# Patient Record
Sex: Male | Born: 1950
Health system: Southern US, Community
[De-identification: ages and names within clinical notes are randomized; demographics above are authoritative.]

## PROBLEM LIST (undated history)

## (undated) DIAGNOSIS — E291 Testicular hypofunction: Principal | ICD-10-CM

## (undated) DIAGNOSIS — E785 Hyperlipidemia, unspecified: Secondary | ICD-10-CM

## (undated) DIAGNOSIS — K635 Polyp of colon: Secondary | ICD-10-CM

## (undated) DIAGNOSIS — H409 Unspecified glaucoma: Secondary | ICD-10-CM

## (undated) DIAGNOSIS — T7840XA Allergy, unspecified, initial encounter: Secondary | ICD-10-CM

## (undated) DIAGNOSIS — R011 Cardiac murmur, unspecified: Secondary | ICD-10-CM

## (undated) DIAGNOSIS — K219 Gastro-esophageal reflux disease without esophagitis: Secondary | ICD-10-CM

## (undated) DIAGNOSIS — I10 Essential (primary) hypertension: Secondary | ICD-10-CM

## (undated) HISTORY — DX: Gastro-esophageal reflux disease without esophagitis: K21.9

## (undated) HISTORY — DX: Allergy, unspecified, initial encounter: T78.40XA

## (undated) HISTORY — DX: Unspecified glaucoma: H40.9

## (undated) HISTORY — DX: Essential (primary) hypertension: I10

## (undated) HISTORY — DX: Testicular hypofunction: E29.1

## (undated) HISTORY — PX: ACHILLES TENDON REPAIR: SUR1153

## (undated) HISTORY — DX: Polyp of colon: K63.5

## (undated) HISTORY — DX: Hyperlipidemia, unspecified: E78.5

## (undated) HISTORY — DX: Cardiac murmur, unspecified: R01.1

---

## 1998-03-24 ENCOUNTER — Ambulatory Visit (HOSPITAL_COMMUNITY): Admission: RE | Admit: 1998-03-24 | Discharge: 1998-03-24 | Payer: Self-pay | Admitting: Obstetrics and Gynecology

## 1998-10-24 ENCOUNTER — Ambulatory Visit (HOSPITAL_BASED_OUTPATIENT_CLINIC_OR_DEPARTMENT_OTHER): Admission: RE | Admit: 1998-10-24 | Discharge: 1998-10-24 | Payer: Self-pay | Admitting: Urology

## 2001-11-03 ENCOUNTER — Encounter: Payer: Self-pay | Admitting: Internal Medicine

## 2004-09-14 ENCOUNTER — Encounter: Payer: Self-pay | Admitting: Internal Medicine

## 2004-10-15 LAB — HM COLONOSCOPY: HM Colonoscopy: NORMAL

## 2005-06-18 ENCOUNTER — Encounter: Admission: RE | Admit: 2005-06-18 | Discharge: 2005-06-18 | Payer: Self-pay | Admitting: Family Medicine

## 2005-07-12 ENCOUNTER — Encounter: Admission: RE | Admit: 2005-07-12 | Discharge: 2005-08-31 | Payer: Self-pay | Admitting: Family Medicine

## 2005-10-28 ENCOUNTER — Encounter: Payer: Self-pay | Admitting: Internal Medicine

## 2006-02-08 ENCOUNTER — Ambulatory Visit: Payer: Self-pay | Admitting: Family Medicine

## 2006-02-08 LAB — CONVERTED CEMR LAB
ALT: 36 units/L (ref 0–40)
AST: 28 units/L (ref 0–37)
BUN: 14 mg/dL (ref 6–23)
CO2: 32 meq/L (ref 19–32)
Chloride: 104 meq/L (ref 96–112)
Creatinine, Ser: 1.1 mg/dL (ref 0.4–1.5)
GFR calc non Af Amer: 74 mL/min
Glomerular Filtration Rate, Af Am: 89 mL/min/{1.73_m2}
Hgb A1c MFr Bld: 5.6 % (ref 4.6–6.0)
Microalb Creat Ratio: 10.1 mg/g (ref 0.0–30.0)
Microalb, Ur: 1 mg/dL (ref 0.0–1.9)
Potassium: 3.8 meq/L (ref 3.5–5.1)
VLDL: 11 mg/dL (ref 0–40)

## 2006-09-13 DIAGNOSIS — I10 Essential (primary) hypertension: Secondary | ICD-10-CM | POA: Insufficient documentation

## 2006-09-13 DIAGNOSIS — J309 Allergic rhinitis, unspecified: Secondary | ICD-10-CM

## 2006-09-13 DIAGNOSIS — E119 Type 2 diabetes mellitus without complications: Secondary | ICD-10-CM

## 2007-02-01 ENCOUNTER — Telehealth (INDEPENDENT_AMBULATORY_CARE_PROVIDER_SITE_OTHER): Payer: Self-pay | Admitting: *Deleted

## 2007-02-16 ENCOUNTER — Telehealth (INDEPENDENT_AMBULATORY_CARE_PROVIDER_SITE_OTHER): Payer: Self-pay | Admitting: *Deleted

## 2007-02-20 ENCOUNTER — Telehealth (INDEPENDENT_AMBULATORY_CARE_PROVIDER_SITE_OTHER): Payer: Self-pay | Admitting: *Deleted

## 2007-03-09 ENCOUNTER — Telehealth (INDEPENDENT_AMBULATORY_CARE_PROVIDER_SITE_OTHER): Payer: Self-pay | Admitting: *Deleted

## 2007-03-13 ENCOUNTER — Telehealth (INDEPENDENT_AMBULATORY_CARE_PROVIDER_SITE_OTHER): Payer: Self-pay | Admitting: *Deleted

## 2007-04-06 ENCOUNTER — Telehealth (INDEPENDENT_AMBULATORY_CARE_PROVIDER_SITE_OTHER): Payer: Self-pay | Admitting: *Deleted

## 2007-04-11 ENCOUNTER — Telehealth (INDEPENDENT_AMBULATORY_CARE_PROVIDER_SITE_OTHER): Payer: Self-pay | Admitting: *Deleted

## 2007-04-12 ENCOUNTER — Telehealth (INDEPENDENT_AMBULATORY_CARE_PROVIDER_SITE_OTHER): Payer: Self-pay | Admitting: *Deleted

## 2007-05-03 ENCOUNTER — Telehealth (INDEPENDENT_AMBULATORY_CARE_PROVIDER_SITE_OTHER): Payer: Self-pay | Admitting: *Deleted

## 2007-05-08 ENCOUNTER — Telehealth (INDEPENDENT_AMBULATORY_CARE_PROVIDER_SITE_OTHER): Payer: Self-pay | Admitting: *Deleted

## 2007-05-09 ENCOUNTER — Encounter: Payer: Self-pay | Admitting: Internal Medicine

## 2007-05-17 ENCOUNTER — Telehealth (INDEPENDENT_AMBULATORY_CARE_PROVIDER_SITE_OTHER): Payer: Self-pay | Admitting: *Deleted

## 2007-06-02 ENCOUNTER — Telehealth (INDEPENDENT_AMBULATORY_CARE_PROVIDER_SITE_OTHER): Payer: Self-pay | Admitting: *Deleted

## 2007-06-08 ENCOUNTER — Telehealth (INDEPENDENT_AMBULATORY_CARE_PROVIDER_SITE_OTHER): Payer: Self-pay | Admitting: *Deleted

## 2007-06-09 ENCOUNTER — Telehealth (INDEPENDENT_AMBULATORY_CARE_PROVIDER_SITE_OTHER): Payer: Self-pay | Admitting: *Deleted

## 2007-06-09 ENCOUNTER — Encounter: Payer: Self-pay | Admitting: Internal Medicine

## 2007-06-13 ENCOUNTER — Encounter: Payer: Self-pay | Admitting: Internal Medicine

## 2007-06-14 ENCOUNTER — Telehealth (INDEPENDENT_AMBULATORY_CARE_PROVIDER_SITE_OTHER): Payer: Self-pay | Admitting: *Deleted

## 2007-06-21 ENCOUNTER — Telehealth (INDEPENDENT_AMBULATORY_CARE_PROVIDER_SITE_OTHER): Payer: Self-pay | Admitting: *Deleted

## 2007-07-19 ENCOUNTER — Encounter: Payer: Self-pay | Admitting: Internal Medicine

## 2007-08-02 ENCOUNTER — Telehealth (INDEPENDENT_AMBULATORY_CARE_PROVIDER_SITE_OTHER): Payer: Self-pay | Admitting: *Deleted

## 2007-09-04 ENCOUNTER — Encounter: Payer: Self-pay | Admitting: Internal Medicine

## 2007-11-09 ENCOUNTER — Ambulatory Visit: Payer: Self-pay | Admitting: Internal Medicine

## 2007-11-09 DIAGNOSIS — Z860100 Personal history of colon polyps, unspecified: Secondary | ICD-10-CM | POA: Insufficient documentation

## 2007-11-09 DIAGNOSIS — E785 Hyperlipidemia, unspecified: Secondary | ICD-10-CM

## 2007-11-09 LAB — CONVERTED CEMR LAB
Basophils Absolute: 0 10*3/uL (ref 0.0–0.1)
Basophils Relative: 1.2 % (ref 0.0–3.0)
CO2: 31 meq/L (ref 19–32)
Calcium: 9.5 mg/dL (ref 8.4–10.5)
Chloride: 100 meq/L (ref 96–112)
Creatinine,U: 78.4 mg/dL
Glucose, Bld: 93 mg/dL (ref 70–99)
HCT: 42.3 % (ref 39.0–52.0)
Hemoglobin: 14.7 g/dL (ref 13.0–17.0)
Hgb A1c MFr Bld: 5.8 % (ref 4.6–6.0)
Lymphocytes Relative: 28.9 % (ref 12.0–46.0)
MCHC: 34.9 g/dL (ref 30.0–36.0)
Neutro Abs: 2.3 10*3/uL (ref 1.4–7.7)
Platelets: 184 10*3/uL (ref 150–400)
RDW: 13.3 % (ref 11.5–14.6)
VLDL: 12 mg/dL (ref 0–40)

## 2007-11-10 ENCOUNTER — Encounter: Payer: Self-pay | Admitting: Internal Medicine

## 2007-11-10 ENCOUNTER — Telehealth: Payer: Self-pay | Admitting: Internal Medicine

## 2007-12-11 ENCOUNTER — Ambulatory Visit: Payer: Self-pay | Admitting: Internal Medicine

## 2007-12-11 DIAGNOSIS — E876 Hypokalemia: Secondary | ICD-10-CM

## 2007-12-11 DIAGNOSIS — M79609 Pain in unspecified limb: Secondary | ICD-10-CM | POA: Insufficient documentation

## 2007-12-11 LAB — CONVERTED CEMR LAB
Calcium: 9.3 mg/dL (ref 8.4–10.5)
Chloride: 106 meq/L (ref 96–112)
GFR calc non Af Amer: 82 mL/min
Glucose, Bld: 126 mg/dL — ABNORMAL HIGH (ref 70–99)
Potassium: 3.7 meq/L (ref 3.5–5.1)
Sodium: 144 meq/L (ref 135–145)

## 2007-12-12 ENCOUNTER — Telehealth: Payer: Self-pay | Admitting: Internal Medicine

## 2007-12-25 ENCOUNTER — Encounter: Payer: Self-pay | Admitting: Internal Medicine

## 2007-12-29 ENCOUNTER — Encounter: Payer: Self-pay | Admitting: Internal Medicine

## 2008-01-05 ENCOUNTER — Telehealth: Payer: Self-pay | Admitting: Internal Medicine

## 2008-01-12 ENCOUNTER — Telehealth: Payer: Self-pay | Admitting: Internal Medicine

## 2008-01-13 ENCOUNTER — Ambulatory Visit: Payer: Self-pay | Admitting: Family Medicine

## 2008-01-13 DIAGNOSIS — J069 Acute upper respiratory infection, unspecified: Secondary | ICD-10-CM | POA: Insufficient documentation

## 2008-01-16 ENCOUNTER — Ambulatory Visit (HOSPITAL_BASED_OUTPATIENT_CLINIC_OR_DEPARTMENT_OTHER): Admission: RE | Admit: 2008-01-16 | Discharge: 2008-01-16 | Payer: Self-pay | Admitting: Internal Medicine

## 2008-01-16 ENCOUNTER — Telehealth: Payer: Self-pay | Admitting: Internal Medicine

## 2008-01-16 ENCOUNTER — Ambulatory Visit: Payer: Self-pay | Admitting: Internal Medicine

## 2008-01-16 DIAGNOSIS — R079 Chest pain, unspecified: Secondary | ICD-10-CM

## 2008-01-22 ENCOUNTER — Ambulatory Visit: Payer: Self-pay | Admitting: Internal Medicine

## 2008-02-19 ENCOUNTER — Ambulatory Visit: Payer: Self-pay | Admitting: Internal Medicine

## 2008-02-19 ENCOUNTER — Inpatient Hospital Stay (HOSPITAL_COMMUNITY): Admission: EM | Admit: 2008-02-19 | Discharge: 2008-02-21 | Payer: Self-pay | Admitting: Emergency Medicine

## 2008-02-28 ENCOUNTER — Ambulatory Visit: Payer: Self-pay | Admitting: Internal Medicine

## 2008-02-28 DIAGNOSIS — N453 Epididymo-orchitis: Secondary | ICD-10-CM | POA: Insufficient documentation

## 2008-03-01 ENCOUNTER — Telehealth: Payer: Self-pay | Admitting: Internal Medicine

## 2008-03-05 ENCOUNTER — Ambulatory Visit: Payer: Self-pay | Admitting: Internal Medicine

## 2008-03-13 ENCOUNTER — Telehealth (INDEPENDENT_AMBULATORY_CARE_PROVIDER_SITE_OTHER): Payer: Self-pay | Admitting: *Deleted

## 2008-03-14 ENCOUNTER — Ambulatory Visit: Payer: Self-pay | Admitting: Internal Medicine

## 2008-03-14 DIAGNOSIS — J329 Chronic sinusitis, unspecified: Secondary | ICD-10-CM | POA: Insufficient documentation

## 2008-04-01 ENCOUNTER — Telehealth: Payer: Self-pay | Admitting: Internal Medicine

## 2008-04-02 ENCOUNTER — Ambulatory Visit: Payer: Self-pay | Admitting: Internal Medicine

## 2008-04-02 DIAGNOSIS — M542 Cervicalgia: Secondary | ICD-10-CM | POA: Insufficient documentation

## 2008-04-29 ENCOUNTER — Ambulatory Visit: Payer: Self-pay | Admitting: Internal Medicine

## 2008-04-29 LAB — CONVERTED CEMR LAB
CO2: 31 meq/L (ref 19–32)
Calcium: 9.6 mg/dL (ref 8.4–10.5)
Chloride: 101 meq/L (ref 96–112)
Creatinine, Ser: 0.8 mg/dL (ref 0.4–1.5)
Creatinine,U: 63.4 mg/dL
GFR calc Af Amer: 128 mL/min
Glucose, Bld: 185 mg/dL — ABNORMAL HIGH (ref 70–99)
Hgb A1c MFr Bld: 7 % — ABNORMAL HIGH (ref 4.6–6.0)
Microalb Creat Ratio: 11 mg/g (ref 0.0–30.0)

## 2008-05-02 ENCOUNTER — Ambulatory Visit: Payer: Self-pay | Admitting: Internal Medicine

## 2008-07-19 ENCOUNTER — Telehealth: Payer: Self-pay | Admitting: Internal Medicine

## 2008-07-29 ENCOUNTER — Ambulatory Visit: Payer: Self-pay | Admitting: Internal Medicine

## 2008-07-29 DIAGNOSIS — R05 Cough: Secondary | ICD-10-CM

## 2008-09-26 ENCOUNTER — Ambulatory Visit: Payer: Self-pay | Admitting: Internal Medicine

## 2008-09-26 LAB — CONVERTED CEMR LAB
ALT: 28 units/L (ref 0–53)
Chloride: 105 meq/L (ref 96–112)
Cholesterol: 145 mg/dL (ref 0–200)
Creatinine, Ser: 0.9 mg/dL (ref 0.4–1.5)
Hgb A1c MFr Bld: 6.4 % (ref 4.6–6.5)
Sodium: 143 meq/L (ref 135–145)
Total CHOL/HDL Ratio: 3
Triglycerides: 91 mg/dL (ref 0.0–149.0)

## 2008-09-30 ENCOUNTER — Ambulatory Visit: Payer: Self-pay | Admitting: Internal Medicine

## 2009-01-08 ENCOUNTER — Telehealth: Payer: Self-pay | Admitting: Internal Medicine

## 2009-01-09 ENCOUNTER — Ambulatory Visit: Payer: Self-pay | Admitting: Internal Medicine

## 2009-01-09 DIAGNOSIS — J209 Acute bronchitis, unspecified: Secondary | ICD-10-CM

## 2009-03-24 IMAGING — CR DG CHEST 2V
2 series · 2 of 2 positions shown · non-contrast
Comparison: None

CLINICAL DATA: Cough.  Chest pain.  Upper respiratory infection.

CHEST - 2 VIEW

[w chest pa]
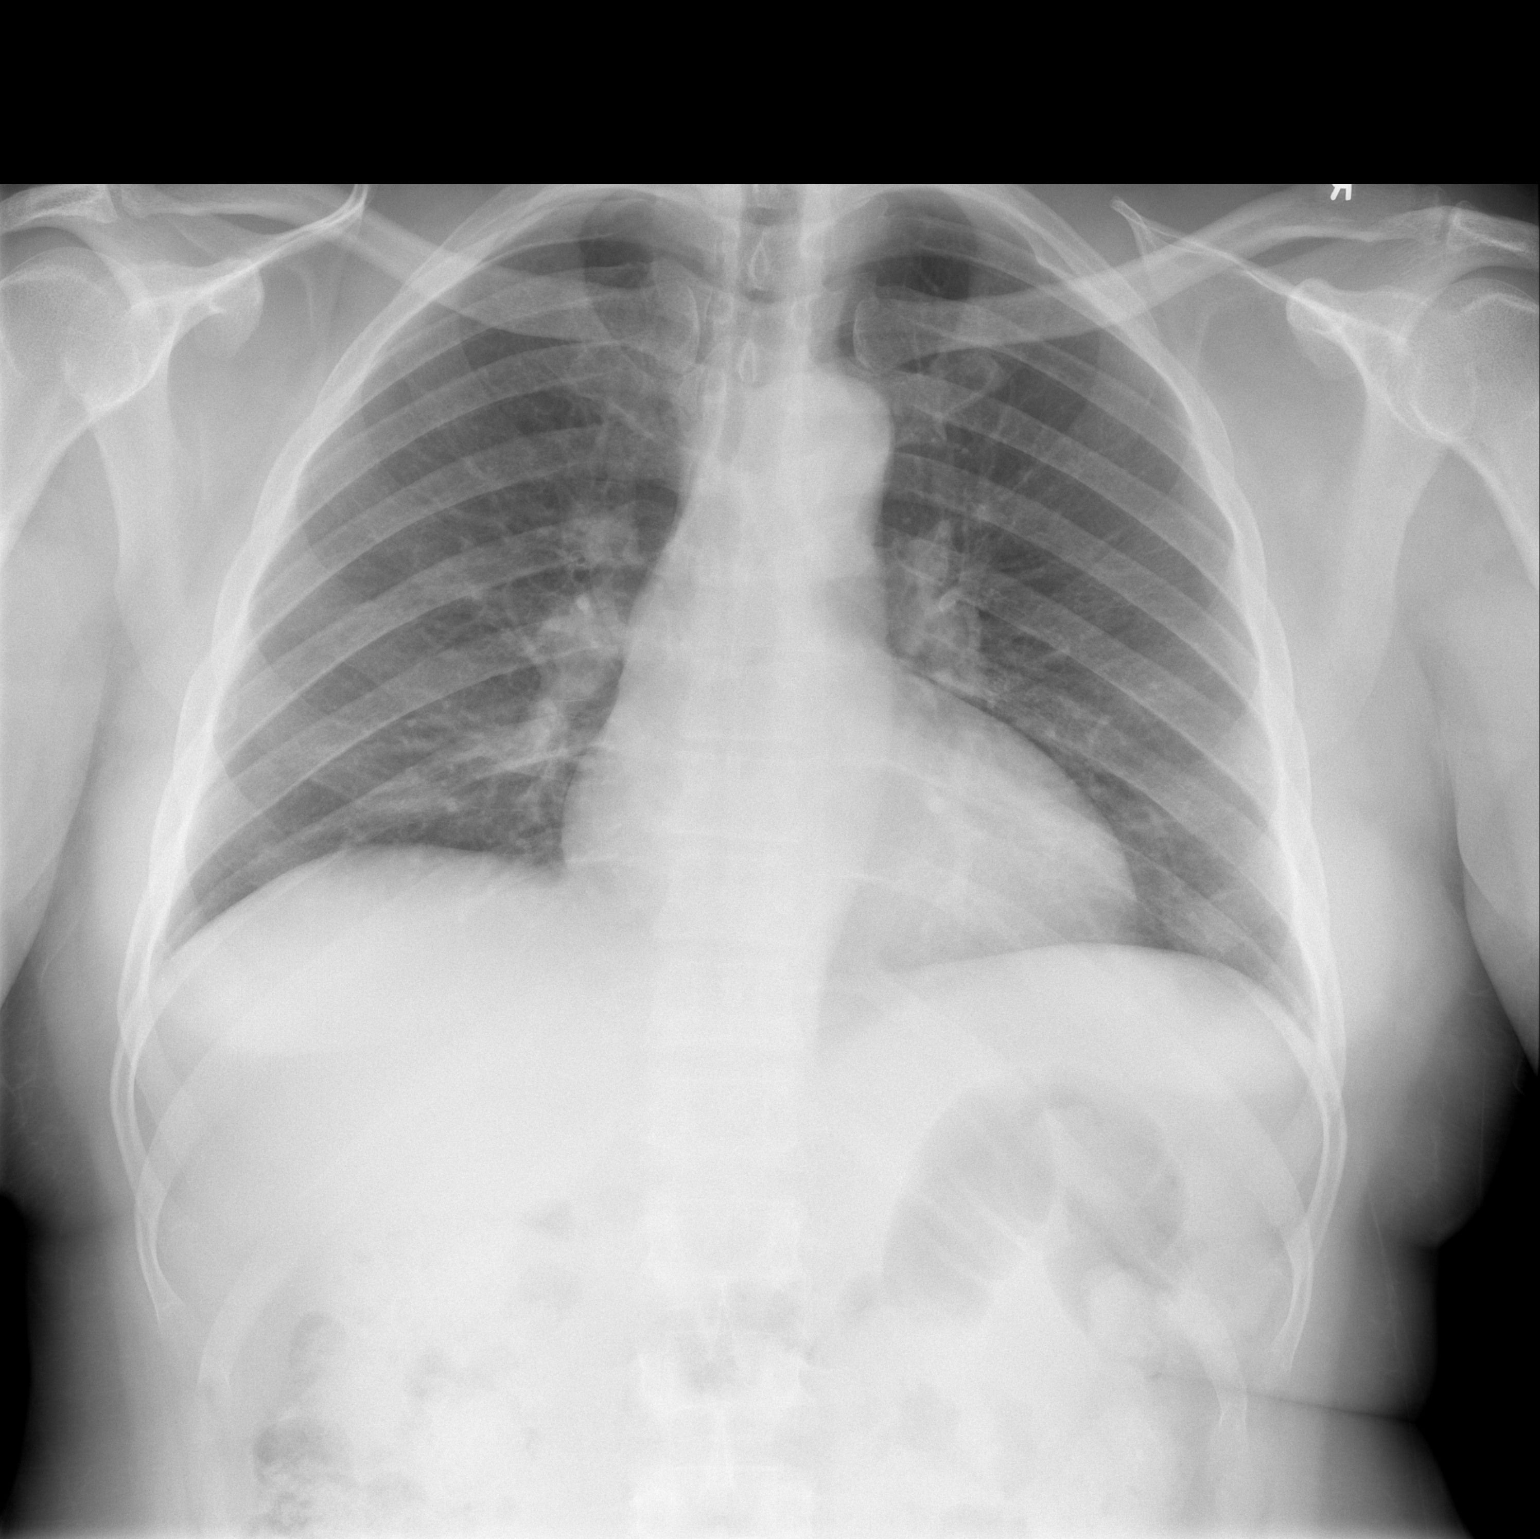

[w chest lat]
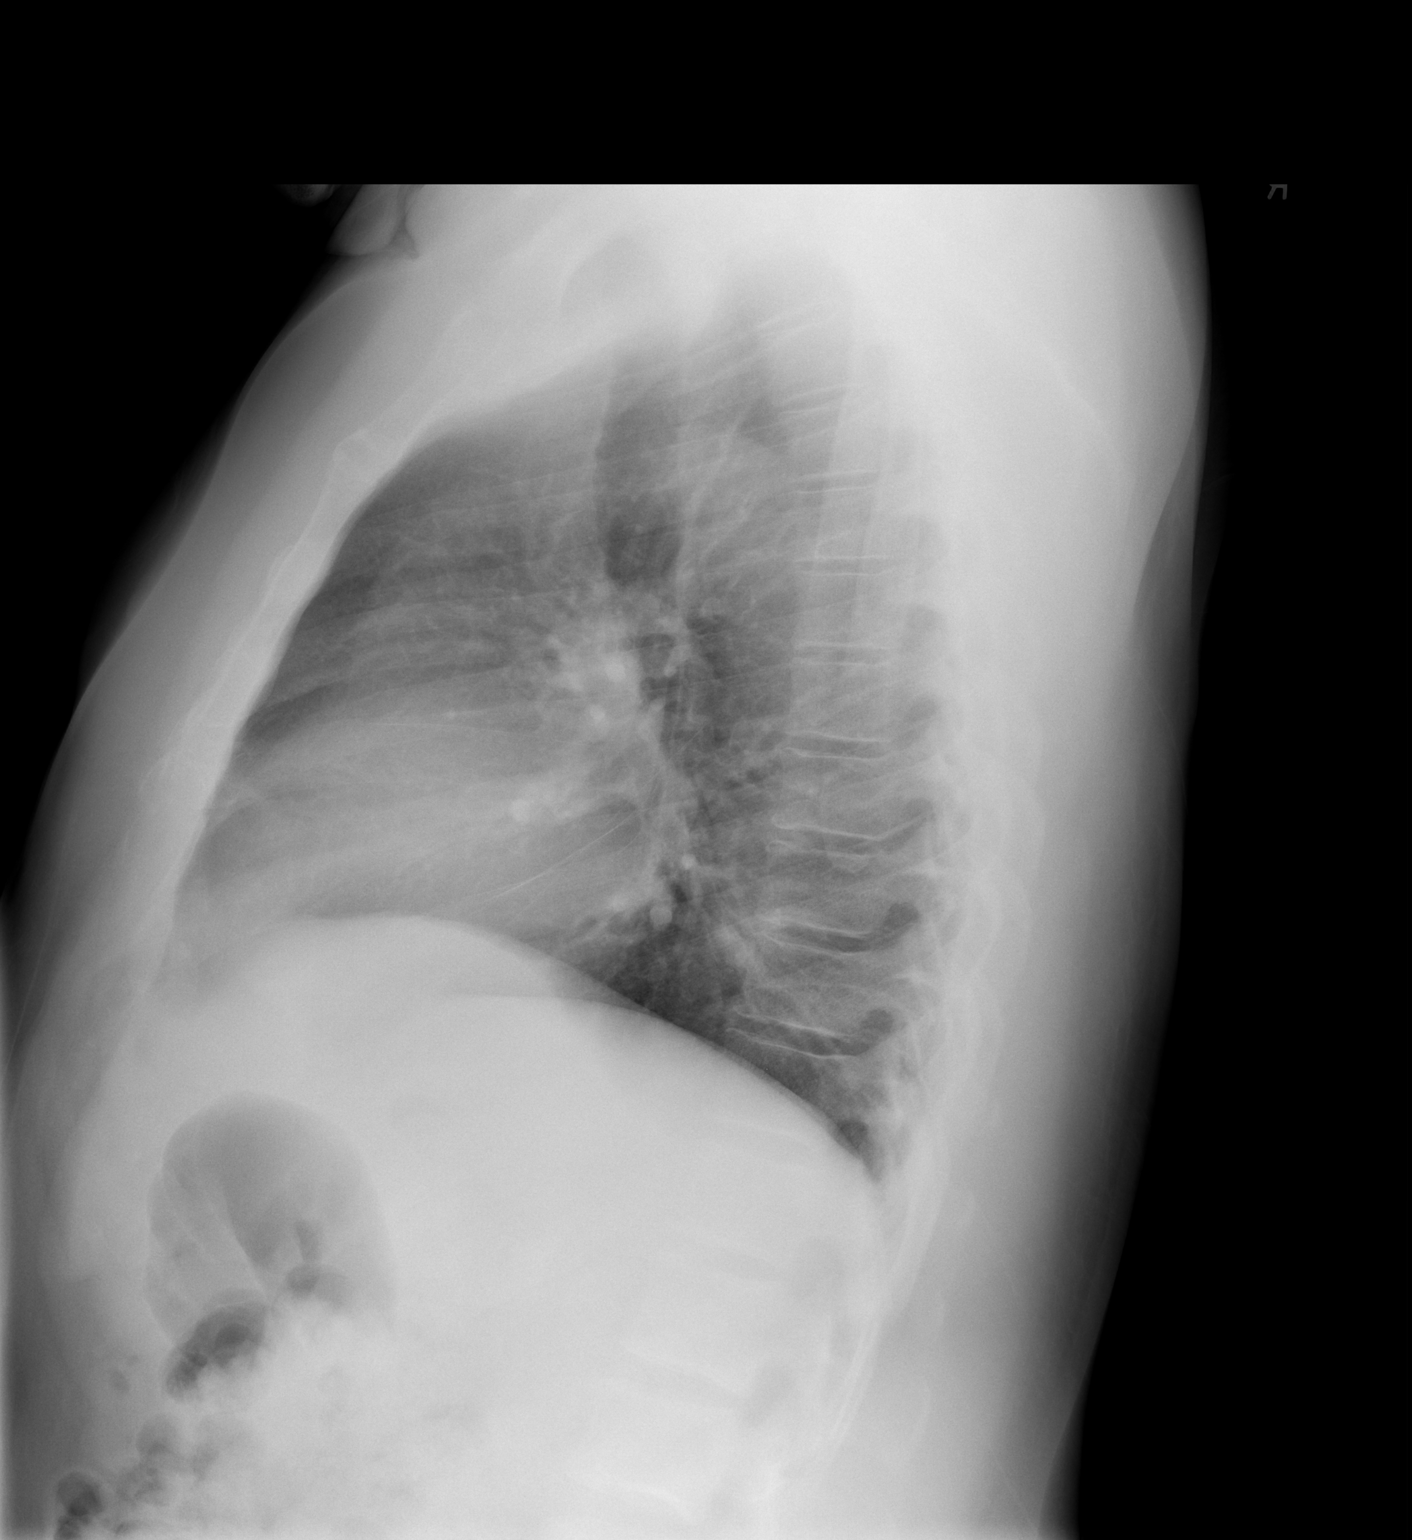

[2 of 2 positions shown; findings below may reference images not displayed]

FINDINGS: Low lung volumes are noted, however both lungs are clear.
Heart size is within normal limits.  There is no evidence of
pleural effusion.  No mass or adenopathy identified.  Visualized
skeletal structures are unremarkable.
IMPRESSION: No active cardiopulmonary disease.

## 2009-03-25 ENCOUNTER — Ambulatory Visit: Payer: Self-pay | Admitting: Internal Medicine

## 2009-03-25 LAB — CONVERTED CEMR LAB
BUN: 15 mg/dL (ref 6–23)
Creatinine, Ser: 0.97 mg/dL (ref 0.40–1.50)
Hgb A1c MFr Bld: 13.3 % — ABNORMAL HIGH (ref 4.6–6.1)
Potassium: 3.7 meq/L (ref 3.5–5.3)

## 2009-04-07 ENCOUNTER — Ambulatory Visit: Payer: Self-pay | Admitting: Internal Medicine

## 2009-04-07 LAB — CONVERTED CEMR LAB: Blood Glucose, Fingerstick: 371

## 2009-05-02 ENCOUNTER — Telehealth: Payer: Self-pay | Admitting: Internal Medicine

## 2009-05-09 ENCOUNTER — Ambulatory Visit: Payer: Self-pay | Admitting: Internal Medicine

## 2009-06-06 ENCOUNTER — Telehealth: Payer: Self-pay | Admitting: Internal Medicine

## 2009-06-06 ENCOUNTER — Ambulatory Visit: Payer: Self-pay | Admitting: Internal Medicine

## 2009-06-06 LAB — CONVERTED CEMR LAB
BUN: 19 mg/dL (ref 6–23)
CO2: 24 meq/L (ref 19–32)
Chloride: 104 meq/L (ref 96–112)
Creatinine, Ser: 0.89 mg/dL (ref 0.40–1.50)
Creatinine, Urine: 84.5 mg/dL
Microalb Creat Ratio: 18.5 mg/g (ref 0.0–30.0)
Sodium: 144 meq/L (ref 135–145)

## 2009-07-11 ENCOUNTER — Ambulatory Visit: Payer: Self-pay | Admitting: Internal Medicine

## 2009-07-22 ENCOUNTER — Telehealth: Payer: Self-pay | Admitting: Internal Medicine

## 2009-08-22 ENCOUNTER — Telehealth: Payer: Self-pay | Admitting: Internal Medicine

## 2009-09-23 ENCOUNTER — Telehealth: Payer: Self-pay | Admitting: Internal Medicine

## 2009-10-20 ENCOUNTER — Telehealth: Payer: Self-pay | Admitting: Internal Medicine

## 2009-10-23 ENCOUNTER — Telehealth: Payer: Self-pay | Admitting: Internal Medicine

## 2009-10-31 ENCOUNTER — Ambulatory Visit: Payer: Self-pay | Admitting: Internal Medicine

## 2009-10-31 LAB — CONVERTED CEMR LAB
CO2: 28 meq/L (ref 19–32)
Calcium: 9.3 mg/dL (ref 8.4–10.5)
Glucose, Bld: 106 mg/dL — ABNORMAL HIGH (ref 70–99)
Hgb A1c MFr Bld: 5.9 % — ABNORMAL HIGH (ref <5.7)

## 2009-11-07 ENCOUNTER — Ambulatory Visit: Payer: Self-pay | Admitting: Internal Medicine

## 2009-11-27 ENCOUNTER — Encounter: Payer: Self-pay | Admitting: Internal Medicine

## 2009-12-26 ENCOUNTER — Telehealth: Payer: Self-pay | Admitting: Internal Medicine

## 2010-01-13 ENCOUNTER — Telehealth: Payer: Self-pay | Admitting: Internal Medicine

## 2010-01-16 ENCOUNTER — Telehealth: Payer: Self-pay | Admitting: Internal Medicine

## 2010-02-11 ENCOUNTER — Encounter: Payer: Self-pay | Admitting: Internal Medicine

## 2010-02-11 LAB — HM DIABETES EYE EXAM: HM Diabetic Eye Exam: NORMAL

## 2010-02-19 ENCOUNTER — Encounter: Payer: Self-pay | Admitting: Internal Medicine

## 2010-02-19 ENCOUNTER — Telehealth: Payer: Self-pay | Admitting: Internal Medicine

## 2010-02-23 ENCOUNTER — Encounter: Payer: Self-pay | Admitting: Internal Medicine

## 2010-04-16 ENCOUNTER — Telehealth: Payer: Self-pay | Admitting: Internal Medicine

## 2010-05-06 ENCOUNTER — Encounter: Payer: Self-pay | Admitting: Internal Medicine

## 2010-05-06 LAB — CONVERTED CEMR LAB
CO2: 26 meq/L (ref 19–32)
Calcium: 10 mg/dL (ref 8.4–10.5)
Chloride: 105 meq/L (ref 96–112)
Creatinine, Ser: 1 mg/dL (ref 0.40–1.50)
Glucose, Bld: 120 mg/dL — ABNORMAL HIGH (ref 70–99)
Hgb A1c MFr Bld: 6.6 % — ABNORMAL HIGH (ref <5.7)

## 2010-05-08 ENCOUNTER — Ambulatory Visit
Admission: RE | Admit: 2010-05-08 | Discharge: 2010-05-08 | Payer: Self-pay | Source: Home / Self Care | Attending: Internal Medicine | Admitting: Internal Medicine

## 2010-05-11 ENCOUNTER — Encounter: Payer: Self-pay | Admitting: Internal Medicine

## 2010-05-11 ENCOUNTER — Telehealth: Payer: Self-pay | Admitting: Internal Medicine

## 2010-05-22 ENCOUNTER — Telehealth: Payer: Self-pay | Admitting: Internal Medicine

## 2010-05-25 ENCOUNTER — Telehealth: Payer: Self-pay | Admitting: Internal Medicine

## 2010-05-28 NOTE — Progress Notes (Signed)
Summary: pharmacy clarification  Phone Note Outgoing Call   Call placed by: Mervin Kung, CMA (AAMA) Call placed to: Patient Summary of Call: Left message for pt to return my call.  Received refill request from [facility] for Potassium. Refill sent to [facility] on 11/07/09 #90 x 3 refills.  Which pharmacy does pt want to use? Need to cancel refills at [facility] if he wants to use [facility]. Nicki Guadalajara Fergerson CMA Duncan Dull)  December 26, 2009 8:41 AM   Follow-up for Phone Call        Pt returned my call and verified that he wants to get rx at [facility]. Refills sent to [facility].  Spoke to [place] at [facility] and cancelled rx.  Nicki Guadalajara Fergerson CMA Duncan Dull)  December 26, 2009 8:49 AM        Appended Document: pharmacy clarification Rx called to [facility] @ [facility]. #90 x 1 rf.

## 2010-05-28 NOTE — Progress Notes (Signed)
Summary: Actos samples   Phone Note Call from Patient   Caller: Patient Call For: D. Thomos Lemons DO Summary of Call: patient needs some samples of actos  [phone] Initial call taken by: Roselle Locus,  April 16, 2010 11:22 AM  Follow-up for Phone Call        call returned to patient at [phone], no answer. A detailied voice message was left informing patient samples left at front desk for patient pick up Follow-up by: Glendell Docker CMA,  April 16, 2010 11:30 AM    New/Updated Medications: ACTOS 45 MG TABS (PIOGLITAZONE HCL) Take 1 tablet by mouth once a day Prescriptions: ACTOS 45 MG TABS (PIOGLITAZONE HCL) Take 1 tablet by mouth once a day  #30 x 0   Entered by:   Glendell Docker CMA   Authorized by:   D. Thomos Lemons DO   Signed by:   Glendell Docker CMA on 04/16/2010   Method used:   Samples Given   RxID:   [id number]

## 2010-05-28 NOTE — Miscellaneous (Signed)
Summary: diabetic eye exam  Clinical Lists Changes  Observations: Added new observation of DMEYEEXAMNXT: 02/2011 (02/19/2010 13:42) Added new observation of DMEYEEXMRES: normal (02/11/2010 13:43) Added new observation of EYE EXAM BY: [facility], Dr. Annett Gula (02/11/2010 13:43) Added new observation of DIAB EYE EX: normal (02/11/2010 13:43)        Diabetes Management Exam:    Eye Exam:       Eye Exam done elsewhere          Date: 02/11/2010          Results: normal          Done by: [facility], Dr. Annett Gula

## 2010-05-28 NOTE — Progress Notes (Signed)
Summary: Elevated blood sugars  Phone Note Call from Patient Call back at [facility] Phone [phone]   Caller: Patient Call For: D. Thomos Lemons DO Summary of Call: patient called and left voice message stating his blood sugars have been elevated ranging in the 130's. He would like to know if his blood sugars are okay where they are at or if he should restart the Glimepiride Initial call taken by: Glendell Docker CMA,  January 16, 2010 4:26 PM  Follow-up for Phone Call        no,  cont other meds,  low carb diet and exercise if blood sugars continue to rise,  instruct pt call back in 2 wks Follow-up by: D. Thomos Lemons DO,  January 18, 2010 8:03 PM  Additional Follow-up for Phone Call Additional follow up Details #1::        call returned to patient at [phone], no answer. A detailed voice message was left informing patient per Dr Artist Pais instructions Additional Follow-up by: Glendell Docker CMA,  January 19, 2010 8:43 AM

## 2010-05-28 NOTE — Progress Notes (Signed)
Summary: Metformin Refill & Lab Orders  Phone Note Refill Request Message from:  Fax from Pharmacy on Sep 23, 2009 10:03 AM  Refills Requested: Medication #1:  METFORMIN HCL 500 MG XR24H-TAB 2 by mouth two times a day   Dosage confirmed as above?Dosage Confirmed   Brand Name Necessary? No   Supply Requested: 3 months   Last Refilled: 08/06/2009  Method Requested: Electronic Next Appointment Scheduled: 11/07/2009 @ 3:45P Initial call taken by: Glendell Docker CMA,  Sep 23, 2009 10:03 AM  Follow-up for Phone Call        Rx completed in Dr. Tiajuana Amass Follow-up by: Glendell Docker CMA,  Sep 23, 2009 10:04 AM    Prescriptions: METFORMIN HCL 500 MG XR24H-TAB (METFORMIN HCL) 2 by mouth two times a day  #360 x 3   Entered by:   Glendell Docker CMA   Authorized by:   D. Thomos Lemons DO   Signed by:   Glendell Docker CMA on 09/23/2009   Method used:   Electronically to        [facility] [address] (retail)       [address]       [place], [place]  [address]       Ph: [phone]       Fax: [phone]   RxID:   [id number]

## 2010-05-28 NOTE — Assessment & Plan Note (Signed)
Summary: 6 month follow up/mhf   Vital Signs:  Patient profile:   60 year old male Height:      64 inches Weight:      197 pounds BMI:     33.94 O2 Sat:      100 % on Room air Temp:     97.5 degrees F oral Pulse rate:   55 / minute Resp:     18 per minute BP sitting:   132 / 80  (left arm) Cuff size:   large  Vitals Entered By: Glendell Docker CMA (May 08, 2010 3:51 PM)  O2 Flow:  Room air CC: 6 Month Follow up , Type 2 diabetes mellitus follow-up Is Patient Diabetic? Yes Pain Assessment Patient in pain? no      Comments low blood sugar 108, high 144, avg 120's ,    Primary Care Provider:  Dondra Spry DO  CC:  6 Month Follow up  and Type 2 diabetes mellitus follow-up.  History of Present Illness:  Hypertension Follow-Up      This is a 60 year old man who presents for Hypertension follow-up.  The patient reports edema, but denies lightheadedness.  The patient denies the following associated symptoms: chest pain and chest pressure.  Compliance with medications (by patient report) has been near 100%.  The patient reports that dietary compliance has been fair.    Type 2 Diabetes Mellitus Follow-Up      The patient is also here for Type 2 diabetes mellitus follow-up.  The patient reports weight gain, but denies self managed hypoglycemia and hypoglycemia requiring help.  The patient denies the following symptoms: chest pain.  Since the last visit the patient reports monitoring blood glucose.  less exercise.  diet worse over holidays   Preventive Screening-Counseling & Management  Alcohol-Tobacco     Smoking Status: never  Allergies (verified): No Known Drug Allergies  Past History:  Past Medical History: Allergic rhinitis Diabetes mellitus, type II    Hyperlipidemia    Colonic polyps, hx of     Left epididymitis/orchitis with left hydrocele 01/2008      Family History: Mother deceased 39's - seizure Father deceased ? medical hx Brother - stomach  cancer Colon cancer - no             Social History: Occupation:  unemployed Married -  since 1999 no children  Never Smoked   Alcohol use-no   Drug use-no            Physical Exam  General:  alert, well-developed, and well-nourished.   Neck:  supple, no masses, and no neck tenderness.   Lungs:  normal respiratory effort and normal breath sounds.   Heart:  normal rate, regular rhythm, and no gallop.   Extremities:  trace left pedal edema and trace right pedal edema.    Diabetes Management Exam:    Foot Exam (with socks and/or shoes not present):       Inspection:          Left foot: normal          Right foot: normal   Impression & Recommendations:  Problem # 1:  DIABETES MELLITUS, TYPE II (ICD-250.00) Assessment Deteriorated fluid retention with Actos.  DC actos  due to concerns for higher risk for bladder cancer switch to tradjenta  The following medications were removed from the medication list:    Actos 45 Mg Tabs (Pioglitazone hcl) .Marland Kitchen... Take 1 tablet by mouth once a day  His updated medication list for this problem includes:    Lisinopril-hydrochlorothiazide 20-25 Mg Tabs (Lisinopril-hydrochlorothiazide) ..... One by mouth once daily    Metformin Hcl 500 Mg Xr24h-tab (Metformin hcl) .Marland Kitchen... 2 by mouth two times a day    Aspirin Low Dose 81 Mg Tabs (Aspirin) .Marland Kitchen... Take 1 tablet by mouth once a day    Tradjenta 5 Mg Tabs (Linagliptin) ..... One by mouth once daily  Labs Reviewed: Creat: 1.00 (05/06/2010)     Last Eye Exam: normal (02/11/2010) Reviewed HgBA1c results: 6.6 (05/06/2010)  5.9 (10/31/2009)  Problem # 2:  HYPERTENSION (ICD-401.9) Assessment: Unchanged  His updated medication list for this problem includes:    Lisinopril-hydrochlorothiazide 20-25 Mg Tabs (Lisinopril-hydrochlorothiazide) ..... One by mouth once daily    Labetalol Hcl 200 Mg Tabs (Labetalol hcl) .Marland Kitchen... Take one tablet by mouth twice daily    Dilt-cd 240 Mg Xr24h-cap (Diltiazem hcl  coated beads) ..... One by mouth qd    Catapres 0.1 Mg Tabs (Clonidine hcl) ..... One tab by mouth two times a day  BP today: 132/80 Prior BP: 130/80 (11/07/2009)  Labs Reviewed: K+: 3.8 (05/06/2010) Creat: : 1.00 (05/06/2010)   Chol: 145 (09/26/2008)   HDL: 44.00 (09/26/2008)   LDL: 83 (09/26/2008)   TG: 91.0 (09/26/2008)  Complete Medication List: 1)  Lisinopril-hydrochlorothiazide 20-25 Mg Tabs (Lisinopril-hydrochlorothiazide) .... One by mouth once daily 2)  Metformin Hcl 500 Mg Xr24h-tab (Metformin hcl) .... 2 by mouth two times a day 3)  Labetalol Hcl 200 Mg Tabs (Labetalol hcl) .... Take one tablet by mouth twice daily 4)  Zocor 40 Mg Tabs (Simvastatin) .... Take one half  tablet daily 5)  Klor-con M20 20 Meq Cr-tabs (Potassium chloride crys cr) .... One by mouth once daily 6)  Dilt-cd 240 Mg Xr24h-cap (Diltiazem hcl coated beads) .... One by mouth qd 7)  Catapres 0.1 Mg Tabs (Clonidine hcl) .... One tab by mouth two times a day 8)  Omeprazole 20 Mg Tbec (Omeprazole) .... One by mouth once daily ac am meal 9)  Aspirin Low Dose 81 Mg Tabs (Aspirin) .... Take 1 tablet by mouth once a day 10)  Zyrtec Allergy 10 Mg Tabs (Cetirizine hcl) .... One by mouth qd 11)  Nasonex 50 Mcg/act Susp (Mometasone furoate) .... 2 sprays each nostril by mouth qd 12)  Proair Hfa 108 (90 Base) Mcg/act Aers (Albuterol sulfate) .... 2 puffs q 4 - 6 hrs as needed. 13)  Accu-chek Aviva Strp (Glucose blood) .... Use three times a day as directed 14)  Accu-chek Multiclix Lancets Misc (Lancets) .... Use three times a day as directed to check blood sugar 15)  Tradjenta 5 Mg Tabs (Linagliptin) .... One by mouth once daily  Patient Instructions: 1)  Please schedule a follow-up appointment in 6  months. 2)  BMP prior to visit, ICD-9: 401.9 3)  Hepatic Panel prior to visit, ICD-9: 272.4 4)  Lipid Panel prior to visit, ICD-9: 272.4 5)  HbgA1C prior to visit, ICD-9: 250.00 6)  Urine Microalbumin prior to visit,  ICD-9: 250.00 7)  Please return for lab work one (1) week before your next appointment.  Prescriptions: TRADJENTA 5 MG TABS (LINAGLIPTIN) one by mouth once daily  #90 x 1   Entered and Authorized by:   D. Thomos Lemons DO   Signed by:   D. Thomos Lemons DO on 05/08/2010   Method used:   Electronically to        [facility] # [phone]* (retail)  [address] [place], [place]  [address]       Ph: [phone]       Fax: [phone]   RxID:   [id number]    Orders Added: 1)  Est. Patient Level III [84132]     Current Allergies (reviewed today): No known allergies

## 2010-05-28 NOTE — Progress Notes (Signed)
Summary: refill--lebetalol,clonidine,lisinopril-hctz,diltiazem  Phone Note Refill Request Call back at Home Phone [phone] Message from:  Patient on May 22, 2010 1:26 PM  Refills Requested: Medication #1:  LABETALOL HCL 200 MG TABS Take one tablet by mouth twice daily   Dosage confirmed as above?Dosage Confirmed   Brand Name Necessary? No   Supply Requested: 3 months  Medication #2:  diltiabem er240 mg take 1 per day   Dosage confirmed as above?Dosage Confirmed   Brand Name Necessary? No   Supply Requested: 3 months  Medication #3:  blonidine .1 mg take 2 per day   Dosage confirmed as above?Dosage Confirmed   Brand Name Necessary? No   Supply Requested: 3 months  Medication #4:  LISINOPRIL-HYDROCHLOROTHIAZIDE 20-25 MG TABS one by mouth once daily   Dosage confirmed as above?Dosage Confirmed   Brand Name Necessary? No   Supply Requested: 3 months [facility]  [address] [place]    Method Requested: Electronic Next Appointment Scheduled: none  Initial call taken by: Roselle Locus,  May 22, 2010 1:29 PM    Prescriptions: CATAPRES 0.1 MG  TABS (CLONIDINE HCL) one tab by mouth two times a day  #180 x 1   Entered by:   Mervin Kung CMA (AAMA)   Authorized by:   D. Thomos Lemons DO   Signed by:   Mervin Kung CMA (AAMA) on 05/22/2010   Method used:   Electronically to        [facility] # [phone]* (retail)       [address] [place], [place]  [address]       Ph: [phone]       Fax: [phone]   RxID:   [id number] DILT-CD 240 MG XR24H-CAP (DILTIAZEM HCL COATED BEADS) one by mouth qd  #90 x 1   Entered by:   Mervin Kung CMA (AAMA)   Authorized by:   D. Thomos Lemons DO   Signed by:   Mervin Kung CMA (AAMA) on 05/22/2010   Method used:   Electronically to        [facility] # [phone]* (retail)       [address] [place], [place]  [address]       Ph: [phone]    Fax: [phone]   RxID:   [id number] LABETALOL HCL 200 MG TABS (LABETALOL HCL) Take one tablet by mouth twice daily  #180 x 1   Entered by:   Mervin Kung CMA (AAMA)   Authorized by:   D. Thomos Lemons DO   Signed by:   Mervin Kung CMA (AAMA) on 05/22/2010   Method used:   Electronically to        [facility] # [phone]* (retail)       [address] [place], [place]  [address]       Ph: [phone]       Fax: [phone]   RxID:   [id number] LISINOPRIL-HYDROCHLOROTHIAZIDE 20-25 MG TABS (LISINOPRIL-HYDROCHLOROTHIAZIDE) one by mouth once daily  #90 x 1   Entered by:   Mervin Kung CMA (AAMA)   Authorized by:   D. Thomos Lemons DO   Signed by:   Mervin Kung CMA (AAMA) on 05/22/2010  Method used:   Electronically to        [facility] # [phone]* (retail)       [address] [place], [place]  [address]       Ph: [phone]       Fax: [phone]   RxID:   [id number]   Appended Document: refill--lebetalol,clonidine,lisinopril-hctz,diltiazem Detailed message left on home phone that refills were completed and to call if any questions.

## 2010-05-28 NOTE — Miscellaneous (Signed)
Summary: Lab orders for January appt  Clinical Lists Changes  Orders: Added new Test order of T-Basic Metabolic Panel ([id number]) - Signed Added new Test order of T- Hemoglobin A1C (83036-23375) - Signed 

## 2010-05-28 NOTE — Progress Notes (Signed)
Summary: Potassium Refill  Phone Note Refill Request Message from:  Fax from Pharmacy on July 22, 2009 2:14 PM  Refills Requested: Medication #1:  KLOR-CON M20 20 MEQ  CR-TABS one by mouth once daily   Dosage confirmed as above?Dosage Confirmed   Brand Name Necessary? No   Last Refilled: 05/28/2009   Notes: 5 months supply 150 qty [facility] [address] [place] [address] phone [phone] fax [phone]   Method Requested: Electronic Next Appointment Scheduled: 10-31-09 lab  Initial call taken by: Roselle Locus,  July 22, 2009 2:15 PM  Follow-up for Phone Call        calll placed to patient at [phone] to verify rx refill. Pharmacy sent a request for a 5 month supply, paitent stated he only needs a 30 day supply, he might attempt mail oder Follow-up by: Glendell Docker CMA,  July 22, 2009 2:57 PM    Prescriptions: KLOR-CON M20 20 MEQ  CR-TABS (POTASSIUM CHLORIDE CRYS CR) one by mouth once daily  #30 x 1   Entered by:   Glendell Docker CMA   Authorized by:   D. Thomos Lemons DO   Signed by:   Glendell Docker CMA on 07/22/2009   Method used:   Electronically to        [facility] # [phone]* (retail)       [address] [place], [place]  [address]       Ph: [phone]       Fax: [phone]   RxID:   [id number]

## 2010-05-28 NOTE — Assessment & Plan Note (Signed)
Summary: 3 month follow up/mhf   Vital Signs:  Patient profile:   60 year old male Height:      64 inches Weight:      192.25 pounds BMI:     33.12 O2 Sat:      99 % on Room air Temp:     97.9 degrees F oral Pulse rate:   57 / minute Pulse rhythm:   regular Resp:     56 per minute BP sitting:   130 / 80  (right arm) Cuff size:   large  Vitals Entered By: Glendell Docker CMA (November 07, 2009 3:57 PM)  O2 Flow:  Room air CC: Rm 3- 3 Month follow up disease management, Type 2 diabetes mellitus follow-up Is Patient Diabetic? Yes Did you bring your meter with you today? No Pain Assessment Patient in pain? no       Does patient need assistance? Functional Status Self care Ambulation Normal Comments low blood sugar high 123, avg 90's   Primary Care Provider:  Dondra Spry DO  CC:  Rm 3- 3 Month follow up disease management and Type 2 diabetes mellitus follow-up.  History of Present Illness:  Type 2 Diabetes Mellitus Follow-Up      This is a 60 year old man who presents for Type 2 diabetes mellitus follow-up.  The patient reports self managed hypoglycemia, but denies hypoglycemia requiring help, weight loss, and weight gain.  The patient denies the following symptoms: chest pain.  Since the last visit the patient reports good dietary compliance, compliance with medications, and monitoring blood glucose.    got new job at [employer].  it keeps him physically active nephews come over to play basketball    Allergies (verified): No Known Drug Allergies  Past History:  Past Medical History: Allergic rhinitis Diabetes mellitus, type II    Hyperlipidemia   Colonic polyps, hx of     Left epididymitis/orchitis with left hydrocele 01/2008      Family History: Mother deceased 32's - seizure Father deceased ? medical hx Brother - stomach cancer Colon cancer - no            Social History: Occupation:  Working for [employer] Married -  since 1999 no children  Never Smoked     Alcohol use-no   Drug use-no            Physical Exam  General:  alert, well-developed, and well-nourished.   Lungs:  normal respiratory effort and normal breath sounds.   Heart:  normal rate, regular rhythm, and no gallop.   Extremities:  trace left pedal edema and trace right pedal edema.   Neurologic:  cranial nerves II-XII intact and gait normal.   Psych:  normally interactive, good eye contact, not anxious appearing, and not depressed appearing.     Impression & Recommendations:  Problem # 1:  DIABETES MELLITUS, TYPE II (ICD-250.00) Assessment Improved dramatic improvement.  combination of physical activity and med compliance.  DC amaryl due to risk of hypoglycemia.  he is taking 1/2 of actos dose.    The following medications were removed from the medication list:    Amaryl 1 Mg Tabs (Glimepiride) .Marland Kitchen... Take 1 tablet by mouth once a day His updated medication list for this problem includes:    Lisinopril-hydrochlorothiazide 20-25 Mg Tabs (Lisinopril-hydrochlorothiazide) ..... One by mouth once daily    Metformin Hcl 500 Mg Xr24h-tab (Metformin hcl) .Marland Kitchen... 2 by mouth two times a day    Aspirin Low Dose  81 Mg Tabs (Aspirin) .Marland Kitchen... Take 1 tablet by mouth once a day    Actos 45 Mg Tabs (Pioglitazone hcl) ..... One by mouth once daily  Labs Reviewed: Creat: 0.99 (10/31/2009)    Reviewed HgBA1c results: 5.9 (10/31/2009)  8.4 (06/06/2009)  Problem # 2:  HYPERTENSION (ICD-401.9) stable.  Maintain current medication regimen.  His updated medication list for this problem includes:    Lisinopril-hydrochlorothiazide 20-25 Mg Tabs (Lisinopril-hydrochlorothiazide) ..... One by mouth once daily    Labetalol Hcl 200 Mg Tabs (Labetalol hcl) .Marland Kitchen... Take one tablet by mouth twice daily    Dilt-cd 240 Mg Xr24h-cap (Diltiazem hcl coated beads) ..... One by mouth qd    Catapres 0.1 Mg Tabs (Clonidine hcl) ..... One tab by mouth two times a day  BP today: 130/80 Prior BP: 114/78  (07/11/2009)  Labs Reviewed: K+: 3.6 (10/31/2009) Creat: : 0.99 (10/31/2009)   Chol: 145 (09/26/2008)   HDL: 44.00 (09/26/2008)   LDL: 83 (09/26/2008)   TG: 91.0 (09/26/2008)  Complete Medication List: 1)  Lisinopril-hydrochlorothiazide 20-25 Mg Tabs (Lisinopril-hydrochlorothiazide) .... One by mouth once daily 2)  Metformin Hcl 500 Mg Xr24h-tab (Metformin hcl) .... 2 by mouth two times a day 3)  Labetalol Hcl 200 Mg Tabs (Labetalol hcl) .... Take one tablet by mouth twice daily 4)  Zocor 40 Mg Tabs (Simvastatin) .... Take one half  tablet daily 5)  Klor-con M20 20 Meq Cr-tabs (Potassium chloride crys cr) .... One by mouth once daily 6)  Dilt-cd 240 Mg Xr24h-cap (Diltiazem hcl coated beads) .... One by mouth qd 7)  Catapres 0.1 Mg Tabs (Clonidine hcl) .... One tab by mouth two times a day 8)  Omeprazole 20 Mg Tbec (Omeprazole) .... One by mouth once daily ac am meal 9)  Aspirin Low Dose 81 Mg Tabs (Aspirin) .... Take 1 tablet by mouth once a day 10)  Zyrtec Allergy 10 Mg Tabs (Cetirizine hcl) .... One by mouth qd 11)  Nasonex 50 Mcg/act Susp (Mometasone furoate) .... 2 sprays each nostril by mouth qd 12)  Proair Hfa 108 (90 Base) Mcg/act Aers (Albuterol sulfate) .... 2 puffs q 4 - 6 hrs as needed. 13)  Accu-chek Aviva Strp (Glucose blood) .... Use three times a day as directed 14)  Accu-chek Multiclix Lancets Misc (Lancets) .... Use three times a day as directed to check blood sugar 15)  Actos 45 Mg Tabs (Pioglitazone hcl) .... One by mouth once daily  Patient Instructions: 1)  Please schedule a follow-up appointment in 6 months. 2)  BMP prior to visit, ICD-9:  401.9 3)  HbgA1C prior to visit, ICD-9:  250.00 4)  Please return for lab work one (1) week before your next appointment.  Prescriptions: ACTOS 45 MG TABS (PIOGLITAZONE HCL) one by mouth once daily  #90 x 3   Entered and Authorized by:   D. Thomos Lemons DO   Signed by:   D. Thomos Lemons DO on 11/07/2009   Method used:    Electronically to        [facility] # [id number]* (retail)       [address] [place], [place]  [address]       Ph: [phone]       Fax: [phone]   RxID:   [id number] CATAPRES 0.1 MG  TABS (CLONIDINE HCL) one tab by mouth two times a day  #180 x 3  Entered and Authorized by:   D. Thomos Lemons DO   Signed by:   D. Thomos Lemons DO on 11/07/2009   Method used:   Electronically to        [facility] # [id number]* (retail)       [address] [place], [place]  [address]       Ph: [phone]       Fax: [phone]   RxID:   [id number] KLOR-CON M20 20 MEQ  CR-TABS (POTASSIUM CHLORIDE CRYS CR) one by mouth once daily  #90 x 3   Entered and Authorized by:   D. Thomos Lemons DO   Signed by:   D. Thomos Lemons DO on 11/07/2009   Method used:   Electronically to        [facility] # [id number]* (retail)       [address] [place], [place]  [address]       Ph: [phone]       Fax: [phone]   RxID:   [id number] ZOCOR 40 MG TABS (SIMVASTATIN) Take one half  tablet daily  #90 x 3   Entered and Authorized by:   D. Thomos Lemons DO   Signed by:   D. Thomos Lemons DO on 11/07/2009   Method used:   Electronically to        [facility] # [phone] [phone]* (retail)       [address] [place], [place]  [address]       Ph: [phone]       Fax: [phone]   RxID:   [id number] LABETALOL HCL 200 MG TABS (LABETALOL HCL) Take one tablet by mouth twice daily  #180 x 3   Entered and Authorized by:   D. Thomos Lemons DO   Signed by:   D. Thomos Lemons DO on 11/07/2009   Method used:   Electronically to        [facility] # [id number]* (retail)       [address] [place], [place]  [address]       Ph: [phone]       Fax: [phone]   RxID:   [id number] LISINOPRIL-HYDROCHLOROTHIAZIDE 20-25 MG TABS (LISINOPRIL-HYDROCHLOROTHIAZIDE) one by  mouth once daily  #90 x 3   Entered and Authorized by:   D. Thomos Lemons DO   Signed by:   D. Thomos Lemons DO on 11/07/2009   Method used:   Electronically to        [facility] # [id number]* (retail)       [address] [place], [place]  [address]       Ph: [phone]       Fax: [phone]   RxID:   [id number]   Current Allergies (reviewed today): No known allergies    Preventive Care Screening  Colonoscopy:    Date:  10/15/2004    Next Due:  10/2014    Results:  normal

## 2010-05-28 NOTE — Progress Notes (Signed)
Summary: Actos Samples  Phone Note Call from Patient Call back at Work Phone [phone]   Caller: Patient Call For: D. Thomos Lemons DO Summary of Call: patient called and left voice message requesting samples of Actos and a discount card if available.   Call was returned to patient, he was informed there are no Actos samples or discount cards available at this time. He was asked if he would like to try something  else, and he stated that was done in the past, and he would prefer to stay on the Actos. He states that his wife checked with her insurance company and once the deductible has been met, then his insurance would cover the medication with a lower copay. Initial call taken by: Glendell Docker CMA,  October 20, 2009 3:42 PM

## 2010-05-28 NOTE — Letter (Signed)
Summary: [facility]  [facility]   Imported By: Lanelle Bal 03/02/2010 14:19:04  _____________________________________________________________________  External Attachment:    Type:   Image     Comment:   External Document

## 2010-05-28 NOTE — Medication Information (Signed)
Summary: Noncompliance with Clonidine & Lisinopril/[employer]  Noncompliance with Clonidine & Lisinopril/[employer]   Imported By: Lanelle Bal 03/02/2010 14:17:31  _____________________________________________________________________  External Attachment:    Type:   Image     Comment:   External Document

## 2010-05-28 NOTE — Assessment & Plan Note (Signed)
Summary: 60mo. f/u - jr   Vital Signs:  Patient profile:   60 year old male Height:      64 inches Weight:      191.25 pounds BMI:     32.95 O2 Sat:      99 % on Room air Temp:     98.0 degrees F oral Pulse rate:   67 / minute Pulse rhythm:   regular Resp:     18 per minute BP sitting:   114 / 78  (right arm) Cuff size:   large  Vitals Entered By: Glendell Docker CMA (July 11, 2009 3:45 PM)  O2 Flow:  Room air CC: Rm 2- Follow up diabetes, Type 2 diabetes mellitus follow-up   Primary Care Provider:  DThomos Lemons DO  CC:  Rm 2- Follow up diabetes and Type 2 diabetes mellitus follow-up.  History of Present Illness:  Type 2 Diabetes Mellitus Follow-Up      This is a 60 year old man who presents for Type 2 diabetes mellitus follow-up.  The patient denies self managed hypoglycemia and hypoglycemia requiring help.  The patient denies the following symptoms: chest pain.  Since the last visit the patient reports good dietary compliance, compliance with medications, and monitoring blood glucose.    low blood sugar 77 high 151 avg 120 in the mornings and the evening 80's- 90's,  Januvia copay is too high  Htn - stable  Allergies (verified): No Known Drug Allergies  Past History:  Past Medical History: Allergic rhinitis Diabetes mellitus, type II    Hyperlipidemia  Colonic polyps, hx of     Left epididymitis/orchitis with left hydrocele 01/2008      Family History: Mother deceased 24's - seizure Father deceased ? medical hx Brother - stomach cancer Colon cancer - no           Social History: Occupation:  Radiation protection practitioner - unemployed Married -  since 1999 no children  Never Smoked   Alcohol use-no   Drug use-no          Physical Exam  General:  alert, well-developed, and well-nourished.   Neck:  supple, no masses, and no neck tenderness.   Lungs:  normal respiratory effort and normal breath sounds.   Heart:  normal rate, regular rhythm, no  murmur, and no gallop.    Diabetes Management Exam:    Foot Exam (with socks and/or shoes not present):       Inspection:          Left foot: normal          Right foot: normal   Impression & Recommendations:  Problem # 1:  DIABETES MELLITUS, TYPE II (ICD-250.00) Assessment Improved A1c improved.  We discussed potential need for insulin in the future.  both actos and [place] expensive.   use actos mail order. pt encouraged to exercise.  New job with [employer] helping with overall physical activity.  The following medications were removed from the medication list:    Januvia 100 Mg Tabs (Sitagliptin phosphate) ..... One by mouth once daily His updated medication list for this problem includes:    Lisinopril-hydrochlorothiazide 20-25 Mg Tabs (Lisinopril-hydrochlorothiazide) ..... One by mouth once daily    Metformin Hcl 500 Mg Xr24h-tab (Metformin hcl) .Marland Kitchen... 2 by mouth two times a day    Amaryl 1 Mg Tabs (Glimepiride) .Marland Kitchen... Take 1 tablet by mouth once a day    Aspirin Low Dose 81 Mg Tabs (Aspirin) .Marland Kitchen... Take 1  tablet by mouth once a day    Actos 45 Mg Tabs (Pioglitazone hcl) ..... One by mouth once daily  Labs Reviewed: Creat: 0.89 (06/06/2009)    Reviewed HgBA1c results: 8.4 (06/06/2009)  13.3 (03/25/2009)  Problem # 2:  HYPERTENSION (ICD-401.9) well controlled.  Maintain current medication regimen.  His updated medication list for this problem includes:    Lisinopril-hydrochlorothiazide 20-25 Mg Tabs (Lisinopril-hydrochlorothiazide) ..... One by mouth once daily    Labetalol Hcl 200 Mg Tabs (Labetalol hcl) .Marland Kitchen... Take one tablet by mouth twice daily    Dilt-cd 240 Mg Xr24h-cap (Diltiazem hcl coated beads) ..... One by mouth qd    Catapres 0.1 Mg Tabs (Clonidine hcl) ..... One tab by mouth two times a day  BP today: 114/78 Prior BP: 130/80 (05/09/2009)  Labs Reviewed: K+: 3.6 (06/06/2009) Creat: : 0.89 (06/06/2009)   Chol: 145 (09/26/2008)   HDL: 44.00 (09/26/2008)   LDL: 83  (09/26/2008)   TG: 91.0 (09/26/2008)  Complete Medication List: 1)  Lisinopril-hydrochlorothiazide 20-25 Mg Tabs (Lisinopril-hydrochlorothiazide) .... One by mouth once daily 2)  Metformin Hcl 500 Mg Xr24h-tab (Metformin hcl) .... 2 by mouth two times a day 3)  Labetalol Hcl 200 Mg Tabs (Labetalol hcl) .... Take one tablet by mouth twice daily 4)  Zocor 40 Mg Tabs (Simvastatin) .... Take one tablet daily 5)  Klor-con M20 20 Meq Cr-tabs (Potassium chloride crys cr) .... One by mouth once daily 6)  Dilt-cd 240 Mg Xr24h-cap (Diltiazem hcl coated beads) .... One by mouth qd 7)  Catapres 0.1 Mg Tabs (Clonidine hcl) .... One tab by mouth two times a day 8)  Amaryl 1 Mg Tabs (Glimepiride) .... Take 1 tablet by mouth once a day 9)  Omeprazole 20 Mg Tbec (Omeprazole) .... One by mouth once daily ac am meal 10)  Aspirin Low Dose 81 Mg Tabs (Aspirin) .... Take 1 tablet by mouth once a day 11)  Zyrtec Allergy 10 Mg Tabs (Cetirizine hcl) .... One by mouth qd 12)  Nasonex 50 Mcg/act Susp (Mometasone furoate) .... 2 sprays each nostril by mouth qd 13)  Proair Hfa 108 (90 Base) Mcg/act Aers (Albuterol sulfate) .... 2 puffs q 4 - 6 hrs as needed. 14)  Accu-chek Aviva Strp (Glucose blood) .... Use three times a day as directed 15)  Accu-chek Multiclix Lancets Misc (Lancets) .... Use three times a day as directed 16)  Actos 45 Mg Tabs (Pioglitazone hcl) .... One by mouth once daily   Patient Instructions: 1)  Please schedule a follow-up appointment in 4 months. 2)  BMP prior to visit, ICD-9:  401.9 3)  HbgA1C prior to visit, ICD-9:  250.02 4)  Try to excercise daily 20-30 minutes. 5)  Please return for lab work one (1) week before your next appointment.  6)  Please follow up with your eye doctor for DM eye exam Prescriptions: ACTOS 45 MG TABS (PIOGLITAZONE HCL) one by mouth once daily  #90 x 3   Entered and Authorized by:   D. Thomos Lemons DO   Signed by:   D. Thomos Lemons DO on 07/11/2009   Method used:    Print then Give to Patient   RxID:   [id number]   Current Allergies (reviewed today): No known allergies

## 2010-05-28 NOTE — Progress Notes (Signed)
Summary: [facility] refill  Phone Note Refill Request Message from:  Fax from Pharmacy on August 22, 2009 4:22 PM  Refills Requested: Medication #1:  KLOR-CON M20 20 MEQ  CR-TABS one by mouth once daily Initial call taken by: Lucious Groves,  August 22, 2009 4:22 PM    Prescriptions: KLOR-CON M20 20 MEQ  CR-TABS (POTASSIUM CHLORIDE CRYS CR) one by mouth once daily  #30 x 3   Entered by:   Lucious Groves   Authorized by:   D. Thomos Lemons DO   Signed by:   Lucious Groves on 08/22/2009   Method used:   Electronically to        [facility] (retail)       [address]       [place], [place]  [address]       Ph: [phone]       Fax: [phone]   RxID:   [id number]

## 2010-05-28 NOTE — Progress Notes (Signed)
Summary: refill --Simvastatin  Phone Note Refill Request Message from:  Pharmacy on January 13, 2010 2:15 PM  Refills Requested: Medication #1:  simvastatin 40 mg tab   Dosage confirmed as above?Dosage Confirmed   Brand Name Necessary? No   Supply Requested: 1 month   Last Ref[facility]: [facility] [address] [place] [address] phone [phone] fax [phone]   Method Requested: Electronic Next Appointment Scheduled: 05-08-2010 [address]  Initial call taken by: Roselle Locus,  January 13, 2010 2:16 PM    Prescriptions: ZOCOR 40 MG TABS (SIMVASTATIN) Take one half  tablet daily  #45 x 1   Entered by:   Mervin Kung CMA (AAMA)   Authorized by:   D. Thomos Lemons DO   Signed by:   Mervin Kung CMA (AAMA) on 01/13/2010   Method used:   Electronically to        [facility] (retail)       [address]       [place], [place]  [address]       Ph: [phone]       Fax: [phone]   RxID:   [id number]

## 2010-05-28 NOTE — Progress Notes (Addendum)
Summary: Prior Keith Hardin  Phone Note Outgoing Call   Call placed by: Chestine Spore Call placed to: Insurer Details for Reason: Prior Auth   Tradjenta  Summary of Call: Prior Auth for  Tradjenta 5mg   form requested /[employer] Initial call taken by: Darral Dash,  May 11, 2010 8:53 AM  Follow-up for Phone Call        Form received and forwarded to Provider for signature. Nicki Guadalajara Fergerson CMA Duncan Dull)  May 11, 2010 3:54 PM   Additional Follow-up for Phone Call Additional follow up Details #1::        form faxed to [employer] at [phone], awaiting approval status Additional Follow-up by: Glendell Docker CMA,  May 13, 2010 4:36 PM     Appended Document: Prior Auth Trajenta Received notice from [place] that pt must have had failure or intolerance to 1 formulary alternative such as: Januvia, Janumet, Kombiglyze XR or Onglyza.  Please advise.

## 2010-05-28 NOTE — Progress Notes (Signed)
Summary: Actos samples  Phone Note Call from Patient   Caller: Patient Call For: D. Thomos Lemons DO Summary of Call: Pt called requesting samples of Actos. Left message on machine for pt to return my call. Pt currently takes 45mg . We have 30mg  and 15mg , pt will need to take one of each strength daily. Samples have been left at the front desk. Nicki Guadalajara Fergerson CMA Duncan Dull)  February 19, 2010 2:51 PM   Follow-up for Phone Call        Spoke to pt, he has already picked up samples and is aware to take one of each strength daily.   Nicki Guadalajara Fergerson CMA Duncan Dull)  February 20, 2010 12:29 PM     Prescriptions: ACTOS 45 MG TABS (PIOGLITAZONE HCL) one by mouth once daily  #0 x 0   Entered by:   Mervin Kung CMA (AAMA)   Authorized by:   D. Thomos Lemons DO   Signed by:   Mervin Kung CMA (AAMA) on 02/19/2010   Method used:   Samples Given   RxID:   [id number]  Actos 15mg  [id number] Exp 03/2012 x 4 boxes Actos 30mg  [id number] Exp 03/2012 x 4 boxes   Mervin Kung CMA Duncan Dull)  February 19, 2010 2:54 PM

## 2010-05-28 NOTE — Progress Notes (Signed)
Summary: wants to pick up januvia samples while here for labs  Phone Note Call from Patient   Caller: Patient Call For: D. Thomos Lemons DO Summary of Call: Pt. is coming today for Labs and wanted to know if he can pick up some Januvia samples while he is here for labs today Initial call taken by: Michaelle Copas,  June 06, 2009 11:28 AM  Follow-up for Phone Call        ok for samples if avail Follow-up by: D. Thomos Lemons DO,  June 06, 2009 3:09 PM  Additional Follow-up for Phone Call Additional follow up Details #1::        Handed pt. the Januvia samples. Thank you.Michaelle Copas  June 06, 2009 3:50 PM

## 2010-05-28 NOTE — Progress Notes (Signed)
Summary: needs 30mg  Actos samples  Phone Note Call from Patient   Caller: Patient Call For: D. Thomos Lemons DO Reason for Call: Talk to Nurse Summary of Call: pt. would like to know if he can come by and pick up Actos samples 30mg .... call pt. back and let him know, [phone] Initial call taken by: Michaelle Copas,  May 02, 2009 10:19 AM  Follow-up for Phone Call        ok to give pt samples if avail Follow-up by: D. Thomos Lemons DO,  May 02, 2009 12:57 PM  Additional Follow-up for Phone Call Additional follow up Details #1::        patient advised per Dr Artist Pais, samples provided. Left at front desk for patient pick up Additional Follow-up by: Glendell Docker CMA,  May 02, 2009 2:08 PM

## 2010-05-28 NOTE — Assessment & Plan Note (Signed)
Summary: 1 mon f/u/hea   Vital Signs:  Patient profile:   60 year old male Weight:      197.25 pounds BMI:     33.98 O2 Sat:      99 % on Room air Temp:     97.8 degrees F oral Pulse rate:   58 / minute Pulse rhythm:   regular Resp:     18 per minute BP sitting:   130 / 80  (right arm) Cuff size:   large  Vitals Entered By: Glendell Docker CMA (May 09, 2009 4:11 PM)  O2 Flow:  Room air  Primary Care Provider:  D. Thomos Lemons DO  CC:  1 Month Follow  and Type 2 diabetes mellitus follow-up.  History of Present Illness: 1 Month Follow up Diabetes management  Type 2 Diabetes Mellitus Follow-Up      This is a 60 year old man who presents for Type 2 diabetes mellitus follow-up.  The patient reports self managed hypoglycemia, but denies hypoglycemia requiring help.  The patient denies the following symptoms: chest pain.  Since the last visit the patient reports good dietary compliance, compliance with medications, exercising regularly, and monitoring blood glucose.  low blood sugar 65 high  161 avg 140.  low blood sugar assoc with more than usual execise.   Allergies (verified): No Known Drug Allergies  Past History:  Past Medical History: Allergic rhinitis Diabetes mellitus, type II    Hyperlipidemia  Colonic polyps, hx of    Left epididymitis/orchitis with left hydrocele 01/2008      Family History: Mother deceased 28's - seizure Father deceased ? 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   Neck:  supple, no masses, and no neck tenderness.   Lungs:  normal respiratory effort and normal breath sounds.   Heart:  normal rate, regular rhythm, and no gallop.     Impression & Recommendations:  Problem # 1:   DIABETES MELLITUS, TYPE II (ICD-250.00) AM blood sugars much better.  Actos cost prohibitive.  switch to actos.  much better dietary compliance.  he is also exercising.  A1c before next OV.  Pt advised to not take amaryl on days he exercises to avoid hypoglycemia The following medications were removed from the medication list:    Actos 30 Mg Tabs (Pioglitazone hcl) ..... One by mouth once daily His updated medication list for this problem includes:    Lisinopril-hydrochlorothiazide 20-25 Mg Tabs (Lisinopril-hydrochlorothiazide) ..... One by mouth once daily    Metformin Hcl 500 Mg Xr24h-tab (Metformin hcl) .Marland Kitchen... 2 by mouth two times a day    Amaryl 1 Mg Tabs (Glimepiride) .Marland Kitchen... Take 1 tablet by mouth once a day    Aspirin Low Dose 81 Mg Tabs (Aspirin) .Marland Kitchen... Take 1 tablet by mouth once a day    Januvia 100 Mg Tabs (Sitagliptin phosphate) ..... One by mouth once daily  Labs Reviewed: Creat: 0.97 (03/25/2009)     Complete Medication List: 1)  Lisinopril-hydrochlorothiazide 20-25 Mg Tabs (Lisinopril-hydrochlorothiazide) .... One by mouth once daily 2)  Metformin Hcl 500 Mg Xr24h-tab (Metformin hcl) .... 2 by mouth two times a day 3)  Labetalol Hcl 200 Mg Tabs (  Labetalol hcl) .... Take one tablet by mouth twice daily 4)  Zocor 40 Mg Tabs (Simvastatin) .... Take one tablet daily 5)  Klor-con M20 20 Meq Cr-tabs (Potassium chloride crys cr) .... One by mouth once daily 6)  Dilt-cd 240 Mg Xr24h-cap (Diltiazem hcl coated beads) .... One by mouth qd 7)  Catapres 0.1 Mg Tabs (Clonidine hcl) .... One tab by mouth two times a day 8)  Amaryl 1 Mg Tabs (Glimepiride) .... Take 1 tablet by mouth once a day 9)  Omeprazole 20 Mg Tbec (Omeprazole) .... One by mouth once daily ac am meal 10)  Aspirin Low Dose 81 Mg Tabs (Aspirin) .... Take 1 tablet by mouth once a day 11)  Zyrtec Allergy 10 Mg Tabs (Cetirizine hcl) .... One by mouth qd 12)  Nasonex 50 Mcg/act Susp (Mometasone furoate) .... 2 sprays each nostril  by mouth qd 13)  Proair Hfa 108 (90 Base) Mcg/act Aers (Albuterol sulfate) .... 2 puffs q 4 - 6 hrs as needed. 14)  Accu-chek Aviva Strp (Glucose blood) .... Use three times a day as directed 15)  Accu-chek Multiclix Lancets Misc (Lancets) .... Use three times a day as directed 16)  Januvia 100 Mg Tabs (Sitagliptin phosphate) .... One by mouth once daily 17)  Januvia 100 Mg Tabs (Sitagliptin phosphate) .... One by mouth once daily  Patient Instructions: 1)  Please schedule a follow-up appointment in 2 months. 2)  BMP prior to visit, ICD-9: 250.02 3)  HbgA1C prior to visit, ICD-9: 250.02 4)  Urine Microalbumin prior to visit, ICD-9: 250.02 5)  Please return for lab work one (1) week before your next appointment.  Prescriptions: JANUVIA 100 MG TABS (SITAGLIPTIN PHOSPHATE) one by mouth once daily  #90 x 3   Entered and Authorized by:   D. Thomos Lemons DO   Signed by:   D. Thomos Lemons DO on 05/09/2009   Method used:   Print then Give to Patient   RxID:   [id number] JANUVIA 100 MG TABS (SITAGLIPTIN PHOSPHATE) one by mouth once daily  #30 x 3   Entered and Authorized by:   D. Thomos Lemons DO   Signed by:   D. Thomos Lemons DO on 05/09/2009   Method used:   Electronically to        [facility] # [phone]* (retail)       [address] [place], [place]  [address]       Ph: [phone]       Fax: [phone]   RxID:   [id number]   Current Allergies (reviewed today): No known allergies

## 2010-05-28 NOTE — Progress Notes (Signed)
Summary: Onglyza rx.  Phone Note Outgoing Call   Summary of Call: call pt - change tradjenta to onglyza.  see change in rx Initial call taken by: D. Thomos Lemons DO,  May 22, 2010 1:04 PM  Follow-up for Phone Call        Pt notified. Nicki Guadalajara Fergerson CMA Duncan Dull)  May 22, 2010 1:31 PM     New/Updated Medications: ONGLYZA 5 MG TABS (SAXAGLIPTIN HCL) one by mouth once daily Prescriptions: ONGLYZA 5 MG TABS (SAXAGLIPTIN HCL) one by mouth once daily  #30 x 3   Entered and Authorized by:   D. Thomos Lemons DO   Signed by:   D. Thomos Lemons DO on 05/22/2010   Method used:   Electronically to        [facility] # [phone]* (retail)       [address] [place], [place]  [address]       Ph: [phone]       Fax: [phone]   RxID:   [id number]

## 2010-05-28 NOTE — Progress Notes (Signed)
Summary: refill--simvastatin  Phone Note Refill Request Message from:  Fax from [facility] [address] on October 23, 2009 6:57 AM  Refills Requested: Medication #1:  ZOCOR 40 MG TABS Take one tablet daily   Dosage confirmed as above?Dosage Confirmed   Supply Requested: 1 month Next Appointment Scheduled: 11/07/09--Dr Artist Pais Initial call taken by: Mervin Kung CMA,  October 23, 2009 11:17 AM    Prescriptions: ZOCOR 40 MG TABS (SIMVASTATIN) Take one tablet daily  #90 x 1   Entered by:   Mervin Kung CMA   Authorized by:   D. Thomos Lemons DO   Signed by:   Mervin Kung CMA on 10/23/2009   Method used:   Electronically to        [facility] [address] (retail)       [address]       [place], [place]  [address]       Ph: [phone]       Fax: [phone]   RxID:   [id number]

## 2010-06-03 NOTE — Progress Notes (Signed)
Summary: REFILL AND SAMPLES   Phone Note Refill Request Call back at Home Phone [phone] Message from:  Patient on May 25, 2010 11:15 AM  Refills Requested: Medication #1:  METFORMIN HCL 500 MG XR24H-TAB 2 by mouth two times a day   Brand Name Necessary? No   Supply Requested: 3 months  Medication #2:  KLOR-CON M20 20 MEQ  CR-TABS one by mouth once daily   Brand Name Necessary? No   Supply Requested: 3 months [facility] [address] [place]  ONGLYZA 5 MG IS OVER $200 DO YOU HAVE SAMPLES    Method Requested: Electronic Initial call taken by: Roselle Locus,  May 25, 2010 11:16 AM  Follow-up for Phone Call        Rx completed in Dr. Tiajuana Amass Follow-up by: Glendell Docker CMA,  May 25, 2010 11:51 AM  Additional Follow-up for Phone Call Additional follow up Details #1::        call placed to patient at [phone], he has been informed no samples for Onglyza were in, however a discount card was offered and patient accepted. Left at front desk for patient pick up  Additional Follow-up by: Glendell Docker CMA,  May 25, 2010 11:54 AM    Prescriptions: KLOR-CON M20 20 MEQ  CR-TABS (POTASSIUM CHLORIDE CRYS CR) one by mouth once daily  #90 x 1   Entered by:   Glendell Docker CMA   Authorized by:   D. Thomos Lemons DO   Signed by:   Glendell Docker CMA on 05/25/2010   Method used:   Electronically to        [facility] # [phone]* (retail)       [address] [place], [place]  [address]       Ph: [phone]       Fax: [phone]   RxID:   [id number] METFORMIN HCL 500 MG XR24H-TAB (METFORMIN HCL) 2 by mouth two times a day  #360 x 1   Entered by:   Glendell Docker CMA   Authorized by:   D. Thomos Lemons DO   Signed by:   Glendell Docker CMA on 05/25/2010   Method used:   Electronically to        [facility] # [phone]* (retail)       [address] [place], [place]  [address]       Ph: [phone]   Fax: [phone]   RxID:   [id number]

## 2010-06-03 NOTE — Medication Information (Signed)
Summary: Prior [facility] for Tradjenta/[employer]  Prior [facility] for Tradjenta/[employer]   Imported By: Sherian Rein 05/26/2010 09:23:28  _____________________________________________________________________  External Attachment:    Type:   Image     Comment:   External Document

## 2010-06-05 ENCOUNTER — Telehealth: Payer: Self-pay | Admitting: Internal Medicine

## 2010-06-17 NOTE — Progress Notes (Signed)
Summary: Trajenta Samples  Phone Note Call from Patient Call back at Work Phone [phone]   Caller: Patient Call For: D. Thomos Lemons DO Summary of Call: patient called and left voice message requesting samples of Trajenta. He states  that he has not been able to have the Onglyza filled due to cost of the medication. He would like to know if samples for Trajenta could be provided until is able to get his medication straightened out. Initial call taken by: Glendell Docker CMA,  June 05, 2010 11:25 AM  Follow-up for Phone Call        if onglyza is also too expensive, I suggest pt change to [place]. he can pick up samples of januvia 100 mg once daily Follow-up by: D. Thomos Lemons DO,  June 05, 2010 5:04 PM  Additional Follow-up for Phone Call Additional follow up Details #1::        call placed to patient at [phone], he has been informed per Dr Artist Pais instructions. Patient was advised samples of Januvia left at front desk for patient pick up Additional Follow-up by: Glendell Docker CMA,  June 08, 2010 9:56 AM    New/Updated Medications: JANUVIA 100 MG TABS (SITAGLIPTIN PHOSPHATE) one by mouth once daily Prescriptions: JANUVIA 100 MG TABS (SITAGLIPTIN PHOSPHATE) one by mouth once daily  #30 x 3   Entered and Authorized by:   D. Thomos Lemons DO   Signed by:   D. Thomos Lemons DO on 06/05/2010   Method used:   Electronically to        [facility] # [phone]* (retail)       [address] [place], [place]  [address]       Ph: [phone]       Fax: [phone]   RxID:   [id number]

## 2010-06-19 ENCOUNTER — Telehealth: Payer: Self-pay | Admitting: Internal Medicine

## 2010-07-01 ENCOUNTER — Telehealth: Payer: Self-pay | Admitting: Internal Medicine

## 2010-07-02 NOTE — Progress Notes (Signed)
Summary: refill-klor con  Phone Note Refill Request Message from:  Fax from Pharmacy on June 19, 2010 9:18 AM  Refills Requested: Medication #1:  KLOR-CON M20 20 MEQ  CR-TABS one by mouth once daily   Dosage confirmed as above?Dosage Confirmed   Brand Name Necessary? No   Supply Requested: 3 months   Last Refilled: [facility] [address] [place] [address] fax [phone]   Method Requested: Electronic Next Appointment Scheduled: none Initial call taken by: Elba Barman,  June 19, 2010 9:19 AM  Follow-up for Phone Call        Left message on machine to return my call. Rx was filled on 05/25/10 #90 x 1 refill to [facility] not [facility].  Pt should not need refill yet.  Nicki Guadalajara Fergerson CMA Duncan Dull)  June 19, 2010 9:56 AM   Additional Follow-up for Phone Call Additional follow up Details #1::        Left detailed message on home phone to call re: status of refill request. Mervin Kung CMA Duncan Dull)  June 19, 2010 3:12 PM     Additional Follow-up for Phone Call Additional follow up Details #2::    Left detailed message on home machine to call and let us know if he picked up Rx from [facility] on 05/25/10 or can we discard [facility] request. Mervin Kung CMA Duncan Dull)  June 22, 2010 1:57 PM  pt returned phone call. [phone]. please assist.  Elba Barman,  June 23, 2010 12:58 PM  Attempted to contact pt at above number and left message on voicemail. Nicki Guadalajara Fergerson CMA Duncan Dull)  June 23, 2010 3:25 PM   pt called and said that you can reach him tomorrow at home [phone].  Follow-up by: Elba Barman,  June 24, 2010 2:24 PM  Additional Follow-up for Phone Call Additional follow up Details #3:: Details for Additional Follow-up Action Taken: Spoke to pt, he verified that he had not picked up Potassium rx from [facility] yet but will go get it. Request from [facility] has been discarded and message was left on pharmacy voicemail. Pt states  he uses both pharmacies. Nicki Guadalajara Fergerson CMA Duncan Dull)  June 25, 2010 8:57 AM

## 2010-07-07 NOTE — Progress Notes (Signed)
Summary: Januvia samples  Phone Note Call from Patient Call back at [facility] Phone [phone]   Caller: Patient Call For: D. Thomos Lemons DO Summary of Call: pt requesting samples of junuvia. please assist. Initial call taken by: Elba Barman,  July 01, 2010 12:48 PM  Follow-up for Phone Call        call placed to patient at [phone], no answer. A detailed voice message was left informing patient samples of Januvia left at front desk for patient pick up Follow-up by: Glendell Docker CMA,  July 01, 2010 2:49 PM    New/Updated Medications: JANUVIA 100 MG TABS (SITAGLIPTIN PHOSPHATE) one by mouth once daily Prescriptions: JANUVIA 100 MG TABS (SITAGLIPTIN PHOSPHATE) one by mouth once daily  #30 x 0   Entered by:   Glendell Docker CMA   Authorized by:   D. Thomos Lemons DO   Signed by:   Glendell Docker CMA on 07/01/2010   Method used:   Samples Given   RxID:   [id number]

## 2010-07-28 ENCOUNTER — Other Ambulatory Visit: Payer: Self-pay | Admitting: Internal Medicine

## 2010-07-28 DIAGNOSIS — E785 Hyperlipidemia, unspecified: Secondary | ICD-10-CM

## 2010-07-29 NOTE — Telephone Encounter (Signed)
Medication refill sent to pharmacy  

## 2010-08-26 ENCOUNTER — Telehealth: Payer: Self-pay | Admitting: *Deleted

## 2010-08-26 ENCOUNTER — Telehealth: Payer: Self-pay | Admitting: Internal Medicine

## 2010-08-26 DIAGNOSIS — E119 Type 2 diabetes mellitus without complications: Secondary | ICD-10-CM

## 2010-08-26 MED ORDER — METFORMIN HCL ER 500 MG PO TB24: ORAL_TABLET | ORAL | Status: DC

## 2010-08-26 NOTE — Telephone Encounter (Signed)
Sig:  2 tabs by mouth twice daily

## 2010-08-26 NOTE — Telephone Encounter (Signed)
[facility] [address] [place] [facility] refill on glucophage xr 500 mg tab qty 360 take 2 by mouth twice daily last fill 08-18-10

## 2010-08-26 NOTE — Telephone Encounter (Signed)
Call placed to patient [phone], he has verified the [facility]. He states that he did not pick up the Rx refill at [facility] Rx refill sent

## 2010-08-26 NOTE — Telephone Encounter (Signed)
Received call from [facility] at [facility] requesting clarification on Metformin directions. Rx states Metformin XR 500mg  Take 2 tablets once 2 times a day. Please confirm directions.

## 2010-08-27 ENCOUNTER — Telehealth: Payer: Self-pay | Admitting: Internal Medicine

## 2010-08-27 MED ORDER — METFORMIN HCL ER 500 MG PO TB24: ORAL_TABLET | ORAL | Status: DC

## 2010-08-27 NOTE — Telephone Encounter (Signed)
Would like some samples of [place]

## 2010-08-27 NOTE — Telephone Encounter (Signed)
Rx refill re-sent with corrected instructions

## 2010-08-27 NOTE — Telephone Encounter (Signed)
Samples of Januvia left at front desk for patient pick up, he has been informed

## 2010-09-08 NOTE — Consult Note (Signed)
NAMEKYO, COCUZZA              ACCOUNT NO.:  [id number]   MEDICAL RECORD NO.:  [id number]           PATIENT TYPE:   LOCATION:                                 FACILITY:   PHYSICIAN:  Bertram Millard. Dahlstedt, M.D.DATE OF BIRTH:  [birth year]   DATE OF CONSULTATION:  02/20/2008  DATE OF DISCHARGE:                                 CONSULTATION   REASON FOR CONSULTATION:  Left scrotal swelling, fever, infection.   BRIEF HISTORY:  This 60 year old diabetic man began having left scrotal  pain and swelling as well as fever about 4 days ago.  He presented to  urgent medical and received Rocephin 250 mg IM and was put on  doxycycline.  Since that time, he has had increased pain and swelling  with a fever to 101.5.  He presented to the emergency room.  He was  evaluated and found to have a moderate-sized left hydrocele, normal  testicles, and normal flow to the testicles.  There was no sign of  cutaneous abscess.  Urologic consultation is requested.   His medical history is significant for diabetes, hypertension,  hypokalemia, hyperlipidemia, and a prior urologic procedure, possibly an  internal urethrotomy.  He has had Achilles tendon repair on the right.   Medications on admission included:  1. Aspirin 81 mg daily.  2. Prilosec 40 mg a day.  3. Amaryl 1 mg a day.  4. Catapres 0.1 mg b.i.d.  5. Tiazac 240 mg daily.  6. Klor-Con 20 mEq daily.  7. Zocor 40 mg daily.  8. Labetalol 200 mg b.i.d.  9. Glucophage XR 500 mg b.i.d.  10.Benicar/hydrochlorothiazide 40/25 daily.  11.Actos 30 mg daily.   There are no known drug allergies.   The patient is married and has children.  He is currently employed.   PHYSICAL EXAMINATION:  GENERAL:  A pleasant male lying comfortably in  bed in no distress.  VITAL SIGNS:  Blood pressure 145/82, pulse 78, respiratory rate 20,  temperature 100.  HEENT:  Normal.  NECK:  Supple.  CHEST:  Normal breath sounds bilaterally.  HEART:  Normal rate and rhythm.   No rubs, gallops, or murmurs.  There  were no carotid bruits.  ABDOMEN:  Mildly protuberant, soft, nontender.  No distention.  Phallus  was uncircumcised.  He had a normal meatus without blood.  Glans was  normal, as was scrotal skin with the exception of mild edema.  There  were no subcutaneous abscesses.  Right testicle epididymis and cord was  normal.  There were no inguinal hernias.  There was a significant left  hydrocele.  There was significant tenderness and induration to the left  epididymis and the testicle.   Urine culture from February 19, 2008, was negative.  GC culture was  negative.  White count today was 10,400, hematocrit 37%.  Blood cultures  are negative.   IMPRESSION:  1. Left epididymal orchitis with significant response in regards to      pain and fever.  He seems to be getting better with decreased pain.      He is on dual antibiotics including piperacillin  and Cipro.  2. Left hydrocele, minimally symptomatic.  This may be reactive.   PLAN:  1. At this point, if he is afebrile and feeling fairly well, I would      feel comfortable letting him go home tomorrow on 10 days of Cipro      500 mg b.i.d.  2. I would think that a short course of anti-inflammatories would be      helpful as well - this may decrease inflammation and is very      effective for pain and epididymitis.  3. I have ordered a scrotal support - this will help the patient.  4. I would recommend that he follow up with Dr. Brunilda Payor in about 2 weeks.      Bertram Millard. Dahlstedt, M.D.  Electronically Signed     SMD/MEDQ  D:  02/20/2008  T:  02/21/2008  Job:  [id number]   cc:   Barbette Hair. Artist Pais, DO

## 2010-09-08 NOTE — Discharge Summary (Signed)
NAMESARGON, Keith Hardin              ACCOUNT NO.:  [id number]   MEDICAL RECORD NO.:  [id number]          PATIENT TYPE:  INP   LOCATION:  5118                         FACILITY:  [facility]   PHYSICIAN:  Valerie A. Felicity Coyer, MDDATE OF BIRTH:  09/06/1950   DATE OF ADMISSION:  02/18/2008  DATE OF DISCHARGE:  02/21/2008                               DISCHARGE SUMMARY   DISCHARGE DIAGNOSES:  1. Left epididymitis/orchitis with left hydrocele.  2. Diabetes, type 2.  3. Hyperlipidemia.  4. Hypertension.  5. History of hypokalemia, resolved status post repletion.   HISTORY OF PRESENT ILLNESS:  Keith Hardin is a 60 year old African  American male who was admitted on February 18, 2008, with chief complaint  of groin pain.  He noted that the pain started on Saturday morning,  February 16, 2008.  It was accompanied by increased swelling of the  scrotum.  He presented to primary care at 1:00 p.m. on Sunday and was  given ceftriaxone and doxycycline.  The pain and swelling persisted.  He  presented to the emergency department with a fever of 101.4.  He was  admitted for further evaluation and treatment.   COURSE OF HOSPITALIZATION:  1. Left epididymitis/hydrocele/orchitis:  The patient was admitted.      Urine was sent for culture, which was negative.  A GC culture was      sent, which is currently pending, as well as blood cultures which      remain no growth to date.  He did have some low-grade fever during      this admission.  He is currently afebrile.  He was seen in      consultation by Dr. Retta Diones who recommended elevation and support      of the scrotum.  He recommended jockstrap as well as continuation      of antibiotics with oral Cipro for an additional 10 days.  He will      follow up with Dr. Brunilda Payor in 2 weeks.   MEDICATIONS AT THE TIME OF DISCHARGE:  1. Actos 30 mg p.o. daily.  2. Benicar HCT 40/25 one tablet p.o. daily.  3. Glucophage XR 500 mg 2 tablets p.o. daily.  4. Labetalol HCl  200 mg p.o. b.i.d.  5. Zocor 40 mg p.o. daily.  6. Klor-Con 20 mEq p.o. daily.  7. Tiazac 240 mg p.o. daily.  8. Catapres 0.1 mg 1 tablet p.o. b.i.d.  9. Amaryl 1 mg p.o. daily.  10.Omeprazole 40 mg p.o. daily.  11.Aspirin 81 mg p.o. daily.  12.Cipro 500 mg p.o. b.i.d. x10 days.   PERTINENT LABORATORIES AT THE TIME OF DISCHARGE:  BUN 14 and creatinine  0.81.  Hemoglobin 12.6, hematocrit 37.4, white blood count 10.4, and  platelets 163.   DISPOSITION:  The patient will be discharged to home.   FOLLOWUP:  He is instructed to follow up with Dr. Brunilda Payor in 2 weeks and  contact the office for an appointment, as well as follow up with Dr. Artist Pais  on March 11, 2008, at 3:30 p.m.  He is instructed to call Dr. Artist Pais  should he develop fever  greater than 101 or increased swelling and pain  in the groin.   Greater than 30 minutes were spent on discharge planning.      Keith Craze, NP      Raenette Rover. Felicity Coyer, MD  Electronically Signed    MO/MEDQ  D:  02/21/2008  T:  02/21/2008  Job:  [id number]   cc:   Lindaann Slough, M.D.  Barbette Hair. Artist Pais, DO

## 2010-09-08 NOTE — H&P (Signed)
NAME:  Keith Hardin, Keith Hardin              ACCOUNT NO.:  [id number]   MEDICAL RECORD NO.:  [id number]          PATIENT TYPE:  INP   LOCATION:  5118                         FACILITY:  [facility]   PHYSICIAN:  Darryl D. Prime, MD    DATE OF BIRTH:  [birth year]   DATE OF ADMISSION:  02/18/2008  DATE OF DISCHARGE:                              HISTORY & PHYSICAL   Patient is a full code.   PRIMARY CARE PHYSICIAN:  Michelle Vanhise. Artist Pais, DO.   Patient's total visit time is approximately 52 minutes.   Patient is a good historian.  He is accompanied by his wife.   CHIEF COMPLAINT:  Groin pain.   HISTORY OF PRESENT ILLNESS:  Mr. Critzer is a 60 year old male with a  history of hypertension plus over 10 years on medications for this,  history of hyperlipidemia, who notes on Saturday a.m., (it is Monday  early a.m. around 3:30), he notes groin swelling bilaterally and  associated severe pain.  The pain and swelling has progressed.  Patient  notes he went to [facility] around 1 p.m. on Sunday and was given  ceftriaxone 250 IM and doxycycline p.o.  Notes the pain and swelling  persisted.  It persisted tonight with a fever to 101.4 in the emergency  room.  He notes associated fevers, sweats.  A mild odor to the urine.  The patient received Tylenol today for his fever.   In the emergency room, he had an ultrasound performed that showed left  epididymitis with no signs of an abscess and a large left hydrocele,  normal size to his testicles, and no signs of decreased blood flow.   ALLERGIES:  No known drug allergies.   MEDICATIONS:  1. Actos 30 mg daily.  2. Benicar/HCT 40/25 daily.  3. Glucophage XR 500 mg twice daily.  4. Labetalol 200 mg twice daily.  5. Zocor 40 mg daily.  6. Klor-Con 20 mEq daily.  7. Tiazac 240 mg XR daily.  8. Catapres 0.1 mg twice daily.  9. Amaryl 1 mg daily.  10.Omeprazole 40 mg daily.  11.Aspirin 81 mg daily.   PAST MEDICAL HISTORY:  1. Diabetes for greater than 10 years.  2.  Hyperlipidemia.  3. Hypertension.  4. Hypokalemia.  5. Status post TURP possibly six years ago.  6. History of torn Achilles tendon on the right, status post repair.   SOCIAL HISTORY:  No history of tobacco, alcohol, or drug use.  He is  married.   FAMILY HISTORY:  Positive for hypertension and stroke in his  grandparents.   REVIEW OF SYSTEMS:  A 14-point review of systems is negative except as  stated above.   Patient in the emergency room is given Zofran, morphine, and Tylenol.   PHYSICAL EXAMINATION:  VITAL SIGNS:  Temperature is 101.4 with a  respiratory rate of 14, pulse of 96, blood pressure 142/88, sats 94% on  room air.  GENERAL:  Patient is a male who looks younger than his stated age, lying  flat in bed in no acute distress.  HEENT:  Normocephalic and atraumatic.  Pupils are equal, round  and  reactive to light.  Extraocular movements are intact.  Oropharynx shows  no posterior pharyngeal lesions.  NECK:  Supple with no lymphadenopathy or thyromegaly.  No carotid  bruits.  LUNGS:  Clear to auscultation bilaterally.  CARDIOVASCULAR:  Regular rate and rhythm.  EXTREMITIES:  No clubbing, cyanosis or edema.  ABDOMEN:  Soft, nontender, nondistended.  There is no  hepatosplenomegaly.  Groin and scrotal area shows significant swelling in the soft tissues  and scrotum bilaterally.  He has mild tenderness, left side is greater  than the right.  The area around the left testicle is rock-solid.  NEUROLOGIC:  He is alert and oriented x4.  Cranial nerves II-XII are  grossly intact.  Patient notes that he had a prostate exam at [facility] that showed no  signs of prostatitis.  He had no tenderness on digital palpation.   Patient had a GC/Chlamydia swab at [facility] and also here.  Patient's  laboratory data shows a sodium of 135, potassium 3.6, chloride 101,  bicarb 24, BUN 14, creatinine 0.81, glucose 151.  White count of 14.7,  hemoglobin 13.4 with a hematocrit of 41.3,  platelets 180, segs 92%.  Urinalysis showed WBCs of 11-20 with rare bacteria, large leukocyte  esterase, nitrites positive, protein 30, ketones 15.   Ultrasound is as above of the groin.   ASSESSMENT/PLAN:  Patient with a history of diabetes, now epididymitis,  with fever and leukocytosis.  There are no signs of prostatitis.  Doubt  this is due to sexually transmitted disease but will follow up on the  swab cultures and Gram's stain.  We will give him ciprofloxacin and  Zosyn IV, given his longstanding diabetes.  Will get urine cultures and  blood cultures.  Will follow his fever curve for clinical improvement of  his epididymitis.      Darryl D. Prime, MD  Electronically Signed     DDP/MEDQ  D:  02/19/2008  T:  02/19/2008  Job:  [id number]

## 2010-09-24 ENCOUNTER — Telehealth: Payer: Self-pay | Admitting: *Deleted

## 2010-09-24 NOTE — Telephone Encounter (Signed)
Pt called requesting samples of Januvia. Advised pt I would call him back with response.

## 2010-09-25 MED ORDER — SITAGLIPTIN PHOSPHATE 100 MG PO TABS: 100.0000 mg | ORAL_TABLET | Freq: Every day | ORAL | Status: DC

## 2010-09-25 NOTE — Telephone Encounter (Signed)
Samples of Januvia 100mg  left at front desk for patient pick up. Call placed to patient at [phone], he was advised.

## 2010-10-21 ENCOUNTER — Other Ambulatory Visit: Payer: Self-pay | Admitting: Internal Medicine

## 2010-10-21 NOTE — Telephone Encounter (Signed)
Rx refill sent to pharmacy.Patient is due for office visit 

## 2010-10-22 ENCOUNTER — Encounter: Payer: Self-pay | Admitting: Internal Medicine

## 2010-10-29 ENCOUNTER — Ambulatory Visit (INDEPENDENT_AMBULATORY_CARE_PROVIDER_SITE_OTHER): Payer: Managed Care, Other (non HMO) | Admitting: Internal Medicine

## 2010-10-29 ENCOUNTER — Other Ambulatory Visit: Payer: Self-pay | Admitting: *Deleted

## 2010-10-29 ENCOUNTER — Encounter: Payer: Self-pay | Admitting: Internal Medicine

## 2010-10-29 VITALS — BP 112/80 | HR 56 | Temp 98.0°F | Resp 18 | Ht 64.0 in | Wt 191.0 lb

## 2010-10-29 DIAGNOSIS — L255 Unspecified contact dermatitis due to plants, except food: Secondary | ICD-10-CM

## 2010-10-29 DIAGNOSIS — E785 Hyperlipidemia, unspecified: Secondary | ICD-10-CM

## 2010-10-29 DIAGNOSIS — L237 Allergic contact dermatitis due to plants, except food: Secondary | ICD-10-CM

## 2010-10-29 DIAGNOSIS — I10 Essential (primary) hypertension: Secondary | ICD-10-CM

## 2010-10-29 DIAGNOSIS — E119 Type 2 diabetes mellitus without complications: Secondary | ICD-10-CM

## 2010-10-29 DIAGNOSIS — R21 Rash and other nonspecific skin eruption: Secondary | ICD-10-CM

## 2010-10-29 MED ORDER — TRIAMCINOLONE ACETONIDE 0.025 % EX OINT: TOPICAL_OINTMENT | Freq: Two times a day (BID) | CUTANEOUS | Status: DC

## 2010-10-29 MED ORDER — METHYLPREDNISOLONE ACETATE 40 MG/ML IJ SUSP
20.0000 mg | Freq: Once | INTRAMUSCULAR | Status: AC
  Administered 2010-10-29: 20 mg via INTRAMUSCULAR

## 2010-10-30 LAB — HEMOGLOBIN A1C
Hgb A1c MFr Bld: 6.4 % — ABNORMAL HIGH (ref <5.7)
Mean Plasma Glucose: 137 mg/dL — ABNORMAL HIGH (ref <117)

## 2010-10-30 LAB — MICROALBUMIN / CREATININE URINE RATIO
Creatinine, Urine: 93.2 mg/dL
Microalb Creat Ratio: 7.9 mg/g (ref 0.0–30.0)
Microalb, Ur: 0.74 mg/dL (ref 0.00–1.89)

## 2010-10-30 LAB — HEPATIC FUNCTION PANEL
Albumin: 4.3 g/dL (ref 3.5–5.2)
Bilirubin, Direct: 0.1 mg/dL (ref 0.0–0.3)
Total Bilirubin: 0.4 mg/dL (ref 0.3–1.2)

## 2010-10-30 LAB — BASIC METABOLIC PANEL
BUN: 16 mg/dL (ref 6–23)
Calcium: 9.6 mg/dL (ref 8.4–10.5)
Creat: 0.8 mg/dL (ref 0.50–1.35)
Glucose, Bld: 107 mg/dL — ABNORMAL HIGH (ref 70–99)

## 2010-10-30 LAB — LIPID PANEL: HDL: 38 mg/dL — ABNORMAL LOW (ref >39)

## 2010-10-31 DIAGNOSIS — R21 Rash and other nonspecific skin eruption: Secondary | ICD-10-CM | POA: Insufficient documentation

## 2010-10-31 NOTE — Assessment & Plan Note (Signed)
Suspect poison ivy. Administer depomedrol IM injxn and begin triamcinolone bid. Monitor fsbs carefully. Followup if no improvement or worsening.

## 2010-10-31 NOTE — Progress Notes (Signed)
  Subjective:    Patient ID: Keith Hardin, male    DOB: [birth year], 60 y.o.   MRN: [id number]  HPI Pt presents to clinic for evaluation of rash. Notes one week h/o rash involving bilateral arms/legs and chest. No oral or ocular involement. +pruritic. Attempting otc benadryl topical. No alleviating or exacerbating factors. No other complaints.  Reviewed [facility], medications and allergies.  Review of Systems  Constitutional: Negative for fever and chills.  Eyes: Negative for pain and redness.  Skin: Positive for rash. Negative for wound.       Objective:   Physical Exam  Nursing note and vitals reviewed. Constitutional: He appears well-developed and well-nourished. No distress.  HENT:  Head: Normocephalic and atraumatic.  Right Ear: External ear normal.  Left Ear: External ear normal.  Mouth/Throat: Oropharynx is clear and moist.  Eyes: Conjunctivae are normal. No scleral icterus.  Neurological: He is alert.  Skin: Skin is warm and dry. Rash noted. He is not diaphoretic.       Erythematous papular rash involving arms/legs/chest. No wounds or obvious secondary infxn  Psychiatric: He has a normal mood and affect.          Assessment & Plan:

## 2010-11-03 ENCOUNTER — Ambulatory Visit: Payer: Self-pay | Admitting: Internal Medicine

## 2010-11-03 ENCOUNTER — Ambulatory Visit (INDEPENDENT_AMBULATORY_CARE_PROVIDER_SITE_OTHER): Payer: Managed Care, Other (non HMO) | Admitting: Family

## 2010-11-03 ENCOUNTER — Encounter: Payer: Self-pay | Admitting: Family

## 2010-11-03 DIAGNOSIS — E119 Type 2 diabetes mellitus without complications: Secondary | ICD-10-CM

## 2010-11-03 DIAGNOSIS — I10 Essential (primary) hypertension: Secondary | ICD-10-CM

## 2010-11-03 DIAGNOSIS — E785 Hyperlipidemia, unspecified: Secondary | ICD-10-CM

## 2010-11-03 NOTE — Assessment & Plan Note (Signed)
Blood pressure is just slightly above goal today, plan to repeat in 3 months, if still above goal consider addition of amlodipine.

## 2010-11-03 NOTE — Progress Notes (Signed)
Subjective:    Patient ID: Keith Hardin, male    DOB: Aug 19, 1950, 60 y.o.   MRN: [id number]  HPI  DM2- on metformin and Januvia.  Watches his diet.  Denies symptomatic hypoglycemia. Denies polyuria or polydipsia.    HTN- Patient has been treated for Chronic HTN for quiet sometime.  He is on clonidine, lisinopril-HCTZ and metformin and well controlled. No associated S/S related to HTN.   Quality: chronic Modifying factor: meds Duration: Quite sometime Associated S/S: None.  The patient denies the following associated symptoms: Chest pain, dyspnea, blurred vision, headache, or lower extremity edema.     Review of Systems See HPI  Past Medical History  Diagnosis Date  . Allergy   . Diabetes mellitus     type II  . Hyperlipidemia   . Colon polyps   . Orchitis and epididymitis 01/2008    left with left hydrocele    History   Social History  . Marital Status: Married    Spouse Name: Cicero Duck    Number of Children: 0  . Years of Education: N/A   Occupational History  . Unemployed    Social History Main Topics  . Smoking status: Never Smoker   . Smokeless tobacco: Not on file  . Alcohol Use: No  . Drug Use: No  . Sexually Active:    Other Topics Concern  . Not on file   Social History Narrative   Occupation: unemployedMarried since 1999No children     No past surgical history on file.  Family History  Problem Relation Age of Onset  . Seizures Mother   . Cancer Brother     stomach  . Colon cancer Neg Hx     No Known Allergies  Current Outpatient Prescriptions on File Prior to Visit  Medication Sig Dispense Refill  . aspirin 81 MG tablet Take 81 mg by mouth daily.        . cetirizine (ZYRTEC) 10 MG tablet Take 10 mg by mouth daily.        . cloNIDine (CATAPRES) 0.1 MG tablet Take 0.1 mg by mouth 2 (two) times daily.        Marland Kitchen diltiazem (CARDIZEM CD) 240 MG 24 hr capsule Take 240 mg by mouth daily.        Marland Kitchen glucose blood (ACCU-CHEK AVIVA) test strip  Use as instructed three times daily to check blood sugar.       . labetalol (NORMODYNE) 200 MG tablet Take 200 mg by mouth 2 (two) times daily.        . Lancets (ACCU-CHEK MULTICLIX) lancets Use as instructed three times a day to check blood sugar.       . lisinopril-hydrochlorothiazide (PRINZIDE,ZESTORETIC) 20-25 MG per tablet Take 1 tablet by mouth daily.        . metFORMIN (GLUCOPHAGE XR) 500 MG 24 hr tablet Take 2 tablets by mouth  two times a day  360 tablet  2  . mometasone (NASONEX) 50 MCG/ACT nasal spray Place 2 sprays into the nose daily.        Marland Kitchen omeprazole (PRILOSEC) 20 MG capsule Take 20 mg by mouth daily.        . potassium chloride SA (K-DUR,KLOR-CON) 20 MEQ tablet Take 20 mEq by mouth daily.        . simvastatin (ZOCOR) 40 MG tablet TAKE ONE TABLET BY MOUTH ONE TIME DAILY  30 tablet  2  . sitaGLIPtan (JANUVIA) 100 MG tablet Take 1 tablet (100 mg total)  by mouth daily.  30 tablet  0  . triamcinolone (KENALOG) 0.025 % ointment Apply topically 2 (two) times daily.  60 g  0  . albuterol (PROAIR HFA) 108 (90 BASE) MCG/ACT inhaler 2 puffs every 4-6 hours as needed         BP 136/90  Pulse 84  Temp(Src) 98.2 F (36.8 C) (Oral)  Resp 16  Ht 5\' 4"  (1.626 m)  Wt 192 lb 1.9 oz (87.145 kg)  BMI 32.98 kg/m2  .     Objective:   Physical Exam    general: Awake, alert, pleasant African American male in no acute distress Cardiovascular: S1, S2, regular rate and rhythm Respiratory: Breath sounds are clear to auscultation bilaterally without wheezes rales or rhonchi Extremities: Please see diabetic foot exam Psychiatric patient is awake alert and oriented x4, calm and pleasant.    Assessment & Plan:

## 2010-11-03 NOTE — Assessment & Plan Note (Signed)
Lipids are at goal on simvastatin continue same.

## 2010-11-03 NOTE — Patient Instructions (Signed)
Please follow up in 3 months.  

## 2010-11-03 NOTE — Assessment & Plan Note (Signed)
Diabetes is at goal, he is up-to-date on his eye exam, plan to continue  metformin at current dose.

## 2010-11-06 ENCOUNTER — Telehealth: Payer: Self-pay | Admitting: Internal Medicine

## 2010-11-06 DIAGNOSIS — E119 Type 2 diabetes mellitus without complications: Secondary | ICD-10-CM

## 2010-11-06 MED ORDER — ACCU-CHEK MULTICLIX LANCETS MISC: Status: DC

## 2010-11-06 MED ORDER — GLUCOSE BLOOD VI STRP: ORAL_STRIP | Status: DC

## 2010-11-06 NOTE — Telephone Encounter (Signed)
PC from [place] at [facility] requesting refill on lancets and strips.  Creola Corn that lancets were sent electronically this AM.  Verbal OK given for strips as noted in MAR.

## 2010-11-06 NOTE — Telephone Encounter (Signed)
Last seen on 11/03/10, follow up on 02/03/11.  RX sent.

## 2010-11-06 NOTE — Telephone Encounter (Signed)
Addended by: Luisa Dago on: 11/06/2010 02:15 PM   Modules accepted: Orders

## 2010-11-06 NOTE — Telephone Encounter (Signed)
Refill- accu-chek multiclix lancets 102. ust to test three times daily as directed to check blood sugar. Qty 102. Last fill 6.23.11

## 2010-11-27 ENCOUNTER — Telehealth: Payer: Self-pay | Admitting: Internal Medicine

## 2010-11-27 MED ORDER — CLONIDINE HCL 0.1 MG PO TABS: 0.1000 mg | ORAL_TABLET | Freq: Two times a day (BID) | ORAL | Status: DC

## 2010-11-27 NOTE — Telephone Encounter (Signed)
Refill- clonidine 0.1mg  tablets. Take one tablet by mouth twice daily. Qty 180. Last fill 4.25.12

## 2010-11-27 NOTE — Telephone Encounter (Signed)
Is it okay to refill this medication?

## 2010-11-27 NOTE — Telephone Encounter (Signed)
Rx refill sent to pharmacy. 

## 2010-11-27 NOTE — Telephone Encounter (Signed)
Ok with rf 2

## 2010-12-21 ENCOUNTER — Telehealth: Payer: Self-pay | Admitting: Internal Medicine

## 2010-12-21 MED ORDER — POTASSIUM CHLORIDE CRYS ER 20 MEQ PO TBCR: 20.0000 meq | EXTENDED_RELEASE_TABLET | Freq: Every day | ORAL | Status: DC

## 2010-12-21 NOTE — Telephone Encounter (Signed)
Rx refill sent to pharmacy. 

## 2010-12-21 NOTE — Telephone Encounter (Signed)
Refill potassium chloride 20 take 1 per day   [facility] [address] in [place]

## 2011-01-07 ENCOUNTER — Telehealth: Payer: Self-pay | Admitting: Internal Medicine

## 2011-01-07 MED ORDER — SITAGLIPTIN PHOSPHATE 100 MG PO TABS: 100.0000 mg | ORAL_TABLET | Freq: Every day | ORAL | Status: DC

## 2011-01-07 NOTE — Telephone Encounter (Signed)
Samples of Januvia x 1 month left at front desk for patient pick up  Call placed to patient at [phone] answer. A detailed voice message was left informing patient Samples left at front desk for patient pick up.

## 2011-01-07 NOTE — Telephone Encounter (Signed)
Patient would like Januvia samples

## 2011-01-15 ENCOUNTER — Telehealth: Payer: Self-pay | Admitting: Internal Medicine

## 2011-01-15 NOTE — Telephone Encounter (Signed)
Patient states that he needs a refill of cartia? For diverticulitis. [facility] [address] in [place].

## 2011-01-18 NOTE — Telephone Encounter (Signed)
Do not see Cartia on current medication list. Left message for pt to return my call. Need to verify medication that pt is requesting.

## 2011-01-19 MED ORDER — DILTIAZEM HCL ER COATED BEADS 240 MG PO CP24: 240.0000 mg | ORAL_CAPSULE | Freq: Every day | ORAL | Status: DC

## 2011-01-19 NOTE — Telephone Encounter (Signed)
Spoke to pt, verified that [place] is Diltiazem and he is requesting refill for his blood pressure. Refill sent to pharmacy.

## 2011-01-26 LAB — BASIC METABOLIC PANEL
BUN: 14
CO2: 24
Calcium: 9
Chloride: 101
Creatinine, Ser: 0.81

## 2011-01-26 LAB — URINE MICROSCOPIC-ADD ON

## 2011-01-26 LAB — CBC
Hemoglobin: 12.6 — ABNORMAL LOW
MCHC: 32.3
MCV: 86.6
Platelets: 180
RDW: 14.3
WBC: 14.7 — ABNORMAL HIGH

## 2011-01-26 LAB — DIFFERENTIAL
Basophils Absolute: 0
Basophils Relative: 0
Eosinophils Absolute: 0
Lymphocytes Relative: 11 — ABNORMAL LOW
Monocytes Absolute: 0.8
Neutro Abs: 13.2 — ABNORMAL HIGH
Neutro Abs: 8.3 — ABNORMAL HIGH
Neutrophils Relative %: 90 — ABNORMAL HIGH

## 2011-01-26 LAB — URINALYSIS, ROUTINE W REFLEX MICROSCOPIC
Nitrite: POSITIVE — AB
Protein, ur: 30 — AB
Urobilinogen, UA: 1

## 2011-01-26 LAB — CULTURE, BLOOD (ROUTINE X 2)

## 2011-01-26 LAB — GONOCOCCUS CULTURE

## 2011-01-26 LAB — GLUCOSE, CAPILLARY
Glucose-Capillary: 110 — ABNORMAL HIGH
Glucose-Capillary: 132 — ABNORMAL HIGH
Glucose-Capillary: 134 — ABNORMAL HIGH
Glucose-Capillary: 143 — ABNORMAL HIGH
Glucose-Capillary: 174 — ABNORMAL HIGH
Glucose-Capillary: 228 — ABNORMAL HIGH

## 2011-01-26 LAB — URINE CULTURE: Culture: NO GROWTH

## 2011-02-03 ENCOUNTER — Encounter: Payer: Self-pay | Admitting: Family

## 2011-02-03 ENCOUNTER — Ambulatory Visit (INDEPENDENT_AMBULATORY_CARE_PROVIDER_SITE_OTHER): Payer: Managed Care, Other (non HMO) | Admitting: Family

## 2011-02-03 VITALS — BP 148/98 | HR 66 | Temp 97.8°F | Resp 16 | Ht 64.0 in | Wt 197.0 lb

## 2011-02-03 DIAGNOSIS — I10 Essential (primary) hypertension: Secondary | ICD-10-CM

## 2011-02-03 DIAGNOSIS — Z23 Encounter for immunization: Secondary | ICD-10-CM

## 2011-02-03 DIAGNOSIS — E119 Type 2 diabetes mellitus without complications: Secondary | ICD-10-CM

## 2011-02-03 DIAGNOSIS — E785 Hyperlipidemia, unspecified: Secondary | ICD-10-CM

## 2011-02-03 MED ORDER — CLONIDINE HCL 0.2 MG PO TABS: 0.2000 mg | ORAL_TABLET | Freq: Two times a day (BID) | ORAL | Status: DC

## 2011-02-03 NOTE — Progress Notes (Signed)
Subjective:    Patient ID: RUSHTON EARLY, male    DOB: 09-Nov-1950, 60 y.o.   MRN: [id number]  HPI  Mr.  Carswell is a 60 yr old male who presents today for routine follow up.  1) DM2- checks his sugars occasionally.  Readings generally 120-140.  Watching his diet, not exercising regularly.    2) HTN-denies cp, sob, occasional dependent edema at work. Reports + med compliance.  3) hyperlipidemia- on simvastatin       Review of Systems    see HPI  Past Medical History  Diagnosis Date  . Allergy   . Diabetes mellitus     type II  . Hyperlipidemia   . Colon polyps   . Orchitis and epididymitis 01/2008    left with left hydrocele    History   Social History  . Marital Status: Married    Spouse Name: Cicero Duck    Number of Children: 0  . Years of Education: N/A   Occupational History  . Unemployed    Social History Main Topics  . Smoking status: Never Smoker   . Smokeless tobacco: Not on file  . Alcohol Use: No  . Drug Use: No  . Sexually Active:    Other Topics Concern  . Not on file   Social History Narrative   Occupation: unemployedMarried since 1999No children     No past surgical history on file.  Family History  Problem Relation Age of Onset  . Seizures Mother   . Cancer Brother     stomach  . Colon cancer Neg Hx     No Known Allergies  Current Outpatient Prescriptions on File Prior to Visit  Medication Sig Dispense Refill  . albuterol (PROAIR HFA) 108 (90 BASE) MCG/ACT inhaler 2 puffs every 4-6 hours as needed       . aspirin 81 MG tablet Take 81 mg by mouth daily.        . cetirizine (ZYRTEC) 10 MG tablet Take 10 mg by mouth daily.        Marland Kitchen diltiazem (CARDIZEM CD) 240 MG 24 hr capsule Take 1 capsule (240 mg total) by mouth daily.  30 capsule  0  . glucose blood (ACCU-CHEK AVIVA) test strip Use as instructed three times daily to check blood sugar.  100 each  3  . labetalol (NORMODYNE) 200 MG tablet Take 200 mg by mouth 2 (two) times daily.         . Lancets (ACCU-CHEK MULTICLIX) lancets Use as instructed three times a day to check blood sugar. DX 250.00  102 each  3  . lisinopril-hydrochlorothiazide (PRINZIDE,ZESTORETIC) 20-25 MG per tablet Take 1 tablet by mouth daily.        . metFORMIN (GLUCOPHAGE XR) 500 MG 24 hr tablet Take 2 tablets by mouth  two times a day  360 tablet  2  . potassium chloride SA (K-DUR,KLOR-CON) 20 MEQ tablet Take 1 tablet (20 mEq total) by mouth daily.  30 tablet  3  . simvastatin (ZOCOR) 40 MG tablet TAKE ONE TABLET BY MOUTH ONE TIME DAILY  30 tablet  2  . sitaGLIPtin (JANUVIA) 100 MG tablet Take 1 tablet (100 mg total) by mouth daily.  30 tablet  0  . omeprazole (PRILOSEC) 20 MG capsule Take 20 mg by mouth daily.          BP 148/98  Pulse 66  Temp(Src) 97.8 F (36.6 C) (Oral)  Resp 16  Ht 5\' 4"  (1.626 m)  Wt  197 lb (89.359 kg)  BMI 33.82 kg/m2    Objective:   Physical Exam  Constitutional: He appears well-developed and well-nourished.  Cardiovascular: Normal rate and regular rhythm.   No murmur heard. Pulmonary/Chest: Effort normal and breath sounds normal. No respiratory distress. He has no wheezes. He has no rales. He exhibits no tenderness.  Musculoskeletal: He exhibits no edema.  Psychiatric: He has a normal mood and affect. His behavior is normal. Judgment and thought content normal.          Assessment & Plan:

## 2011-02-03 NOTE — Patient Instructions (Addendum)
Please complete your lab work on the first floor.  Follow up in 1 month.

## 2011-02-04 LAB — BASIC METABOLIC PANEL
CO2: 29 mEq/L (ref 19–32)
Chloride: 104 mEq/L (ref 96–112)
Glucose, Bld: 116 mg/dL — ABNORMAL HIGH (ref 70–99)
Potassium: 3.5 mEq/L (ref 3.5–5.3)
Sodium: 143 mEq/L (ref 135–145)

## 2011-02-05 ENCOUNTER — Encounter: Payer: Self-pay | Admitting: Family

## 2011-02-07 NOTE — Assessment & Plan Note (Signed)
On Statin, tolerating. Lipid, LFTs stable last visit.

## 2011-02-07 NOTE — Assessment & Plan Note (Signed)
BP Readings from Last 3 Encounters:  02/03/11 148/98  11/03/10 136/90  10/29/10 112/80  Deteriorated.  Will increase clonidine to 0.2mg  bid.

## 2011-02-07 NOTE — Assessment & Plan Note (Signed)
Clinically stable on januvia and metformin.  Check A1C, bmet.

## 2011-02-14 ENCOUNTER — Other Ambulatory Visit: Payer: Self-pay | Admitting: Family

## 2011-03-05 ENCOUNTER — Ambulatory Visit (INDEPENDENT_AMBULATORY_CARE_PROVIDER_SITE_OTHER): Payer: BC Managed Care – PPO | Admitting: Family

## 2011-03-05 ENCOUNTER — Encounter: Payer: Self-pay | Admitting: Family

## 2011-03-05 DIAGNOSIS — I10 Essential (primary) hypertension: Secondary | ICD-10-CM

## 2011-03-05 MED ORDER — CLONIDINE HCL 0.3 MG PO TABS: 0.3000 mg | ORAL_TABLET | Freq: Two times a day (BID) | ORAL | Status: DC

## 2011-03-05 NOTE — Progress Notes (Signed)
Subjective:    Patient ID: Keith Hardin, male    DOB: [birth year], 60 y.o.   MRN: [id number]  HPI  Patient presents today for followup of hypertension.  Patient has been treated for chronic HTN for quiet sometime. He is currently on Cartia, clonidine, labetalol, and lisinopril HCTZ with fair control. No associated S/S related to HTN.   Quality: chronic Modifying factor: meds Duration: Quite sometime Associated S/S: None.  The patient denies the following associated symptoms: Chest pain, dyspnea, blurred vision, headache, or lower extremity edema.    Review of Systems    see HPI  Past Medical History  Diagnosis Date  . Allergy   . Diabetes mellitus     type II  . Hyperlipidemia   . Colon polyps   . Orchitis and epididymitis 01/2008    left with left hydrocele    History   Social History  . Marital Status: Married    Spouse Name: Cicero Duck    Number of Children: 0  . Years of Education: N/A   Occupational History  . Unemployed    Social History Main Topics  . Smoking status: Never Smoker   . Smokeless tobacco: Not on file  . Alcohol Use: No  . Drug Use: No  . Sexually Active:    Other Topics Concern  . Not on file   Social History Narrative   Occupation: unemployedMarried since 1999No children     No past surgical history on file.  Family History  Problem Relation Age of Onset  . Seizures Mother   . Cancer Brother     stomach  . Colon cancer Neg Hx     No Known Allergies  Current Outpatient Prescriptions on File Prior to Visit  Medication Sig Dispense Refill  . albuterol (PROAIR HFA) 108 (90 BASE) MCG/ACT inhaler 2 puffs every 4-6 hours as needed       . aspirin 81 MG tablet Take 81 mg by mouth daily.        Marland Kitchen CARTIA XT 240 MG 24 hr capsule TAKE ONE CAPSULE BY MOUTH EVERY DAY  30 capsule  1  . cetirizine (ZYRTEC) 10 MG tablet Take 10 mg by mouth daily.        Marland Kitchen glucose blood (ACCU-CHEK AVIVA) test strip Use as instructed three times daily to  check blood sugar.  100 each  3  . labetalol (NORMODYNE) 200 MG tablet Take 200 mg by mouth 2 (two) times daily.        . Lancets (ACCU-CHEK MULTICLIX) lancets Use as instructed three times a day to check blood sugar. DX 250.00  102 each  3  . lisinopril-hydrochlorothiazide (PRINZIDE,ZESTORETIC) 20-25 MG per tablet Take 1 tablet by mouth daily.        . metFORMIN (GLUCOPHAGE XR) 500 MG 24 hr tablet Take 2 tablets by mouth  two times a day  360 tablet  2  . omeprazole (PRILOSEC) 20 MG capsule Take 20 mg by mouth daily.        . potassium chloride SA (K-DUR,KLOR-CON) 20 MEQ tablet Take 1 tablet (20 mEq total) by mouth daily.  30 tablet  3  . simvastatin (ZOCOR) 40 MG tablet TAKE ONE TABLET BY MOUTH ONE TIME DAILY  30 tablet  2  . sitaGLIPtin (JANUVIA) 100 MG tablet Take 1 tablet (100 mg total) by mouth daily.  30 tablet  0    BP 144/90  Pulse 66  Temp(Src) 97.8 F (36.6 C) (Oral)  Resp 16  Ht 5\' 4"  (1.626 m)  Wt 194 lb 1.9 oz (88.052 kg)  BMI 33.32 kg/m2    Objective:   Physical Exam  Constitutional: He appears well-developed and well-nourished. No distress.  Cardiovascular: Normal rate and regular rhythm.   No murmur heard. Pulmonary/Chest: Effort normal and breath sounds normal. No respiratory distress. He has no wheezes. He has no rales.  Musculoskeletal: He exhibits no edema.  Psychiatric: He has a normal mood and affect. His behavior is normal.          Assessment & Plan:

## 2011-03-05 NOTE — Patient Instructions (Signed)
Follow-up in 6 weeks

## 2011-03-07 NOTE — Assessment & Plan Note (Addendum)
BP Readings from Last 3 Encounters:  03/05/11 144/90  02/03/11 148/98  11/03/10 136/90  BP is slightly improved today.  Goal for him due to diabetes is <130/80. Will increase clonidine.

## 2011-03-15 ENCOUNTER — Telehealth: Payer: Self-pay | Admitting: Family

## 2011-03-15 NOTE — Telephone Encounter (Signed)
Patient is requesting samples of junevia.

## 2011-03-16 MED ORDER — SITAGLIPTIN PHOSPHATE 100 MG PO TABS: 100.0000 mg | ORAL_TABLET | Freq: Every day | ORAL | Status: DC

## 2011-03-16 NOTE — Telephone Encounter (Signed)
Pt notified that samples have been placed at front desk for pick up.

## 2011-03-17 ENCOUNTER — Telehealth: Payer: Self-pay | Admitting: Family

## 2011-03-17 DIAGNOSIS — E119 Type 2 diabetes mellitus without complications: Secondary | ICD-10-CM

## 2011-03-17 MED ORDER — METFORMIN HCL ER 500 MG PO TB24: ORAL_TABLET | ORAL | Status: DC

## 2011-03-17 NOTE — Telephone Encounter (Signed)
Refill- metformin er 500mg  24 hr tabs. Take 2 tablets by mouth twice daily. Qty 360 last fill 8.21.12

## 2011-03-17 NOTE — Telephone Encounter (Signed)
Rx refill sent to pharmacy. 

## 2011-04-14 ENCOUNTER — Telehealth: Payer: Self-pay | Admitting: Family

## 2011-04-14 NOTE — Telephone Encounter (Signed)
Call placed to patient at [phone], no answer. A detailed voice message was left informing patient samples left at front desk for patient pick up.

## 2011-04-14 NOTE — Telephone Encounter (Signed)
Patient is requesting samples of junavia

## 2011-04-15 ENCOUNTER — Other Ambulatory Visit: Payer: Self-pay | Admitting: Family

## 2011-04-16 NOTE — Telephone Encounter (Signed)
Last seen 03/05/11, follow up on 04/30/11.  RX sent for 30 days.

## 2011-04-30 ENCOUNTER — Ambulatory Visit (INDEPENDENT_AMBULATORY_CARE_PROVIDER_SITE_OTHER): Payer: BC Managed Care – PPO | Admitting: Family

## 2011-04-30 ENCOUNTER — Encounter: Payer: Self-pay | Admitting: Family

## 2011-04-30 VITALS — BP 142/90 | HR 72 | Temp 98.0°F | Resp 16 | Ht 64.0 in | Wt 199.1 lb

## 2011-04-30 DIAGNOSIS — E119 Type 2 diabetes mellitus without complications: Secondary | ICD-10-CM

## 2011-04-30 DIAGNOSIS — R5383 Other fatigue: Secondary | ICD-10-CM

## 2011-04-30 DIAGNOSIS — I10 Essential (primary) hypertension: Secondary | ICD-10-CM

## 2011-04-30 DIAGNOSIS — E785 Hyperlipidemia, unspecified: Secondary | ICD-10-CM

## 2011-04-30 DIAGNOSIS — E291 Testicular hypofunction: Secondary | ICD-10-CM

## 2011-04-30 MED ORDER — SITAGLIPTIN PHOSPHATE 100 MG PO TABS: 100.0000 mg | ORAL_TABLET | Freq: Every day | ORAL | Status: DC

## 2011-04-30 MED ORDER — DILTIAZEM HCL ER COATED BEADS 240 MG PO CP24: 240.0000 mg | ORAL_CAPSULE | Freq: Every day | ORAL | Status: DC

## 2011-04-30 MED ORDER — POTASSIUM CHLORIDE CRYS ER 20 MEQ PO TBCR: 20.0000 meq | EXTENDED_RELEASE_TABLET | Freq: Every day | ORAL | Status: DC

## 2011-04-30 MED ORDER — LABETALOL HCL 300 MG PO TABS: 300.0000 mg | ORAL_TABLET | Freq: Two times a day (BID) | ORAL | Status: DC

## 2011-04-30 MED ORDER — SIMVASTATIN 40 MG PO TABS: 40.0000 mg | ORAL_TABLET | Freq: Every day | ORAL | Status: DC

## 2011-04-30 MED ORDER — LISINOPRIL-HYDROCHLOROTHIAZIDE 20-25 MG PO TABS: 1.0000 | ORAL_TABLET | Freq: Every day | ORAL | Status: DC

## 2011-04-30 MED ORDER — CLONIDINE HCL 0.3 MG PO TABS: 0.3000 mg | ORAL_TABLET | Freq: Two times a day (BID) | ORAL | Status: DC

## 2011-04-30 NOTE — Patient Instructions (Signed)
Please follow up in 6 weeks for a blood pressure check. Complete your blood work prior to leaving today.  Happy New Year!

## 2011-04-30 NOTE — Progress Notes (Signed)
Subjective:    Patient ID: Keith Hardin, male    DOB: [birth year], 61 y.o.   MRN: [id number]  HPI  Mr.  Hardin is a 61 yr old male who presents today for follow up.   HTN- on cartia xt, clonidine, zestoretic, labatalol.  Clonidine was increased last visit.   DM2-  Currently on Metformin and januvia.   Hyperlipidemia- mainatained on simvastatin. Denies myalgias.  Fatigue/moodiness- reports that he often has low energy.  Wife comments that he can be moody.  Wife suggested that he have his thyroid level drawn.     Review of Systems    see HPI  Past Medical History  Diagnosis Date  . Allergy   . Diabetes mellitus     type II  . Hyperlipidemia   . Colon polyps   . Orchitis and epididymitis 01/2008    left with left hydrocele  . Hypogonadism male 05/02/2011    History   Social History  . Marital Status: Married    Spouse Name: Cicero Duck    Number of Children: 0  . Years of Education: N/A   Occupational History  . Unemployed    Social History Main Topics  . Smoking status: Never Smoker   . Smokeless tobacco: Not on file  . Alcohol Use: No  . Drug Use: No  . Sexually Active:    Other Topics Concern  . Not on file   Social History Narrative   Occupation: unemployedMarried since 1999No children     No past surgical history on file.  Family History  Problem Relation Age of Onset  . Seizures Mother   . Cancer Brother     stomach  . Colon cancer Neg Hx     No Known Allergies  Current Outpatient Prescriptions on File Prior to Visit  Medication Sig Dispense Refill  . albuterol (PROAIR HFA) 108 (90 BASE) MCG/ACT inhaler 2 puffs every 4-6 hours as needed       . aspirin 81 MG tablet Take 81 mg by mouth daily.        . cetirizine (ZYRTEC) 10 MG tablet Take 10 mg by mouth daily.        Marland Kitchen diltiazem (CARTIA XT) 240 MG 24 hr capsule Take 1 capsule (240 mg total) by mouth daily.  30 capsule  5  . glucose blood (ACCU-CHEK AVIVA) test strip Use as instructed three  times daily to check blood sugar.  100 each  3  . Lancets (ACCU-CHEK MULTICLIX) lancets Use as instructed three times a day to check blood sugar. DX 250.00  102 each  3  . metFORMIN (GLUCOPHAGE XR) 500 MG 24 hr tablet Take 2 tablets by mouth  two times a day  360 tablet  1  . omeprazole (PRILOSEC) 20 MG capsule Take 20 mg by mouth daily.        . potassium chloride SA (K-DUR,KLOR-CON) 20 MEQ tablet Take 1 tablet (20 mEq total) by mouth daily.  30 tablet  5    BP 142/90  Pulse 72  Temp(Src) 98 F (36.7 C) (Oral)  Resp 16  Ht 5\' 4"  (1.626 m)  Wt 199 lb 1.3 oz (90.302 kg)  BMI 34.17 kg/m2    Objective:   Physical Exam  Constitutional: He appears well-developed and well-nourished. No distress.  Cardiovascular: Normal rate and regular rhythm.   No murmur heard. Pulmonary/Chest: Effort normal and breath sounds normal. No respiratory distress. He has no wheezes. He has no rales. He exhibits no tenderness.  Musculoskeletal: He exhibits no edema.  Psychiatric: He has a normal mood and affect. His behavior is normal. Judgment and thought content normal.          Assessment & Plan:

## 2011-05-01 LAB — BASIC METABOLIC PANEL
BUN: 15 mg/dL (ref 6–23)
CO2: 24 mEq/L (ref 19–32)
Glucose, Bld: 151 mg/dL — ABNORMAL HIGH (ref 70–99)
Potassium: 3.6 mEq/L (ref 3.5–5.3)

## 2011-05-01 LAB — HEMOGLOBIN A1C: Hgb A1c MFr Bld: 7.2 % — ABNORMAL HIGH (ref <5.7)

## 2011-05-01 LAB — HEPATIC FUNCTION PANEL
ALT: 22 U/L (ref 0–53)
AST: 21 U/L (ref 0–37)
Bilirubin, Direct: 0.1 mg/dL (ref 0.0–0.3)
Indirect Bilirubin: 0.3 mg/dL (ref 0.0–0.9)
Total Protein: 7 g/dL (ref 6.0–8.3)

## 2011-05-02 ENCOUNTER — Encounter: Payer: Self-pay | Admitting: Family

## 2011-05-02 ENCOUNTER — Telehealth: Payer: Self-pay | Admitting: Family

## 2011-05-02 DIAGNOSIS — E291 Testicular hypofunction: Secondary | ICD-10-CM

## 2011-05-02 HISTORY — DX: Testicular hypofunction: E29.1

## 2011-05-02 NOTE — Telephone Encounter (Addendum)
Pls call pt and let him know that his testosterone level is low. I would like to refer him to Urology for testosterone treatment. Referral is pended. Also, A1C was 6.1 now up to 7.2.  He should work hard on diabetic diet and exercise. We want this number under 7.

## 2011-05-02 NOTE — Assessment & Plan Note (Signed)
BP Readings from Last 3 Encounters:  04/30/11 142/90  03/05/11 144/90  02/03/11 148/98  BP control is slightly above goal of 130/80.  Will increase labetalol from 200mg  bid to 300mg  bid. Continue catapres, cartia, zestoretic.  Obtain BMET.

## 2011-05-02 NOTE — Assessment & Plan Note (Signed)
Obtain A1C, up to date on flu shot.

## 2011-05-02 NOTE — Assessment & Plan Note (Signed)
Not fasting today, obtain LFT, continue statin.

## 2011-05-02 NOTE — Assessment & Plan Note (Signed)
Due to fatigue, a serum testosterone level is obtained and is noted to be low.  Will refer to urology for treatment.  See phone note.

## 2011-05-03 LAB — TESTOSTERONE, FREE, TOTAL, SHBG: Sex Hormone Binding: 19 nmol/L (ref 13–71)

## 2011-05-03 NOTE — Telephone Encounter (Signed)
Left message on machine to return my call. 

## 2011-05-04 NOTE — Telephone Encounter (Signed)
Left message on machine to return my call. 

## 2011-05-05 NOTE — Telephone Encounter (Signed)
Pt notified and advised to call us Monday if he hasn't heard from [place] re: urology appt.

## 2011-05-18 ENCOUNTER — Other Ambulatory Visit: Payer: Self-pay | Admitting: Family

## 2011-05-18 NOTE — Telephone Encounter (Signed)
Refills were sent for Cartia XT and Potassium on 04/30/11. Pharmacy should check refills on file.

## 2011-05-18 NOTE — Telephone Encounter (Signed)
Left detailed message on pharmacy voicemail to check refills on file and denial sent for both requests.

## 2011-06-11 ENCOUNTER — Ambulatory Visit: Payer: BC Managed Care – PPO | Admitting: Family

## 2011-06-18 ENCOUNTER — Ambulatory Visit (INDEPENDENT_AMBULATORY_CARE_PROVIDER_SITE_OTHER): Payer: BC Managed Care – PPO | Admitting: Family

## 2011-06-18 ENCOUNTER — Encounter: Payer: Self-pay | Admitting: Family

## 2011-06-18 DIAGNOSIS — I10 Essential (primary) hypertension: Secondary | ICD-10-CM

## 2011-06-18 MED ORDER — HYDROCHLOROTHIAZIDE 25 MG PO TABS: 25.0000 mg | ORAL_TABLET | Freq: Every day | ORAL | Status: DC

## 2011-06-18 MED ORDER — LISINOPRIL 40 MG PO TABS: 40.0000 mg | ORAL_TABLET | Freq: Every day | ORAL | Status: DC

## 2011-06-18 NOTE — Assessment & Plan Note (Signed)
Deteriorated.  BP Readings from Last 3 Encounters:  06/18/11 150/94  04/30/11 142/90  03/05/11 144/90   Will plan to increase lisinopril from 20 to 40mg .  Follow up in 2 weeks for BMET and BP recheck.  Hold potassium supplement due to increasing dose of beta blocker.

## 2011-06-18 NOTE — Patient Instructions (Signed)
Please follow up in 2 weeks

## 2011-06-18 NOTE — Progress Notes (Signed)
Subjective:    Patient ID: ARNETT DUDDY, male    DOB: May 19, 1950, 61 y.o.   MRN: [id number]  HPI  Mr.  Main is a 61 yr old male who presents today for follow up of his HTN.  He reports that he has been compliant with his medications.  Labetalol was increased last visit.     Review of Systems    see HPI  Past Medical History  Diagnosis Date  . Allergy   . Diabetes mellitus     type II  . Hyperlipidemia   . Colon polyps   . Orchitis and epididymitis 01/2008    left with left hydrocele  . Hypogonadism male 05/02/2011    History   Social History  . Marital Status: Married    Spouse Name: Cicero Duck    Number of Children: 0  . Years of Education: N/A   Occupational History  . Unemployed    Social History Main Topics  . Smoking status: Never Smoker   . Smokeless tobacco: Not on file  . Alcohol Use: No  . Drug Use: No  . Sexually Active:    Other Topics Concern  . Not on file   Social History Narrative   Occupation: unemployedMarried since 1999No children     No past surgical history on file.  Family History  Problem Relation Age of Onset  . Seizures Mother   . Cancer Brother     stomach  . Colon cancer Neg Hx     No Known Allergies  Current Outpatient Prescriptions on File Prior to Visit  Medication Sig Dispense Refill  . albuterol (PROAIR HFA) 108 (90 BASE) MCG/ACT inhaler 2 puffs every 4-6 hours as needed       . aspirin 81 MG tablet Take 81 mg by mouth daily.        . cetirizine (ZYRTEC) 10 MG tablet Take 10 mg by mouth daily.        . cloNIDine (CATAPRES) 0.3 MG tablet Take 1 tablet (0.3 mg total) by mouth 2 (two) times daily.  60 tablet  5  . diltiazem (CARTIA XT) 240 MG 24 hr capsule Take 1 capsule (240 mg total) by mouth daily.  30 capsule  5  . glucose blood (ACCU-CHEK AVIVA) test strip Use as instructed three times daily to check blood sugar.  100 each  3  . labetalol (NORMODYNE) 300 MG tablet Take 1 tablet (300 mg total) by mouth 2 (two)  times daily.  60 tablet  2  . Lancets (ACCU-CHEK MULTICLIX) lancets Use as instructed three times a day to check blood sugar. DX 250.00  102 each  3  . metFORMIN (GLUCOPHAGE XR) 500 MG 24 hr tablet Take 2 tablets by mouth  two times a day  360 tablet  1  . omeprazole (PRILOSEC) 20 MG capsule Take 20 mg by mouth daily.        . simvastatin (ZOCOR) 40 MG tablet Take 1 tablet (40 mg total) by mouth at bedtime.  90 tablet  1  . sitaGLIPtin (JANUVIA) 100 MG tablet Take 1 tablet (100 mg total) by mouth daily.  30 tablet  5    BP 150/94  Pulse 58  Temp(Src) 97.7 F (36.5 C) (Oral)  Resp 16  Ht 5\' 4"  (1.626 m)  Wt 206 lb 1.9 oz (93.495 kg)  BMI 35.38 kg/m2  SpO2 98%    Objective:   Physical Exam  Constitutional: He appears well-developed and well-nourished. No distress.  Neck:  Neck supple.  Cardiovascular: Normal rate and regular rhythm.   No murmur heard. Pulmonary/Chest: Effort normal and breath sounds normal. No respiratory distress. He has no wheezes. He has no rales. He exhibits no tenderness.  Musculoskeletal: He exhibits no edema.  Lymphadenopathy:    He has no cervical adenopathy.  Psychiatric: He has a normal mood and affect. His behavior is normal. Judgment and thought content normal.          Assessment & Plan:

## 2011-07-02 ENCOUNTER — Encounter: Payer: Self-pay | Admitting: Family

## 2011-07-02 ENCOUNTER — Ambulatory Visit (INDEPENDENT_AMBULATORY_CARE_PROVIDER_SITE_OTHER): Payer: BC Managed Care – PPO | Admitting: Family

## 2011-07-02 VITALS — BP 140/90 | HR 54 | Temp 97.9°F | Resp 16 | Ht 64.0 in | Wt 203.0 lb

## 2011-07-02 DIAGNOSIS — M25519 Pain in unspecified shoulder: Secondary | ICD-10-CM

## 2011-07-02 DIAGNOSIS — I10 Essential (primary) hypertension: Secondary | ICD-10-CM

## 2011-07-02 MED ORDER — LISINOPRIL 40 MG PO TABS: 40.0000 mg | ORAL_TABLET | Freq: Every day | ORAL | Status: DC

## 2011-07-02 MED ORDER — SITAGLIPTIN PHOSPHATE 100 MG PO TABS: 100.0000 mg | ORAL_TABLET | Freq: Every day | ORAL | Status: DC

## 2011-07-02 NOTE — Patient Instructions (Signed)
Complete your blood work prior to leaving.  Follow up in 2 months.

## 2011-07-02 NOTE — Progress Notes (Signed)
Subjective:    Patient ID: Keith Hardin, male    DOB: [birth year], 61 y.o.   MRN: [id number]  HPI  Mr.  Hardin is a 61 yr old male who presents today for follow up of his blood pressure. Last visit the patient's lisinopril was increased from 20mg  to 40mg .  He is tolerating this increase without any difficulty. He also continues on diltiazem, clonidien, hctz and labetalol.    He also reports some left sided shoulder pain.  Review of Systems  Respiratory: Negative for shortness of breath.   Cardiovascular: Negative for chest pain.    See HPI  Past Medical History  Diagnosis Date  . Allergy   . Diabetes mellitus     type II  . Hyperlipidemia   . Colon polyps   . Orchitis and epididymitis 01/2008    left with left hydrocele  . Hypogonadism male 05/02/2011    History   Social History  . Marital Status: Married    Spouse Name: Cicero Duck    Number of Children: 0  . Years of Education: N/A   Occupational History  . Unemployed    Social History Main Topics  . Smoking status: Never Smoker   . Smokeless tobacco: Not on file  . Alcohol Use: No  . Drug Use: No  . Sexually Active:    Other Topics Concern  . Not on file   Social History Narrative   Occupation: unemployedMarried since 1999No children     No past surgical history on file.  Family History  Problem Relation Age of Onset  . Seizures Mother   . Cancer Brother     stomach  . Colon cancer Neg Hx     No Known Allergies  Current Outpatient Prescriptions on File Prior to Visit  Medication Sig Dispense Refill  . albuterol (PROAIR HFA) 108 (90 BASE) MCG/ACT inhaler 2 puffs every 4-6 hours as needed       . aspirin 81 MG tablet Take 81 mg by mouth daily.        . cetirizine (ZYRTEC) 10 MG tablet Take 10 mg by mouth daily.        . cloNIDine (CATAPRES) 0.3 MG tablet Take 1 tablet (0.3 mg total) by mouth 2 (two) times daily.  60 tablet  5  . diltiazem (CARTIA XT) 240 MG 24 hr capsule Take 1 capsule (240 mg  total) by mouth daily.  30 capsule  5  . glucose blood (ACCU-CHEK AVIVA) test strip Use as instructed three times daily to check blood sugar.  100 each  3  . hydrochlorothiazide (HYDRODIURIL) 25 MG tablet Take 1 tablet (25 mg total) by mouth daily.  30 tablet  0  . labetalol (NORMODYNE) 300 MG tablet Take 1 tablet (300 mg total) by mouth 2 (two) times daily.  60 tablet  2  . Lancets (ACCU-CHEK MULTICLIX) lancets Use as instructed three times a day to check blood sugar. DX 250.00  102 each  3  . metFORMIN (GLUCOPHAGE XR) 500 MG 24 hr tablet Take 2 tablets by mouth  two times a day  360 tablet  1  . omeprazole (PRILOSEC) 20 MG capsule Take 20 mg by mouth daily.        . simvastatin (ZOCOR) 40 MG tablet Take 1 tablet (40 mg total) by mouth at bedtime.  90 tablet  1    BP 140/90  Pulse 54  Temp(Src) 97.9 F (36.6 C) (Oral)  Resp 16  Ht 5\' 4"  (1.626  m)  Wt 203 lb (92.08 kg)  BMI 34.84 kg/m2  SpO2 98%        Objective:   Physical Exam  Constitutional: He appears well-developed and well-nourished. No distress.  Cardiovascular: Normal rate and regular rhythm.   No murmur heard. Pulmonary/Chest: Effort normal and breath sounds normal. No respiratory distress. He has no wheezes. He has no rales. He exhibits no tenderness.  Musculoskeletal: He exhibits no edema.  Psychiatric: He has a normal mood and affect. His behavior is normal. Judgment and thought content normal.          Assessment & Plan:

## 2011-07-03 LAB — BASIC METABOLIC PANEL
Chloride: 103 mEq/L (ref 96–112)
Potassium: 3.7 mEq/L (ref 3.5–5.3)
Sodium: 142 mEq/L (ref 135–145)

## 2011-07-04 ENCOUNTER — Encounter: Payer: Self-pay | Admitting: Family

## 2011-07-05 DIAGNOSIS — M25519 Pain in unspecified shoulder: Secondary | ICD-10-CM | POA: Insufficient documentation

## 2011-07-05 NOTE — Progress Notes (Signed)
Addended by: Sandford Craze on: 07/05/2011 02:09 PM   Modules accepted: Level of Service

## 2011-07-05 NOTE — Assessment & Plan Note (Signed)
Like OA, will monitor. If symptoms worsen, consider further imaging.

## 2011-07-05 NOTE — Assessment & Plan Note (Addendum)
BP Readings from Last 3 Encounters:  07/02/11 140/90  06/18/11 150/94  04/30/11 142/90  BP is improved.  Will continue current meds.  Stressed importance of low sodium diet, exercise, weight loss. Obtain BMET to evaluate potassium.

## 2011-07-09 ENCOUNTER — Other Ambulatory Visit: Payer: Self-pay | Admitting: Family

## 2011-07-27 ENCOUNTER — Other Ambulatory Visit: Payer: Self-pay | Admitting: Family

## 2011-07-30 ENCOUNTER — Ambulatory Visit (INDEPENDENT_AMBULATORY_CARE_PROVIDER_SITE_OTHER): Payer: BC Managed Care – PPO | Admitting: Internal Medicine

## 2011-07-30 ENCOUNTER — Ambulatory Visit: Payer: BC Managed Care – PPO | Admitting: Internal Medicine

## 2011-07-30 ENCOUNTER — Encounter: Payer: Self-pay | Admitting: Internal Medicine

## 2011-07-30 VITALS — BP 100/60 | HR 53 | Temp 97.9°F | Resp 16

## 2011-07-30 DIAGNOSIS — K219 Gastro-esophageal reflux disease without esophagitis: Secondary | ICD-10-CM | POA: Insufficient documentation

## 2011-07-30 DIAGNOSIS — J309 Allergic rhinitis, unspecified: Secondary | ICD-10-CM

## 2011-07-30 NOTE — Progress Notes (Signed)
  Subjective:    Patient ID: Keith Hardin, male    DOB: June 13, 1950, 61 y.o.   MRN: [id number]  HPI Pt presents to clinic for evaluation of GERD and nasal drainage. Notes several day h/o heartburn with reflux to the back of the throat. Initially noted mild abd pain but this resolved. Denies dysphagia or chest pain. No food triggers. First occurrence in several months. Also notes nasal drainage and congestion without recent illness. No f/c. Using otc saline spray without significant improvement. Zyrtec in the past has been sedating.   Past Medical History  Diagnosis Date  . Allergy   . Diabetes mellitus     type II  . Hyperlipidemia   . Colon polyps   . Orchitis and epididymitis 01/2008    left with left hydrocele  . Hypogonadism male 05/02/2011   No past surgical history on file.  reports that he has never smoked. He has never used smokeless tobacco. He reports that he does not drink alcohol or use illicit drugs. family history includes Cancer in his brother and Seizures in his mother.  There is no history of Colon cancer. No Known Allergies   Review of Systems see hpi     Objective:   Physical Exam  Nursing note and vitals reviewed. Constitutional: He appears well-developed and well-nourished. No distress.  HENT:  Head: Normocephalic and atraumatic.  Right Ear: External ear normal.  Left Ear: External ear normal.  Nose: Nose normal.  Mouth/Throat: Oropharynx is clear and moist. No oropharyngeal exudate.  Eyes: Conjunctivae are normal. Right eye exhibits no discharge. Left eye exhibits no discharge. No scleral icterus.  Neurological: He is alert.  Skin: Skin is warm and dry. He is not diaphoretic.  Psychiatric: He has a normal mood and affect.          Assessment & Plan:

## 2011-07-30 NOTE — Assessment & Plan Note (Signed)
Attempt allegra prn. Followup if no improvement or worsening.

## 2011-07-30 NOTE — Assessment & Plan Note (Signed)
Trial of samples nexium 40mg  po qd x 2wks. Followup if no improvement or worsening.

## 2011-08-31 ENCOUNTER — Telehealth: Payer: Self-pay | Admitting: Family

## 2011-08-31 NOTE — Telephone Encounter (Signed)
Patient is requesting [place] and nexium samples

## 2011-09-01 ENCOUNTER — Encounter: Payer: Self-pay | Admitting: Family

## 2011-09-01 ENCOUNTER — Ambulatory Visit (INDEPENDENT_AMBULATORY_CARE_PROVIDER_SITE_OTHER): Payer: BC Managed Care – PPO | Admitting: Family

## 2011-09-01 VITALS — BP 130/90 | HR 68 | Temp 98.0°F | Resp 16 | Ht 64.0 in | Wt 199.0 lb

## 2011-09-01 DIAGNOSIS — I10 Essential (primary) hypertension: Secondary | ICD-10-CM

## 2011-09-01 DIAGNOSIS — M7660 Achilles tendinitis, unspecified leg: Secondary | ICD-10-CM

## 2011-09-01 DIAGNOSIS — E119 Type 2 diabetes mellitus without complications: Secondary | ICD-10-CM

## 2011-09-01 DIAGNOSIS — M7662 Achilles tendinitis, left leg: Secondary | ICD-10-CM | POA: Insufficient documentation

## 2011-09-01 MED ORDER — SITAGLIP PHOS-METFORMIN HCL ER 100-1000 MG PO TB24: 1.0000 | ORAL_TABLET | Freq: Every day | ORAL | Status: DC

## 2011-09-01 NOTE — Assessment & Plan Note (Signed)
Pt with tenderness of the left achilles tendon.  Tendon appears intact on exam.  Will refer to Dr. Pearletha Forge for further evaluation.

## 2011-09-01 NOTE — Telephone Encounter (Signed)
Notified pt that we are out of samples of Januvia. Pt states he is currently taking Zegerid OTC instead of Prilosec and will continue that.

## 2011-09-01 NOTE — Assessment & Plan Note (Signed)
Has had some elevated sugars at home.  Will obtain A1C. Change januvia/metformin to janumet. 7 tabs provided today as samples and coupon card.

## 2011-09-01 NOTE — Telephone Encounter (Signed)
He is actually on the OTC dosing of prilosec.  I would recommend otc prilosec instead of nexium.

## 2011-09-01 NOTE — Telephone Encounter (Signed)
Prilosec is on pt's med list. Is it ok to provide Nexium samples in place of that?

## 2011-09-01 NOTE — Assessment & Plan Note (Signed)
BP Readings from Last 3 Encounters:  09/01/11 130/90  07/30/11 100/60  07/02/11 140/90  BP stable. Continue current meds.

## 2011-09-01 NOTE — Patient Instructions (Signed)
You will be contact about your referral to sports medicine. Please let us know if you have not heard back within 1 week about your referral. Complete your blood work prior to leaving.  Follow up in 3 months.

## 2011-09-01 NOTE — Progress Notes (Signed)
Subjective:    Patient ID: Keith Hardin, male    DOB: 08/17/1950, 61 y.o.   MRN: [id number]  HPI Mr.  [name] is a 61 yr old male who presents today with chief complaint of pain in the left achilles tendon. Reports tenderness for a few weeks.  No known injury.  Hx of right achilles tear and repair.  Sugars have been running in the 200's in the early morning.  On metformin and januvia.  Has been walking and this has been helping his sugars some.  HTN- denies sob/chest pain. Tolerating medications without difficulty.  Review of Systems    see HPI  Past Medical History  Diagnosis Date  . Allergy   . Diabetes mellitus     type II  . Hyperlipidemia   . Colon polyps   . Orchitis and epididymitis 01/2008    left with left hydrocele  . Hypogonadism male 05/02/2011    History   Social History  . Marital Status: Married    Spouse Name: Cicero Duck    Number of Children: 0  . Years of Education: N/A   Occupational History  . Unemployed    Social History Main Topics  . Smoking status: Never Smoker   . Smokeless tobacco: Never Used  . Alcohol Use: No  . Drug Use: No  . Sexually Active: Not on file   Other Topics Concern  . Not on file   Social History Narrative   Occupation: unemployedMarried since 1999No children     No past surgical history on file.  Family History  Problem Relation Age of Onset  . Seizures Mother   . Cancer Brother     stomach  . Colon cancer Neg Hx     No Known Allergies  Current Outpatient Prescriptions on File Prior to Visit  Medication Sig Dispense Refill  . albuterol (PROAIR HFA) 108 (90 BASE) MCG/ACT inhaler 2 puffs every 4-6 hours as needed       . aspirin 81 MG tablet Take 81 mg by mouth daily.        . cetirizine (ZYRTEC) 10 MG tablet Take 10 mg by mouth daily.        . cloNIDine (CATAPRES) 0.3 MG tablet Take 1 tablet (0.3 mg total) by mouth 2 (two) times daily.  60 tablet  5  . diltiazem (CARTIA XT) 240 MG 24 hr capsule Take 1 capsule  (240 mg total) by mouth daily.  30 capsule  5  . hydrochlorothiazide (HYDRODIURIL) 25 MG tablet TAKE 1 TABLET BY MOUTH DAILY  30 tablet  2  . labetalol (NORMODYNE) 300 MG tablet TAKE ONE TABLET BY MOUTH TWICE DAILY  60 tablet  2  . lisinopril (PRINIVIL,ZESTRIL) 40 MG tablet Take 1 tablet (40 mg total) by mouth daily.  30 tablet  3  . Omeprazole-Sodium Bicarbonate (ZEGERID) 20-1100 MG CAPS Take 1 capsule by mouth daily before breakfast.      . simvastatin (ZOCOR) 40 MG tablet Take 1 tablet (40 mg total) by mouth at bedtime.  90 tablet  1  . glucose blood (ACCU-CHEK AVIVA) test strip Use as instructed three times daily to check blood sugar.  100 each  3  . Lancets (ACCU-CHEK MULTICLIX) lancets Use as instructed three times a day to check blood sugar. DX 250.00  102 each  3  . omeprazole (PRILOSEC) 20 MG capsule Take 20 mg by mouth daily.        . SitaGLIPtin-MetFORMIN HCl (JANUMET XR) [phone] MG TB24 Take  1 tablet by mouth daily.  30 tablet  5    BP 130/90  Pulse 68  Temp(Src) 98 F (36.7 C) (Oral)  Resp 16  Ht 5\' 4"  (1.626 m)  Wt 199 lb (90.266 kg)  BMI 34.16 kg/m2  SpO2 98%    Objective:   Physical Exam  Constitutional: He is oriented to person, place, and time. He appears well-developed and well-nourished. No distress.  Cardiovascular: Normal rate and regular rhythm.   No murmur heard. Pulmonary/Chest: Effort normal and breath sounds normal. No respiratory distress. He has no wheezes. He has no rales. He exhibits no tenderness.  Musculoskeletal:       Achilles tendon is mildly tender, but intact.  No erythema/swelling.   Neurological: He is alert and oriented to person, place, and time.  Psychiatric: He has a normal mood and affect. His behavior is normal. Judgment and thought content normal.          Assessment & Plan:

## 2011-09-02 ENCOUNTER — Ambulatory Visit: Payer: BC Managed Care – PPO | Admitting: Family Medicine

## 2011-09-02 ENCOUNTER — Telehealth: Payer: Self-pay | Admitting: *Deleted

## 2011-09-02 NOTE — Telephone Encounter (Signed)
He can take the metformin er 500 mg 2 tabs before dinner in addition to the janumet in am.

## 2011-09-02 NOTE — Telephone Encounter (Signed)
Received call from pt stating he has janumet samples 100-1000mg  that he is taking once a day.  Pt wants to know if he should be on a higher dose as he was taking 1000 mg twice a day of metformin previously.  Please advise.

## 2011-09-02 NOTE — Telephone Encounter (Signed)
Notified pt. 

## 2011-09-03 ENCOUNTER — Telehealth: Payer: Self-pay | Admitting: Family

## 2011-09-03 NOTE — Telephone Encounter (Signed)
Spoke with pt re: increased A1C.  Could you pls add him to the schedule for insulin teaching 8:15 on Monday morning? Pt is aware. thanks

## 2011-09-03 NOTE — Telephone Encounter (Signed)
Appt scheduled for Monday at 8:15am.

## 2011-09-06 ENCOUNTER — Ambulatory Visit (INDEPENDENT_AMBULATORY_CARE_PROVIDER_SITE_OTHER): Payer: BC Managed Care – PPO | Admitting: Family Medicine

## 2011-09-06 ENCOUNTER — Encounter: Payer: Self-pay | Admitting: Family

## 2011-09-06 ENCOUNTER — Ambulatory Visit (INDEPENDENT_AMBULATORY_CARE_PROVIDER_SITE_OTHER): Payer: BC Managed Care – PPO | Admitting: Family

## 2011-09-06 ENCOUNTER — Ambulatory Visit: Payer: BC Managed Care – PPO | Admitting: Family Medicine

## 2011-09-06 ENCOUNTER — Encounter: Payer: Self-pay | Admitting: Family Medicine

## 2011-09-06 VITALS — BP 144/88 | HR 48 | Temp 97.7°F | Ht 64.0 in | Wt 196.0 lb

## 2011-09-06 VITALS — HR 48 | Temp 97.5°F | Resp 18 | Ht 64.0 in | Wt 196.0 lb

## 2011-09-06 DIAGNOSIS — E119 Type 2 diabetes mellitus without complications: Secondary | ICD-10-CM

## 2011-09-06 DIAGNOSIS — M7662 Achilles tendinitis, left leg: Secondary | ICD-10-CM

## 2011-09-06 DIAGNOSIS — M7660 Achilles tendinitis, unspecified leg: Secondary | ICD-10-CM

## 2011-09-06 NOTE — Assessment & Plan Note (Signed)
Deteriorated despite compliance with meds.  Wife expressed great concern today over patient's need for insulin and requests referral to nutritionist prior to moving towards insulin.  Will refer to nutritionist.  We discussed that if his sugars remain elevated despite stricter dietary changes and exercise at his 3 month follow up, then we will plan to start insulin at that time.  Patient and wife verbalize understanding.  15 minutes spent with pt today. >50% of that time was spent counseling pt on his diabetes.

## 2011-09-06 NOTE — Progress Notes (Signed)
  Subjective:    Patient ID: Keith Hardin, male    DOB: [birth year], 61 y.o.   MRN: [id number]  HPI  Keith Hardin is a 61 yr old male who presents today with his wife to discuss his diabetes.  Last visit his A1C was noted to rise from 7.2 to 9.1.  He denies any changes in med compliance, diet or exercise.      Review of Systems     Objective:   Physical Exam  Constitutional: He appears well-developed and well-nourished. No distress.  Psychiatric: He has a normal mood and affect. His behavior is normal. Judgment and thought content normal.          Assessment & Plan:

## 2011-09-06 NOTE — Assessment & Plan Note (Signed)
Shown home rehab protocol, handouts provided.  Icing, tylenol/aleve as needed, heel lifts in regular shoes.  Consider orthotics, formal PT, nitro patches if not improving as expected.

## 2011-09-06 NOTE — Patient Instructions (Signed)
You have acute achilles tendinopathy Tylenol or aleve as needed for pain Lowering/raise on a step exercises 3 x 15 once or twice a day - two feet first then one (start with 3 x 6) Can add heel walks, toe walks forward and backward as well Ice bucket 10-15 minutes at end of day - can ice 3-4 times a day. Avoid uneven ground, hills as much as possible. Start with heel lifts in supportive shoes If getting worse instead of better, call me and we can start formal physical therapy. If that's the case you may also pick up Dr. Jari Sportsman Active sport inserts (plastic arch support, full length orthotics - they run about 15-18 dollars at a drug store). Nitro patches are another consideration if you're not improving as expected. Follow up with me in 4-6 weeks for reevaluation.

## 2011-09-06 NOTE — Patient Instructions (Addendum)
You will be contact about your referral to nutrition. Please let us know if you have not heard back within 1 week about your referral. Please schedule a follow up appointment in 3 months.

## 2011-09-06 NOTE — Progress Notes (Signed)
Subjective:    Patient ID: Keith Hardin, male    DOB: March 15, 1951, 61 y.o.   MRN: [id number]  PCP: Sandford Craze  HPI 61 yo M here for left heel pain.  Patient denies known injury. No increase in recent activity either. Over past 2 weeks pain in posterior left achilles has worsened similar to when he had problems on right side. Has a knot within achilles on this side. Hasn't tried icing, medicines, or rehab. Drives trucks for a living. No other complaints.  Past Medical History  Diagnosis Date  . Allergy   . Diabetes mellitus     type II  . Hyperlipidemia   . Colon polyps   . Orchitis and epididymitis 01/2008    left with left hydrocele  . Hypogonadism male 05/02/2011  . Hypertension     Current Outpatient Prescriptions on File Prior to Visit  Medication Sig Dispense Refill  . albuterol (PROAIR HFA) 108 (90 BASE) MCG/ACT inhaler 2 puffs every 4-6 hours as needed       . aspirin 81 MG tablet Take 81 mg by mouth daily.        . cetirizine (ZYRTEC) 10 MG tablet Take 10 mg by mouth daily.        . cloNIDine (CATAPRES) 0.3 MG tablet Take 1 tablet (0.3 mg total) by mouth 2 (two) times daily.  60 tablet  5  . diltiazem (CARTIA XT) 240 MG 24 hr capsule Take 1 capsule (240 mg total) by mouth daily.  30 capsule  5  . glucose blood (ACCU-CHEK AVIVA) test strip Use as instructed three times daily to check blood sugar.  100 each  3  . hydrochlorothiazide (HYDRODIURIL) 25 MG tablet TAKE 1 TABLET BY MOUTH DAILY  30 tablet  2  . labetalol (NORMODYNE) 300 MG tablet TAKE ONE TABLET BY MOUTH TWICE DAILY  60 tablet  2  . Lancets (ACCU-CHEK MULTICLIX) lancets Use as instructed three times a day to check blood sugar. DX 250.00  102 each  3  . lisinopril (PRINIVIL,ZESTRIL) 40 MG tablet Take 1 tablet (40 mg total) by mouth daily.  30 tablet  3  . metFORMIN (GLUCOPHAGE-XR) 500 MG 24 hr tablet Take 1,000 mg by mouth daily with supper.      Maxwell Caul Bicarbonate (ZEGERID) 20-1100 MG  CAPS Take 1 capsule by mouth daily before breakfast.      . simvastatin (ZOCOR) 40 MG tablet Take 1 tablet (40 mg total) by mouth at bedtime.  90 tablet  1  . SitaGLIPtin-MetFORMIN HCl (JANUMET XR) [phone] MG TB24 Take 1 tablet by mouth daily.  30 tablet  5    Past Surgical History  Procedure Date  . Achilles tendon repair     right    No Known Allergies  History   Social History  . Marital Status: Married    Spouse Name: Cicero Duck    Number of Children: 0  . Years of Education: N/A   Occupational History  . Unemployed    Social History Main Topics  . Smoking status: Never Smoker   . Smokeless tobacco: Never Used  . Alcohol Use: No  . Drug Use: No  . Sexually Active: Not on file   Other Topics Concern  . Not on file   Social History Narrative   Occupation: unemployedMarried since 1999No children     Family History  Problem Relation Age of Onset  . Seizures Mother   . Hypertension Mother   . Cancer Brother  stomach  . Colon cancer Neg Hx   . Diabetes Neg Hx   . Heart attack Neg Hx   . Hyperlipidemia Neg Hx   . Sudden death Neg Hx     BP 144/88  Pulse 48  Temp(Src) 97.7 F (36.5 C) (Oral)  Ht 5\' 4"  (1.626 m)  Wt 196 lb (88.905 kg)  BMI 33.64 kg/m2  Review of Systems See HPI above.    Objective:   Physical Exam Gen: NAD  Left foot/ankle: Nodule visible and palpated 2 cm prox to achilles insertion on calcaneus.  No other deformity, swelling, ecchymoses FROM with 5/5 strength all directions. TTP in area of nodule noted above.  No other foot/ankle TTP. Negative ant drawer and talar tilt.   Negative syndesmotic compression. Thompsons test negative. NV intact distally.  MSK u/s: AT left ankle measures 0.81 cm maximal depth, 0.53 near insertion.  No increased neovascularity.    Assessment & Plan:  1. Left achilles tendinopathy - Shown home rehab protocol, handouts provided.  Icing, tylenol/aleve as needed, heel lifts in regular shoes.  Consider  orthotics, formal PT, nitro patches if not improving as expected.

## 2011-10-04 ENCOUNTER — Other Ambulatory Visit: Payer: Self-pay | Admitting: Family

## 2011-10-05 MED ORDER — METFORMIN HCL ER 500 MG PO TB24: 1000.0000 mg | ORAL_TABLET | Freq: Every day | ORAL | Status: DC

## 2011-10-05 NOTE — Telephone Encounter (Signed)
Received request for metformin 500mg  2 tabs twice a day. Denied request as directions have changed to 2 tabs once a day. Refill sent with current directions #180 x no refills.

## 2011-10-07 ENCOUNTER — Encounter: Payer: Self-pay | Admitting: *Deleted

## 2011-10-07 ENCOUNTER — Encounter: Payer: BC Managed Care – PPO | Attending: Family | Admitting: *Deleted

## 2011-10-07 VITALS — Ht 64.0 in | Wt 199.0 lb

## 2011-10-07 DIAGNOSIS — E119 Type 2 diabetes mellitus without complications: Secondary | ICD-10-CM | POA: Insufficient documentation

## 2011-10-07 DIAGNOSIS — Z713 Dietary counseling and surveillance: Secondary | ICD-10-CM | POA: Insufficient documentation

## 2011-10-07 NOTE — Progress Notes (Signed)
  Medical Nutrition Therapy:  Appt start time: 1530 end time:  1630.  Assessment:  Primary concerns today: patient is here with his wife who appears very supportive to learn more about diabetes and food choices. He eats very healthy already and is physically active with his work and yard work he does at home. A1c has jumped from 7.2% to 9.4% in past 3 months so MD has suggested he start insulin. He has been under a lot of emotional stress the past 6 months and would like an opportunity to reduce his BG through activity and reducing his carb intake before starting on insulin.   MEDICATIONS: see list   DIETARY INTAKE:  Usual eating pattern includes 3 meals and 0-1 snacks per day.  Everyday foods include good variety of all food groups.  Avoided foods include fried and sweet foods, pasta and rice.    24-hr recall:  B ( AM): eggwhites, cheese, SF wheat bread x 2 with mustard, occasionally coffee with Splenda, creamer Snk ( AM): crackers OR almonds  L ( PM): sandwich with [place], mustard and cheese, fresh fruit, or chips, cranberry juice, or water or diet soda Snk ( PM): none D ( PM): lean meat, vegetable, occasionally a potato Snk ( PM): none Beverages: coffee, water, cranberry juice, diet soda  Usual physical activity: active at work, and Presenter, broadcasting  Estimated energy needs: 1500 calories 170 g carbohydrates 112 g protein 42 g fat  Progress Towards Goal(s):  In progress.   Nutritional Diagnosis:  NB-1.1 Food and nutrition-related knowledge deficit As related to diabetes management.  As evidenced by A1c of 9.4% and no previous diabetes education.    Intervention:  Nutrition counseling and diabetes education provided. Discussed basic physiology of diabetes, rationale of SMBG more often, Carb Counting and reading food labels. Also discussed insulin action and rationale of using insulin especially when eating habits and activity level are already appropriate. Plan: Continue to aim for 3-4  Carbs / meal (45 - 60 grams) Aim for 0-2 Carbs / snack Read food labels for total Carbohydrate of foods Aim for 30 minutes of activity / as tolerated Consider checking your BG twice a day before and after meals for one week to provide MD with more info  Handouts given during visit include: Living Well with Diabetes Carb Counting and Food Label handouts Meal Plan Card Medication handout  BG Log Book  Monitoring/Evaluation:  Dietary intake, exercise, SMBG, and body weight prn.

## 2011-10-07 NOTE — Patient Instructions (Addendum)
Plan: Continue to aim for 3-4 Carbs / meal (45 - 60 grams) Aim for 0-2 Carbs / snack Read food labels for total Carbohydrate of foods Aim for 30 minutes of activity / as tolerated Consider checking your BG twice a day before and after meals for one week to provide MD with more info

## 2011-10-11 ENCOUNTER — Other Ambulatory Visit: Payer: Self-pay | Admitting: Family

## 2011-10-25 ENCOUNTER — Other Ambulatory Visit: Payer: Self-pay | Admitting: Family

## 2011-11-05 ENCOUNTER — Other Ambulatory Visit: Payer: Self-pay | Admitting: *Deleted

## 2011-11-05 ENCOUNTER — Other Ambulatory Visit: Payer: Self-pay | Admitting: Family

## 2011-11-05 MED ORDER — DILTIAZEM HCL ER COATED BEADS 240 MG PO CP24: 240.0000 mg | ORAL_CAPSULE | Freq: Every day | ORAL | Status: DC

## 2011-11-05 MED ORDER — CLONIDINE HCL 0.3 MG PO TABS: 0.3000 mg | ORAL_TABLET | Freq: Two times a day (BID) | ORAL | Status: DC

## 2011-11-05 NOTE — Telephone Encounter (Signed)
Refill sent to [facility] for cartia and clonidine

## 2011-11-08 ENCOUNTER — Telehealth: Payer: Self-pay | Admitting: Family

## 2011-11-08 MED ORDER — LISINOPRIL 40 MG PO TABS: 40.0000 mg | ORAL_TABLET | Freq: Every day | ORAL | Status: DC

## 2011-11-08 NOTE — Telephone Encounter (Signed)
Patient is requesting a refill of lisinopril to be sent to [facility] [address]

## 2011-11-08 NOTE — Telephone Encounter (Signed)
Refill sent to pharmacy #30 x 1 refill. 

## 2011-12-03 ENCOUNTER — Ambulatory Visit: Payer: BC Managed Care – PPO | Admitting: Family

## 2011-12-08 ENCOUNTER — Other Ambulatory Visit: Payer: Self-pay | Admitting: Family

## 2011-12-10 ENCOUNTER — Encounter: Payer: Self-pay | Admitting: Family

## 2011-12-10 ENCOUNTER — Ambulatory Visit (INDEPENDENT_AMBULATORY_CARE_PROVIDER_SITE_OTHER): Payer: BC Managed Care – PPO | Admitting: Family

## 2011-12-10 VITALS — BP 142/82 | HR 58 | Temp 98.6°F | Resp 16 | Ht 64.0 in | Wt 197.1 lb

## 2011-12-10 DIAGNOSIS — R05 Cough: Secondary | ICD-10-CM

## 2011-12-10 DIAGNOSIS — I10 Essential (primary) hypertension: Secondary | ICD-10-CM

## 2011-12-10 DIAGNOSIS — E785 Hyperlipidemia, unspecified: Secondary | ICD-10-CM

## 2011-12-10 DIAGNOSIS — E119 Type 2 diabetes mellitus without complications: Secondary | ICD-10-CM

## 2011-12-10 DIAGNOSIS — R7989 Other specified abnormal findings of blood chemistry: Secondary | ICD-10-CM

## 2011-12-10 DIAGNOSIS — E291 Testicular hypofunction: Secondary | ICD-10-CM

## 2011-12-10 MED ORDER — OMEPRAZOLE-SODIUM BICARBONATE 20-1100 MG PO CAPS: 1.0000 | ORAL_CAPSULE | Freq: Every day | ORAL | Status: DC

## 2011-12-10 MED ORDER — CLONIDINE HCL 0.3 MG PO TABS: 0.3000 mg | ORAL_TABLET | Freq: Two times a day (BID) | ORAL | Status: DC

## 2011-12-10 MED ORDER — METFORMIN HCL ER 500 MG PO TB24: 1000.0000 mg | ORAL_TABLET | Freq: Every day | ORAL | Status: DC

## 2011-12-10 MED ORDER — LABETALOL HCL 300 MG PO TABS: 300.0000 mg | ORAL_TABLET | Freq: Two times a day (BID) | ORAL | Status: DC

## 2011-12-10 NOTE — Progress Notes (Signed)
Subjective:    Patient ID: Keith Hardin, male    DOB: 07-20-1950, 61 y.o.   MRN: [id number]  HPI  Keith Hardin is a 61 yr old male who presents today for follow up.   DM2- Was referred to nutrition.  Currently maintained on janumet.  He has trying not to eat late. No problems with meds.  Reports the AM sugar was 121.  Post prandial his sugar is about 135.    HTN- He ismaintained on clonidine, diltiazem, cartia xt.  Denies associated CP/edem.   Hypogonadism- was referred to Urology back in January.  Cough-reports clear nasals discharge.  Felt sluggish 2 weeks ago, energy has improved.   Review of Systems    see HPI  Past Medical History  Diagnosis Date  . Allergy   . Diabetes mellitus     type II  . Hyperlipidemia   . Colon polyps   . Orchitis and epididymitis 01/2008    left with left hydrocele  . Hypogonadism male 05/02/2011  . Hypertension     History   Social History  . Marital Status: Married    Spouse Name: Cicero Duck    Number of Children: 0  . Years of Education: N/A   Occupational History  . Unemployed    Social History Main Topics  . Smoking status: Never Smoker   . Smokeless tobacco: Never Used  . Alcohol Use: No  . Drug Use: No  . Sexually Active: Not on file   Other Topics Concern  . Not on file   Social History Narrative   Occupation: unemployedMarried since 1999No children     Past Surgical History  Procedure Date  . Achilles tendon repair     right    Family History  Problem Relation Age of Onset  . Seizures Mother   . Hypertension Mother   . Cancer Brother     stomach  . Colon cancer Neg Hx   . Diabetes Neg Hx   . Heart attack Neg Hx   . Hyperlipidemia Neg Hx   . Sudden death Neg Hx     No Known Allergies  Current Outpatient Prescriptions on File Prior to Visit  Medication Sig Dispense Refill  . albuterol (PROAIR HFA) 108 (90 BASE) MCG/ACT inhaler 2 puffs every 4-6 hours as needed       . aspirin 81 MG tablet Take 81 mg by  mouth daily.        . cetirizine (ZYRTEC) 10 MG tablet Take 10 mg by mouth daily.        Marland Kitchen diltiazem (CARTIA XT) 240 MG 24 hr capsule Take 1 capsule (240 mg total) by mouth daily.  30 capsule  4  . glucose blood (ACCU-CHEK AVIVA) test strip Use as instructed three times daily to check blood sugar.  100 each  3  . hydrochlorothiazide (HYDRODIURIL) 25 MG tablet TAKE 1 TABLET BY MOUTH DAILY  30 tablet  2  . Lancets (ACCU-CHEK MULTICLIX) lancets Use as instructed three times a day to check blood sugar. DX 250.00  102 each  3  . lisinopril (PRINIVIL,ZESTRIL) 40 MG tablet Take 1 tablet (40 mg total) by mouth daily.  30 tablet  1  . simvastatin (ZOCOR) 40 MG tablet Take 1 tablet (40 mg total) by mouth at bedtime.  90 tablet  1  . SitaGLIPtin-MetFORMIN HCl (JANUMET XR) [phone] MG TB24 Take 1 tablet by mouth daily.  30 tablet  5  . DISCONTD: cloNIDine (CATAPRES) 0.3 MG tablet TAKE  1 TABLET BY MOUTH TWICE DAILY  60 tablet  0  . DISCONTD: labetalol (NORMODYNE) 300 MG tablet TAKE ONE TABLET BY MOUTH TWICE DAILY  60 tablet  1  . DISCONTD: Omeprazole-Sodium Bicarbonate (ZEGERID) 20-1100 MG CAPS Take 1 capsule by mouth daily before breakfast.        BP 142/82  Pulse 58  Temp 98.6 F (37 C) (Oral)  Resp 16  Ht 5\' 4"  (1.626 m)  Wt 197 lb 1.9 oz (89.413 kg)  BMI 33.84 kg/m2  SpO2 99%    Objective:   Physical Exam  Constitutional: He appears well-developed and well-nourished. No distress.  Cardiovascular: Normal rate and regular rhythm.   No murmur heard. Pulmonary/Chest: Effort normal and breath sounds normal. No respiratory distress. He has no wheezes. He has no rales. He exhibits no tenderness.  Musculoskeletal: He exhibits no edema.  Psychiatric: He has a normal mood and affect. His behavior is normal. Judgment and thought content normal.          Assessment & Plan:

## 2011-12-10 NOTE — Assessment & Plan Note (Signed)
Per pt report, this is improving.  Obtain A1C and urine microalbumin.  Will refer to Endocrinology.

## 2011-12-10 NOTE — Assessment & Plan Note (Signed)
·   Obtain FLP

## 2011-12-10 NOTE — Assessment & Plan Note (Signed)
Follow up bp in office today was better. Continue current meds. Follow up in 3 months.

## 2011-12-10 NOTE — Assessment & Plan Note (Signed)
Did not keep urology apt as he was asked to bring money up front.  Will refer to Endo for management of his low T and diabetes.

## 2011-12-10 NOTE — Assessment & Plan Note (Signed)
Likely due to recent URI- dry and seems to be improving.  Advised pt that it may be several weeks before cough completely resolves. Lung exam is normal today.

## 2011-12-10 NOTE — Patient Instructions (Addendum)
Please complete your blood work prior to leaving.  You will be contact about your referral to endocrinology for your diabetes and low testosterone. Please let us know if you have not heard back within 1 week about your referral. Follow up in 3 months.

## 2011-12-11 LAB — LIPID PANEL
Cholesterol: 157 mg/dL (ref 0–200)
Triglycerides: 120 mg/dL (ref <150)
VLDL: 24 mg/dL (ref 0–40)

## 2011-12-11 LAB — HEPATIC FUNCTION PANEL
ALT: 18 U/L (ref 0–53)
AST: 19 U/L (ref 0–37)
Indirect Bilirubin: 0.3 mg/dL (ref 0.0–0.9)
Total Protein: 6.8 g/dL (ref 6.0–8.3)

## 2011-12-11 LAB — HEMOGLOBIN A1C: Mean Plasma Glucose: 160 mg/dL — ABNORMAL HIGH (ref <117)

## 2011-12-11 LAB — MICROALBUMIN / CREATININE URINE RATIO: Creatinine, Urine: 99.1 mg/dL

## 2011-12-13 ENCOUNTER — Telehealth: Payer: Self-pay | Admitting: Family

## 2011-12-14 ENCOUNTER — Telehealth: Payer: Self-pay | Admitting: Family

## 2011-12-14 MED ORDER — SIMVASTATIN 40 MG PO TABS: 40.0000 mg | ORAL_TABLET | Freq: Every day | ORAL | Status: DC

## 2011-12-14 NOTE — Telephone Encounter (Signed)
Opened in error

## 2011-12-14 NOTE — Telephone Encounter (Signed)
Pls call pt and let him know that his cholesterol looks good.  He can continue simvastatin at current dose.  Refill has been sent to his pharmacy.  Diabetes is improving.  A1C was 9.4 now down to 7.2.  Goal is <7.0.  Keep working hard on diet, exercise.

## 2011-12-25 ENCOUNTER — Other Ambulatory Visit: Payer: Self-pay | Admitting: Family

## 2011-12-28 NOTE — Telephone Encounter (Signed)
Labetalol request denied with note for pharmacy to check refills on file from 12/10/11 #60 x 5 refills.

## 2012-01-08 ENCOUNTER — Other Ambulatory Visit: Payer: Self-pay | Admitting: Family

## 2012-01-10 NOTE — Telephone Encounter (Signed)
Rx[s] Done/SLS 

## 2012-01-25 ENCOUNTER — Other Ambulatory Visit: Payer: Self-pay | Admitting: Family

## 2012-03-10 ENCOUNTER — Encounter: Payer: Self-pay | Admitting: Family

## 2012-03-10 ENCOUNTER — Ambulatory Visit (INDEPENDENT_AMBULATORY_CARE_PROVIDER_SITE_OTHER): Payer: BC Managed Care – PPO | Admitting: Family

## 2012-03-10 VITALS — BP 134/84 | HR 56 | Temp 98.0°F | Resp 18 | Wt 193.1 lb

## 2012-03-10 DIAGNOSIS — E119 Type 2 diabetes mellitus without complications: Secondary | ICD-10-CM

## 2012-03-10 DIAGNOSIS — E291 Testicular hypofunction: Secondary | ICD-10-CM

## 2012-03-10 DIAGNOSIS — I10 Essential (primary) hypertension: Secondary | ICD-10-CM

## 2012-03-10 DIAGNOSIS — E785 Hyperlipidemia, unspecified: Secondary | ICD-10-CM

## 2012-03-10 LAB — BASIC METABOLIC PANEL
CO2: 32 mEq/L (ref 19–32)
Calcium: 9.8 mg/dL (ref 8.4–10.5)
Chloride: 101 mEq/L (ref 96–112)
Creat: 0.96 mg/dL (ref 0.50–1.35)
Sodium: 142 mEq/L (ref 135–145)

## 2012-03-10 MED ORDER — LABETALOL HCL 300 MG PO TABS: 300.0000 mg | ORAL_TABLET | Freq: Two times a day (BID) | ORAL | Status: DC

## 2012-03-10 MED ORDER — OMEPRAZOLE-SODIUM BICARBONATE 20-1100 MG PO CAPS: 1.0000 | ORAL_CAPSULE | Freq: Every day | ORAL | Status: DC

## 2012-03-10 MED ORDER — DILTIAZEM HCL ER COATED BEADS 240 MG PO CP24: 240.0000 mg | ORAL_CAPSULE | Freq: Every day | ORAL | Status: DC

## 2012-03-10 MED ORDER — LISINOPRIL 40 MG PO TABS: 40.0000 mg | ORAL_TABLET | Freq: Every day | ORAL | Status: DC

## 2012-03-10 MED ORDER — SITAGLIP PHOS-METFORMIN HCL ER 100-1000 MG PO TB24: 1.0000 | ORAL_TABLET | Freq: Every day | ORAL | Status: DC

## 2012-03-10 MED ORDER — METFORMIN HCL ER 500 MG PO TB24: 1000.0000 mg | ORAL_TABLET | Freq: Every day | ORAL | Status: DC

## 2012-03-10 MED ORDER — PRAVASTATIN SODIUM 80 MG PO TABS: 80.0000 mg | ORAL_TABLET | Freq: Every day | ORAL | Status: DC

## 2012-03-10 MED ORDER — HYDROCHLOROTHIAZIDE 25 MG PO TABS: 25.0000 mg | ORAL_TABLET | Freq: Every day | ORAL | Status: DC

## 2012-03-10 MED ORDER — CLONIDINE HCL 0.3 MG PO TABS: 0.3000 mg | ORAL_TABLET | Freq: Two times a day (BID) | ORAL | Status: DC

## 2012-03-10 NOTE — Progress Notes (Signed)
Subjective:    Patient ID: Keith Hardin, male    DOB: Jun 13, 1950, 61 y.o.   MRN: [id number]  HPI  Pt presents today for follow up.  1) Diabetes- reports that he has been checking his AM sugars and they are generally less than 120.  Denies polyruria.  2) HTN- tolerating medications without difficulty.  3) Low testosterone-  He reports low libido.    4) Hyperlipidemia-tolerating statin without myalgia.    Review of Systems See HPI  Past Medical History  Diagnosis Date  . Allergy   . Diabetes mellitus     type II  . Hyperlipidemia   . Colon polyps   . Orchitis and epididymitis 01/2008    left with left hydrocele  . Hypogonadism male 05/02/2011  . Hypertension     History   Social History  . Marital Status: Married    Spouse Name: Cicero Duck    Number of Children: 0  . Years of Education: N/A   Occupational History  . Unemployed    Social History Main Topics  . Smoking status: Never Smoker   . Smokeless tobacco: Never Used  . Alcohol Use: No  . Drug Use: No  . Sexually Active: Not on file   Other Topics Concern  . Not on file   Social History Narrative   Occupation: unemployedMarried since 1999No children     Past Surgical History  Procedure Date  . Achilles tendon repair     right    Family History  Problem Relation Age of Onset  . Seizures Mother   . Hypertension Mother   . Cancer Brother     stomach  . Colon cancer Neg Hx   . Diabetes Neg Hx   . Heart attack Neg Hx   . Hyperlipidemia Neg Hx   . Sudden death Neg Hx     No Known Allergies  Current Outpatient Prescriptions on File Prior to Visit  Medication Sig Dispense Refill  . albuterol (PROAIR HFA) 108 (90 BASE) MCG/ACT inhaler 2 puffs every 4-6 hours as needed       . aspirin 81 MG tablet Take 81 mg by mouth daily.        . cetirizine (ZYRTEC) 10 MG tablet Take 10 mg by mouth daily.        . cloNIDine (CATAPRES) 0.3 MG tablet Take 1 tablet (0.3 mg total) by mouth 2 (two) times daily.   60 tablet  5  . diltiazem (CARTIA XT) 240 MG 24 hr capsule Take 1 capsule (240 mg total) by mouth daily.  30 capsule  5  . glucose blood (ACCU-CHEK AVIVA) test strip Use as instructed three times daily to check blood sugar.  100 each  3  . hydrochlorothiazide (HYDRODIURIL) 25 MG tablet Take 1 tablet (25 mg total) by mouth daily.  30 tablet  5  . labetalol (NORMODYNE) 300 MG tablet Take 1 tablet (300 mg total) by mouth 2 (two) times daily.  60 tablet  5  . Lancets (ACCU-CHEK MULTICLIX) lancets Use as instructed three times a day to check blood sugar. DX 250.00  102 each  3  . lisinopril (PRINIVIL,ZESTRIL) 40 MG tablet Take 1 tablet (40 mg total) by mouth daily.  30 tablet  5  . Omeprazole-Sodium Bicarbonate (ZEGERID) 20-1100 MG CAPS Take 1 capsule by mouth daily before breakfast.  30 each  5  . SitaGLIPtin-MetFORMIN HCl (JANUMET XR) [phone] MG TB24 Take 1 tablet by mouth daily.  30 tablet  5  .  pravastatin (PRAVACHOL) 80 MG tablet Take 1 tablet (80 mg total) by mouth daily.  30 tablet  5    BP 134/84  Pulse 56  Temp 98 F (36.7 C) (Oral)  Resp 18  Wt 193 lb 1.3 oz (87.581 kg)  SpO2 99%       Objective:   Physical Exam  Constitutional: He is oriented to person, place, and time. He appears well-developed and well-nourished. No distress.  HENT:  Head: Normocephalic and atraumatic.  Cardiovascular: Normal rate and regular rhythm.   No murmur heard. Pulmonary/Chest: Effort normal and breath sounds normal. No respiratory distress. He has no wheezes. He has no rales. He exhibits no tenderness.  Musculoskeletal: He exhibits no edema.  Neurological: He is alert and oriented to person, place, and time.  Skin: Skin is warm and dry.  Psychiatric: He has a normal mood and affect. His behavior is normal. Judgment and thought content normal.          Assessment & Plan:

## 2012-03-10 NOTE — Patient Instructions (Addendum)
Please complete your blood work prior to leaving.  Please schedule a follow up appointment in 3 months- come fasting to this appointment.

## 2012-03-11 LAB — HEMOGLOBIN A1C
Hgb A1c MFr Bld: 6.5 % — ABNORMAL HIGH (ref <5.7)
Mean Plasma Glucose: 140 mg/dL — ABNORMAL HIGH (ref <117)

## 2012-03-12 ENCOUNTER — Encounter: Payer: Self-pay | Admitting: Family

## 2012-03-13 ENCOUNTER — Encounter: Payer: Self-pay | Admitting: Family

## 2012-03-13 NOTE — Assessment & Plan Note (Signed)
BP Readings from Last 3 Encounters:  03/10/12 134/84  12/10/11 142/82  09/06/11 144/88   BP stable on current meds. Continue same.

## 2012-03-13 NOTE — Assessment & Plan Note (Signed)
LDL at goal last visit. Will change simvastatin to pravastatin due to potential drug interaction with cartia.

## 2012-03-13 NOTE — Assessment & Plan Note (Signed)
A1C at goal at 6.5.  Continue Janumet/metformin.

## 2012-03-13 NOTE — Assessment & Plan Note (Signed)
Will forward testosterone results to his endocrinologist for further evaluation and treatment.

## 2012-04-08 ENCOUNTER — Other Ambulatory Visit: Payer: Self-pay | Admitting: Family

## 2012-04-10 NOTE — Telephone Encounter (Signed)
Refill denied with note to pharmacy to check refill on file from 03/10/12, #30 x 5 refills.     Medication name:  Name from pharmacy:  CARTIA XT 240 MG 24 hr capsule  CARTIA (DILTIAZEM) 240MG  XT CAPS The source prescription has been discontinued. Sig: TAKE ONE CAPSULE BY MOUTH DAILY Dispense: 30 capsule Refills: 0 Start: 04/08/2012 Class: Normal Requested on: 04/08/2012 Originally ordered on: 10/22/2010 Last refill: 03/08/2012

## 2012-06-09 ENCOUNTER — Ambulatory Visit: Payer: BC Managed Care – PPO | Admitting: Family

## 2012-06-23 ENCOUNTER — Telehealth: Payer: Self-pay | Admitting: Family

## 2012-06-23 ENCOUNTER — Ambulatory Visit (INDEPENDENT_AMBULATORY_CARE_PROVIDER_SITE_OTHER): Payer: BC Managed Care – PPO | Admitting: Family

## 2012-06-23 ENCOUNTER — Encounter: Payer: Self-pay | Admitting: Family

## 2012-06-23 VITALS — BP 134/70 | HR 47 | Temp 97.7°F | Resp 16 | Ht 64.0 in | Wt 190.1 lb

## 2012-06-23 DIAGNOSIS — E119 Type 2 diabetes mellitus without complications: Secondary | ICD-10-CM

## 2012-06-23 DIAGNOSIS — E785 Hyperlipidemia, unspecified: Secondary | ICD-10-CM

## 2012-06-23 DIAGNOSIS — E291 Testicular hypofunction: Secondary | ICD-10-CM

## 2012-06-23 DIAGNOSIS — Z86010 Personal history of colonic polyps: Secondary | ICD-10-CM

## 2012-06-23 LAB — BASIC METABOLIC PANEL
CO2: 29 mEq/L (ref 19–32)
Chloride: 103 mEq/L (ref 96–112)
Creat: 1.02 mg/dL (ref 0.50–1.35)
Potassium: 3.7 mEq/L (ref 3.5–5.3)

## 2012-06-23 LAB — LIPID PANEL
Cholesterol: 162 mg/dL (ref 0–200)
Total CHOL/HDL Ratio: 3.8 Ratio
VLDL: 18 mg/dL (ref 0–40)

## 2012-06-23 LAB — HEPATIC FUNCTION PANEL
ALT: 19 U/L (ref 0–53)
AST: 22 U/L (ref 0–37)
Alkaline Phosphatase: 47 U/L (ref 39–117)
Bilirubin, Direct: 0.1 mg/dL (ref 0.0–0.3)
Indirect Bilirubin: 0.6 mg/dL (ref 0.0–0.9)
Total Protein: 6.8 g/dL (ref 6.0–8.3)

## 2012-06-23 NOTE — Assessment & Plan Note (Signed)
At goal, management per endo.

## 2012-06-23 NOTE — Telephone Encounter (Signed)
Please send a copy of his labs to Dr Yetta Barre his cardiologist

## 2012-06-23 NOTE — Progress Notes (Signed)
Subjective:    Patient ID: Keith Hardin, male    DOB: 1950-08-01, 62 y.o.   MRN: [id number]  HPI  Keith Hardin is a 62 yr old male who presents today for follow up.  1) DM2-  He is currently maintained on invokana, and janumet. He is followed by [facility]. A1C 1/24 was at goal at 6.9. Reports that his last eye exam was >1 yr ago. Reports that his last eye exam was 9/13 was told normal exam.    2) Hypertension- pt is on ACE, hctz, diltiazem. Denies CP, LE edema or SOB.  3) Hyperlipidemia- Currently on pravastatin.  Last LDL 12/10/11 was 41. Denies myalgia.   4) Hypogonadism- He is currently receiving testosterone injections through Endo.  Feels better since starting testosterone.   5) colon polyps- reports hx of colon polyps- last colo 2006 done at [facility]. Requests follow up for colo.     Review of Systems See HPI  Past Medical History  Diagnosis Date  . Allergy   . Diabetes mellitus     type II  . Hyperlipidemia   . Colon polyps   . Orchitis and epididymitis 01/2008    left with left hydrocele  . Hypogonadism male 05/02/2011  . Hypertension     History   Social History  . Marital Status: Married    Spouse Name: Cicero Duck    Number of Children: 0  . Years of Education: N/A   Occupational History  . Unemployed    Social History Main Topics  . Smoking status: Never Smoker   . Smokeless tobacco: Never Used  . Alcohol Use: No  . Drug Use: No  . Sexually Active: Not on file   Other Topics Concern  . Not on file   Social History Narrative   Occupation: unemployed   Married since 1999   No children     Past Surgical History  Procedure Laterality Date  . Achilles tendon repair      right    Family History  Problem Relation Age of Onset  . Seizures Mother   . Hypertension Mother   . Cancer Brother     stomach  . Colon cancer Neg Hx   . Diabetes Neg Hx   . Heart attack Neg Hx   . Hyperlipidemia Neg Hx   . Sudden death Neg Hx     No Known  Allergies  Current Outpatient Prescriptions on File Prior to Visit  Medication Sig Dispense Refill  . albuterol (PROAIR HFA) 108 (90 BASE) MCG/ACT inhaler 2 puffs every 4-6 hours as needed       . aspirin 81 MG tablet Take 81 mg by mouth daily.        . cetirizine (ZYRTEC) 10 MG tablet Take 10 mg by mouth daily.        . cloNIDine (CATAPRES) 0.3 MG tablet Take 1 tablet (0.3 mg total) by mouth 2 (two) times daily.  60 tablet  5  . diltiazem (CARTIA XT) 240 MG 24 hr capsule Take 1 capsule (240 mg total) by mouth daily.  30 capsule  5  . glucose blood (ACCU-CHEK AVIVA) test strip Use as instructed three times daily to check blood sugar.  100 each  3  . hydrochlorothiazide (HYDRODIURIL) 25 MG tablet Take 1 tablet (25 mg total) by mouth daily.  30 tablet  5  . INVOKANA 100 MG TABS Take 100 mg by mouth daily.      Marland Kitchen labetalol (NORMODYNE) 300 MG tablet  Take 1 tablet (300 mg total) by mouth 2 (two) times daily.  60 tablet  5  . Lancets (ACCU-CHEK MULTICLIX) lancets Use as instructed three times a day to check blood sugar. DX 250.00  102 each  3  . lisinopril (PRINIVIL,ZESTRIL) 40 MG tablet Take 1 tablet (40 mg total) by mouth daily.  30 tablet  5  . Omeprazole-Sodium Bicarbonate (ZEGERID) 20-1100 MG CAPS Take 1 capsule by mouth daily before breakfast.  30 each  5  . pravastatin (PRAVACHOL) 80 MG tablet Take 1 tablet (80 mg total) by mouth daily.  30 tablet  5   No current facility-administered medications on file prior to visit.    BP 134/70  Pulse 47  Temp(Src) 97.7 F (36.5 C) (Oral)  Resp 16  Ht 5\' 4"  (1.626 m)  Wt 190 lb 1.9 oz (86.238 kg)  BMI 32.62 kg/m2  SpO2 97%       Objective:   Physical Exam        Assessment & Plan:

## 2012-06-23 NOTE — Assessment & Plan Note (Signed)
BP stable on current meds.   

## 2012-06-23 NOTE — Telephone Encounter (Signed)
Awaiting results

## 2012-06-23 NOTE — Assessment & Plan Note (Signed)
Feels better since starting testosterone. Management per endo.

## 2012-06-23 NOTE — Assessment & Plan Note (Signed)
Tolerating statin.  Continue same, check flp and lft.

## 2012-06-23 NOTE — Assessment & Plan Note (Signed)
Refer back to GI for follow up colo.

## 2012-06-23 NOTE — Patient Instructions (Addendum)
Please complete your lab work prior to leaving. Please schedule a follow up appointment in 3 months.  

## 2012-06-25 ENCOUNTER — Encounter: Payer: Self-pay | Admitting: Family

## 2012-06-26 NOTE — Telephone Encounter (Signed)
Left detailed message for patient to return my call.  °

## 2012-06-26 NOTE — Telephone Encounter (Signed)
Please call pt to obtain contact information for Dr Yetta Barre.

## 2012-06-27 NOTE — Telephone Encounter (Signed)
Left message for patient to return my call.

## 2012-06-28 NOTE — Telephone Encounter (Signed)
Results sent.

## 2012-06-28 NOTE — Telephone Encounter (Signed)
Patient states that he wants results sent to Dr. March Rummage, endocrinologist  [facility]  [address]  [address]  [place], [place] [address]  Main: [phone]  Fax: [phone]

## 2012-07-04 ENCOUNTER — Telehealth: Payer: Self-pay | Admitting: Family

## 2012-07-04 NOTE — Telephone Encounter (Signed)
Patient Information:  Caller Name: Kareen  Phone: [phone]  Patient: Keith Hardin, Keith Hardin  Gender: Male  DOB: 08/15/1950  Age: 62 Years  PCP: Sandford Craze (Adults only)  Office Follow Up:  Does the office need to follow up with this patient?: No  Instructions For The Office: N/A  RN Note:  Cough has been present with cold symptoms > 3 weeks.  Coughing up green phlegm.  Per cough protocol, emergent symptoms denied; advised appt within 24 hours.  Appt scheduled with Ms. O'Sullivan 1600 07/05/12 per patient scheduling availability.  krs/can  Symptoms  Reason For Call & Symptoms: cough x 3 weeks  Reviewed Health History In EMR: Yes  Reviewed Medications In EMR: Yes  Reviewed Allergies In EMR: Yes  Reviewed Surgeries / Procedures: Yes  Date of Onset of Symptoms: 06/13/2012  Guideline(s) Used:  Cough  Disposition Per Guideline:   See Today or Tomorrow in Office  Reason For Disposition Reached:   Continuous (nonstop) coughing interferes with work or school and no improvement using cough treatment per Care Advice  Advice Given:  Reassurance  Coughing is the way that our lungs remove irritants and mucus. It helps protect our lungs from getting pneumonia.  You can get a dry hacking cough after a chest cold. Sometimes this type of cough can last 1-3 weeks, and be worse at night.  You can also get a cough after being exposed to irritating substances like smoke, strong perfumes, and dust.  Here is some care advice that should help.  Cough Medicines:  OTC Cough Syrups: The most common cough suppressant in OTC cough medications is dextromethorphan. Often the letters "DM" appear in the name.  Cough Medicines:  OTC Cough Syrups: The most common cough suppressant in OTC cough medications is dextromethorphan. Often the letters "DM" appear in the name.  Home Remedy - Hard Candy: Hard candy works just as well as medicine-flavored OTC cough drops. Diabetics should use sugar-free candy.  Home Remedy - Honey: This old home remedy has been shown to help decrease coughing at night. The adult dosage is 2 teaspoons (10 ml) at bedtime. Honey should not be given to infants under one year of age.  Appointment Scheduled:  07/05/2012 16:00:00 Appointment Scheduled Provider:  Sandford Craze (Adults only)

## 2012-07-05 ENCOUNTER — Ambulatory Visit (INDEPENDENT_AMBULATORY_CARE_PROVIDER_SITE_OTHER): Payer: BC Managed Care – PPO | Admitting: Family

## 2012-07-05 ENCOUNTER — Encounter: Payer: Self-pay | Admitting: Family

## 2012-07-05 VITALS — BP 136/96 | HR 59 | Temp 97.6°F | Resp 16 | Wt 193.1 lb

## 2012-07-05 DIAGNOSIS — R011 Cardiac murmur, unspecified: Secondary | ICD-10-CM

## 2012-07-05 DIAGNOSIS — J322 Chronic ethmoidal sinusitis: Secondary | ICD-10-CM

## 2012-07-05 DIAGNOSIS — H6120 Impacted cerumen, unspecified ear: Secondary | ICD-10-CM

## 2012-07-05 MED ORDER — BENZONATATE 100 MG PO CAPS: 100.0000 mg | ORAL_CAPSULE | Freq: Three times a day (TID) | ORAL | Status: DC | PRN

## 2012-07-05 MED ORDER — AMOXICILLIN-POT CLAVULANATE 875-125 MG PO TABS: 1.0000 | ORAL_TABLET | Freq: Two times a day (BID) | ORAL | Status: DC

## 2012-07-05 NOTE — Assessment & Plan Note (Signed)
Cerumen plugs removed bilateral with currette.  Ears were then irrigated by CMA to remove residual cerumen.

## 2012-07-05 NOTE — Progress Notes (Signed)
Subjective:    Patient ID: Keith Hardin, male    DOB: October 26, 1950, 62 y.o.   MRN: [id number]  HPI  Keith Hardin is a 62 yr old male who presents today with chief complaint of nasal congestion.  Congestion has been present for 2 weeks.  Nasal congestion is described as yellow green and thick.  Congestion is associated with cough. Had trouble sleeping last night due to cough. He denies known fever.   Review of Systems See HPI  Past Medical History  Diagnosis Date  . Allergy   . Diabetes mellitus     type II  . Hyperlipidemia   . Colon polyps   . Orchitis and epididymitis 01/2008    left with left hydrocele  . Hypogonadism male 05/02/2011  . Hypertension     History   Social History  . Marital Status: Married    Spouse Name: Cicero Duck    Number of Children: 0  . Years of Education: N/A   Occupational History  . Unemployed    Social History Main Topics  . Smoking status: Never Smoker   . Smokeless tobacco: Never Used  . Alcohol Use: No  . Drug Use: No  . Sexually Active: Not on file   Other Topics Concern  . Not on file   Social History Narrative   Occupation: unemployed   Married since 1999   No children     Past Surgical History  Procedure Laterality Date  . Achilles tendon repair      right    Family History  Problem Relation Age of Onset  . Seizures Mother   . Hypertension Mother   . Cancer Brother     stomach  . Colon cancer Neg Hx   . Diabetes Neg Hx   . Heart attack Neg Hx   . Hyperlipidemia Neg Hx   . Sudden death Neg Hx     No Known Allergies  Current Outpatient Prescriptions on File Prior to Visit  Medication Sig Dispense Refill  . albuterol (PROAIR HFA) 108 (90 BASE) MCG/ACT inhaler 2 puffs every 4-6 hours as needed       . aspirin 81 MG tablet Take 81 mg by mouth daily.        . cetirizine (ZYRTEC) 10 MG tablet Take 10 mg by mouth daily.        . cloNIDine (CATAPRES) 0.3 MG tablet Take 1 tablet (0.3 mg total) by mouth 2 (two) times  daily.  60 tablet  5  . diltiazem (CARTIA XT) 240 MG 24 hr capsule Take 1 capsule (240 mg total) by mouth daily.  30 capsule  5  . glucose blood (ACCU-CHEK AVIVA) test strip Use as instructed three times daily to check blood sugar.  100 each  3  . hydrochlorothiazide (HYDRODIURIL) 25 MG tablet Take 1 tablet (25 mg total) by mouth daily.  30 tablet  5  . INVOKANA 100 MG TABS Take 100 mg by mouth daily.      Marland Kitchen labetalol (NORMODYNE) 300 MG tablet Take 1 tablet (300 mg total) by mouth 2 (two) times daily.  60 tablet  5  . Lancets (ACCU-CHEK MULTICLIX) lancets Use as instructed three times a day to check blood sugar. DX 250.00  102 each  3  . lisinopril (PRINIVIL,ZESTRIL) 40 MG tablet Take 1 tablet (40 mg total) by mouth daily.  30 tablet  5  . Omeprazole-Sodium Bicarbonate (ZEGERID) 20-1100 MG CAPS Take 1 capsule by mouth daily before breakfast.  30  each  5  . pravastatin (PRAVACHOL) 80 MG tablet Take 1 tablet (80 mg total) by mouth daily.  30 tablet  5  . SitaGLIPtin-MetFORMIN HCl [phone] MG TB24 Take 2 tablets by mouth 2 (two) times daily.       No current facility-administered medications on file prior to visit.    BP 136/96  Pulse 59  Temp(Src) 97.6 F (36.4 C) (Oral)  Resp 16  Wt 193 lb 1.3 oz (87.581 kg)  BMI 33.13 kg/m2  SpO2 98%       Objective:   Physical Exam  Constitutional: He is oriented to person, place, and time. He appears well-developed and well-nourished. No distress.  HENT:  Head: Normocephalic and atraumatic.  Bilateral cerumen impaction noted.   Cardiovascular: Normal rate and regular rhythm.   Murmur heard.  Systolic murmur is present with a grade of 2/6  Pulmonary/Chest: Breath sounds normal. No respiratory distress. He has no wheezes. He has no rales. He exhibits no tenderness.  Musculoskeletal: He exhibits no edema.  Lymphadenopathy:    He has no cervical adenopathy.  Neurological: He is alert and oriented to person, place, and time.  Skin: Skin is warm  and dry.  Psychiatric: He has a normal mood and affect. His behavior is normal. Judgment and thought content normal.          Assessment & Plan:

## 2012-07-05 NOTE — Patient Instructions (Addendum)

## 2012-07-05 NOTE — Assessment & Plan Note (Signed)
Will rx with Augmentin. Add tessalon prn cough.

## 2012-07-05 NOTE — Assessment & Plan Note (Signed)
Will refer for 2D echo to further evaluate.   

## 2012-07-12 ENCOUNTER — Other Ambulatory Visit (HOSPITAL_COMMUNITY): Payer: BC Managed Care – PPO

## 2012-07-12 ENCOUNTER — Other Ambulatory Visit (HOSPITAL_COMMUNITY): Payer: Self-pay | Admitting: Family

## 2012-07-14 ENCOUNTER — Other Ambulatory Visit (HOSPITAL_COMMUNITY): Payer: BC Managed Care – PPO

## 2012-07-20 ENCOUNTER — Ambulatory Visit (HOSPITAL_COMMUNITY): Payer: BC Managed Care – PPO | Attending: Family | Admitting: Radiology

## 2012-07-20 DIAGNOSIS — R011 Cardiac murmur, unspecified: Secondary | ICD-10-CM | POA: Insufficient documentation

## 2012-07-20 DIAGNOSIS — I10 Essential (primary) hypertension: Secondary | ICD-10-CM | POA: Insufficient documentation

## 2012-07-20 DIAGNOSIS — E119 Type 2 diabetes mellitus without complications: Secondary | ICD-10-CM | POA: Insufficient documentation

## 2012-07-20 DIAGNOSIS — E785 Hyperlipidemia, unspecified: Secondary | ICD-10-CM | POA: Insufficient documentation

## 2012-07-20 NOTE — Progress Notes (Signed)
Echocardiogram performed.  

## 2012-08-08 ENCOUNTER — Encounter: Payer: Self-pay | Admitting: Gastroenterology

## 2012-09-21 ENCOUNTER — Encounter: Payer: BC Managed Care – PPO | Admitting: Gastroenterology

## 2012-09-22 ENCOUNTER — Ambulatory Visit (INDEPENDENT_AMBULATORY_CARE_PROVIDER_SITE_OTHER): Payer: BC Managed Care – PPO | Admitting: Family

## 2012-09-22 ENCOUNTER — Telehealth: Payer: Self-pay | Admitting: *Deleted

## 2012-09-22 ENCOUNTER — Encounter: Payer: BC Managed Care – PPO | Admitting: Gastroenterology

## 2012-09-22 ENCOUNTER — Encounter: Payer: Self-pay | Admitting: Family

## 2012-09-22 VITALS — BP 126/80 | HR 53 | Temp 97.7°F | Resp 16 | Ht 64.0 in | Wt 197.0 lb

## 2012-09-22 DIAGNOSIS — E785 Hyperlipidemia, unspecified: Secondary | ICD-10-CM

## 2012-09-22 DIAGNOSIS — I350 Nonrheumatic aortic (valve) stenosis: Secondary | ICD-10-CM

## 2012-09-22 DIAGNOSIS — E119 Type 2 diabetes mellitus without complications: Secondary | ICD-10-CM

## 2012-09-22 DIAGNOSIS — I359 Nonrheumatic aortic valve disorder, unspecified: Secondary | ICD-10-CM

## 2012-09-22 DIAGNOSIS — I10 Essential (primary) hypertension: Secondary | ICD-10-CM

## 2012-09-22 LAB — BASIC METABOLIC PANEL
Calcium: 9.4 mg/dL (ref 8.4–10.5)
Sodium: 143 mEq/L (ref 135–145)

## 2012-09-22 MED ORDER — PRAVASTATIN SODIUM 80 MG PO TABS: 80.0000 mg | ORAL_TABLET | Freq: Every day | ORAL | Status: DC

## 2012-09-22 MED ORDER — LABETALOL HCL 300 MG PO TABS: 300.0000 mg | ORAL_TABLET | Freq: Two times a day (BID) | ORAL | Status: DC

## 2012-09-22 MED ORDER — HYDROCHLOROTHIAZIDE 25 MG PO TABS: 25.0000 mg | ORAL_TABLET | Freq: Every day | ORAL | Status: DC

## 2012-09-22 MED ORDER — DILTIAZEM HCL ER COATED BEADS 240 MG PO CP24: 240.0000 mg | ORAL_CAPSULE | Freq: Every day | ORAL | Status: DC

## 2012-09-22 MED ORDER — OMEPRAZOLE-SODIUM BICARBONATE 20-1100 MG PO CAPS: 1.0000 | ORAL_CAPSULE | Freq: Every day | ORAL | Status: DC

## 2012-09-22 MED ORDER — CLONIDINE HCL 0.3 MG PO TABS: 0.3000 mg | ORAL_TABLET | Freq: Two times a day (BID) | ORAL | Status: DC

## 2012-09-22 MED ORDER — LISINOPRIL 40 MG PO TABS: 40.0000 mg | ORAL_TABLET | Freq: Every day | ORAL | Status: DC

## 2012-09-22 NOTE — Progress Notes (Signed)
Subjective:    Patient ID: Keith Hardin, male    DOB: 04-29-1950, 62 y.o.   MRN: [id number]  HPI  [name] is a 62 yr old male who presents today for follow up.  1) DM2- he is followed by Endo-  Currently maintained on invokana and Janumet. Last A1C on record is 6.9 on 1/14 at Endo.  Missed apt last week, rescheduled.  Reports that his sugars at home are generally in 90-120.  Reports that his last eye exam was last summer.    2) HTN-BP is stable on lisinopril, hctz. Denies CP/SOB or swelling.    3) Cardiac murmur- noted last visit. 2D echo noted mild AS.    4) Hyperlipidemia- LDL 101 on pravastatin 80mg .  Denies myalgia.   Review of Systems See HPI  Past Medical History  Diagnosis Date  . Allergy   . Diabetes mellitus     type II  . Hyperlipidemia   . Colon polyps   . Orchitis and epididymitis 01/2008    left with left hydrocele  . Hypogonadism male 05/02/2011  . Hypertension     History   Social History  . Marital Status: Married    Spouse Name: Cicero Duck    Number of Children: 0  . Years of Education: N/A   Occupational History  . Unemployed    Social History Main Topics  . Smoking status: Never Smoker   . Smokeless tobacco: Never Used  . Alcohol Use: No  . Drug Use: No  . Sexually Active: Not on file   Other Topics Concern  . Not on file   Social History Narrative   Occupation: unemployed   Married since 1999   No children     Past Surgical History  Procedure Laterality Date  . Achilles tendon repair      right    Family History  Problem Relation Age of Onset  . Seizures Mother   . Hypertension Mother   . Cancer Brother     stomach  . Colon cancer Neg Hx   . Diabetes Neg Hx   . Heart attack Neg Hx   . Hyperlipidemia Neg Hx   . Sudden death Neg Hx     No Known Allergies  Current Outpatient Prescriptions on File Prior to Visit  Medication Sig Dispense Refill  . albuterol (PROAIR HFA) 108 (90 BASE) MCG/ACT inhaler 2 puffs every 4-6  hours as needed       . aspirin 81 MG tablet Take 81 mg by mouth daily.        . cetirizine (ZYRTEC) 10 MG tablet Take 10 mg by mouth daily.        . cloNIDine (CATAPRES) 0.3 MG tablet Take 1 tablet (0.3 mg total) by mouth 2 (two) times daily.  60 tablet  5  . diltiazem (CARTIA XT) 240 MG 24 hr capsule Take 1 capsule (240 mg total) by mouth daily.  30 capsule  5  . glucose blood (ACCU-CHEK AVIVA) test strip Use as instructed three times daily to check blood sugar.  100 each  3  . hydrochlorothiazide (HYDRODIURIL) 25 MG tablet Take 1 tablet (25 mg total) by mouth daily.  30 tablet  5  . INVOKANA 100 MG TABS Take 100 mg by mouth daily.      Marland Kitchen labetalol (NORMODYNE) 300 MG tablet Take 1 tablet (300 mg total) by mouth 2 (two) times daily.  60 tablet  5  . Lancets (ACCU-CHEK MULTICLIX) lancets Use as instructed  three times a day to check blood sugar. DX 250.00  102 each  3  . lisinopril (PRINIVIL,ZESTRIL) 40 MG tablet Take 1 tablet (40 mg total) by mouth daily.  30 tablet  5  . Omeprazole-Sodium Bicarbonate (ZEGERID) 20-1100 MG CAPS Take 1 capsule by mouth daily before breakfast.  30 each  5  . pravastatin (PRAVACHOL) 80 MG tablet Take 1 tablet (80 mg total) by mouth daily.  30 tablet  5   No current facility-administered medications on file prior to visit.    BP 126/80  Pulse 53  Temp(Src) 97.7 F (36.5 C) (Oral)  Resp 16  Ht 5\' 4"  (1.626 m)  Wt 197 lb 0.6 oz (89.377 kg)  BMI 33.81 kg/m2  SpO2 97%       Objective:   Physical Exam  Constitutional: He is oriented to person, place, and time. He appears well-developed and well-nourished. No distress.  HENT:  Head: Normocephalic and atraumatic.  Cardiovascular: Normal rate and regular rhythm.   Murmur heard. Pulmonary/Chest: Effort normal and breath sounds normal. No respiratory distress. He has no wheezes. He has no rales. He exhibits no tenderness.  Musculoskeletal: He exhibits no edema.  Neurological: He is alert and oriented to  person, place, and time.  Skin: Skin is warm and dry.  Psychiatric: He has a normal mood and affect. His behavior is normal. Judgment and thought content normal.          Assessment & Plan:

## 2012-09-22 NOTE — Patient Instructions (Addendum)
Please complete lab work prior to leaving.   Please schedule a follow up appointment in 3 months.  

## 2012-09-22 NOTE — Telephone Encounter (Signed)
Per verbal from Provider, cancel labetalol and pravastatin at [facility] as pt preferred [facility]. Rx cancelled.

## 2012-09-24 NOTE — Assessment & Plan Note (Signed)
Stable on statin, reinforced low cholesterol diet.

## 2012-09-24 NOTE — Assessment & Plan Note (Signed)
Clinically asymptomatic, monitor.

## 2012-09-24 NOTE — Assessment & Plan Note (Signed)
BP Readings from Last 3 Encounters:  09/22/12 126/80  07/05/12 136/96  06/23/12 134/70   Stable. Continue current meds.

## 2012-09-24 NOTE — Assessment & Plan Note (Signed)
Stable.  Management per Endo.  

## 2012-09-28 ENCOUNTER — Encounter: Payer: Self-pay | Admitting: Family

## 2012-12-22 ENCOUNTER — Encounter: Payer: Self-pay | Admitting: Family

## 2012-12-22 ENCOUNTER — Ambulatory Visit (INDEPENDENT_AMBULATORY_CARE_PROVIDER_SITE_OTHER): Payer: BC Managed Care – PPO | Admitting: Family

## 2012-12-22 VITALS — BP 126/76 | HR 59 | Temp 97.9°F | Resp 18 | Ht 64.0 in | Wt 199.0 lb

## 2012-12-22 DIAGNOSIS — E119 Type 2 diabetes mellitus without complications: Secondary | ICD-10-CM

## 2012-12-22 DIAGNOSIS — I10 Essential (primary) hypertension: Secondary | ICD-10-CM

## 2012-12-22 DIAGNOSIS — Z23 Encounter for immunization: Secondary | ICD-10-CM

## 2012-12-22 DIAGNOSIS — E785 Hyperlipidemia, unspecified: Secondary | ICD-10-CM

## 2012-12-22 LAB — HEPATIC FUNCTION PANEL
Bilirubin, Direct: 0.1 mg/dL (ref 0.0–0.3)
Total Bilirubin: 0.4 mg/dL (ref 0.3–1.2)

## 2012-12-22 LAB — BASIC METABOLIC PANEL
CO2: 27 mEq/L (ref 19–32)
Chloride: 105 mEq/L (ref 96–112)
Sodium: 142 mEq/L (ref 135–145)

## 2012-12-22 NOTE — Assessment & Plan Note (Signed)
bp stable on current meds. Continue same.  

## 2012-12-22 NOTE — Assessment & Plan Note (Signed)
Reports stable blood sugars, management per endo.

## 2012-12-22 NOTE — Patient Instructions (Addendum)
Please complete your lab work prior to leaving. Schedule a follow up eye exam.  Follow up in 3 months, sooner if problems/concerns.

## 2012-12-22 NOTE — Addendum Note (Signed)
Addended by: Mervin Kung A on: 12/22/2012 04:30 PM   Modules accepted: Orders

## 2012-12-22 NOTE — Assessment & Plan Note (Signed)
LDL 101, continue statin, discussed importance of low cholesterol diet.

## 2012-12-22 NOTE — Progress Notes (Signed)
Subjective:    Patient ID: Keith Hardin, male    DOB: [birth year], 62 y.o.   MRN: [id number]  HPI  Keith Hardin is a 62 yr old male who presents today for follow up.  1) HTN- he is currently maintained on lisinopril, diltiazem,hctz.  He denies CP/SOB or swelling.   2) DM2- currently maintained on janumet, invokana. His diabetes is managed by endocrinology. Reports that he saw endo last month was told that his A1c was "7 something."  Last eye exam was 1 year ago.    3) Hyperlipidemia-currently maintained on pravastatin.   Review of Systems See HPI  Past Medical History  Diagnosis Date  . Allergy   . Diabetes mellitus     type II  . Hyperlipidemia   . Colon polyps   . Orchitis and epididymitis 01/2008    left with left hydrocele  . Hypogonadism male 05/02/2011  . Hypertension     History   Social History  . Marital Status: Married    Spouse Name: Cicero Duck    Number of Children: 0  . Years of Education: N/A   Occupational History  . Unemployed    Social History Main Topics  . Smoking status: Never Smoker   . Smokeless tobacco: Never Used  . Alcohol Use: No  . Drug Use: No  . Sexual Activity: Not on file   Other Topics Concern  . Not on file   Social History Narrative   Occupation: unemployed   Married since 1999   No children     Past Surgical History  Procedure Laterality Date  . Achilles tendon repair      right    Family History  Problem Relation Age of Onset  . Seizures Mother   . Hypertension Mother   . Cancer Brother     stomach  . Colon cancer Neg Hx   . Diabetes Neg Hx   . Heart attack Neg Hx   . Hyperlipidemia Neg Hx   . Sudden death Neg Hx     No Known Allergies  Current Outpatient Prescriptions on File Prior to Visit  Medication Sig Dispense Refill  . aspirin 81 MG tablet Take 81 mg by mouth daily.        . cetirizine (ZYRTEC) 10 MG tablet Take 10 mg by mouth daily.        . cloNIDine (CATAPRES) 0.3 MG tablet Take 1 tablet (0.3  mg total) by mouth 2 (two) times daily.  60 tablet  5  . diltiazem (CARTIA XT) 240 MG 24 hr capsule Take 1 capsule (240 mg total) by mouth daily.  30 capsule  5  . glucose blood (ACCU-CHEK AVIVA) test strip Use as instructed three times daily to check blood sugar.  100 each  3  . hydrochlorothiazide (HYDRODIURIL) 25 MG tablet Take 1 tablet (25 mg total) by mouth daily.  30 tablet  5  . INVOKANA 100 MG TABS Take 100 mg by mouth daily.      Marland Kitchen labetalol (NORMODYNE) 300 MG tablet Take 1 tablet (300 mg total) by mouth 2 (two) times daily.  60 tablet  5  . Lancets (ACCU-CHEK MULTICLIX) lancets Use as instructed three times a day to check blood sugar. DX 250.00  102 each  3  . lisinopril (PRINIVIL,ZESTRIL) 40 MG tablet Take 1 tablet (40 mg total) by mouth daily.  30 tablet  5  . Omeprazole-Sodium Bicarbonate (ZEGERID) 20-1100 MG CAPS Take 1 capsule by mouth daily before breakfast.  30 each  5  . pravastatin (PRAVACHOL) 80 MG tablet Take 1 tablet (80 mg total) by mouth daily.  30 tablet  5  . sitaGLIPtan-metformin (JANUMET) 50-1000 MG per tablet Take 1 tablet by mouth 2 (two) times daily with a meal.       No current facility-administered medications on file prior to visit.    BP 126/76  Pulse 59  Temp(Src) 97.9 F (36.6 C) (Oral)  Resp 18  Ht 5\' 4"  (1.626 m)  Wt 199 lb (90.266 kg)  BMI 34.14 kg/m2  SpO2 99%       Objective:   Physical Exam  Constitutional: He is oriented to person, place, and time. He appears well-developed and well-nourished. No distress.  HENT:  Head: Normocephalic and atraumatic.  Cardiovascular: Normal rate and regular rhythm.   No murmur heard. Pulmonary/Chest: Effort normal and breath sounds normal. No respiratory distress. He has no wheezes. He has no rales. He exhibits no tenderness.  Musculoskeletal: He exhibits no edema.  Neurological: He is alert and oriented to person, place, and time.  Skin: Skin is warm and dry.  Psychiatric: He has a normal mood and  affect. His behavior is normal. Judgment and thought content normal.          Assessment & Plan:

## 2012-12-23 LAB — MICROALBUMIN / CREATININE URINE RATIO
Creatinine, Urine: 71.3 mg/dL
Microalb Creat Ratio: 7.3 mg/g (ref 0.0–30.0)
Microalb, Ur: 0.52 mg/dL (ref 0.00–1.89)

## 2012-12-24 ENCOUNTER — Encounter: Payer: Self-pay | Admitting: Family

## 2013-01-12 ENCOUNTER — Other Ambulatory Visit: Payer: Self-pay | Admitting: Internal Medicine

## 2013-02-07 ENCOUNTER — Other Ambulatory Visit: Payer: Self-pay | Admitting: Family

## 2013-02-07 NOTE — Telephone Encounter (Signed)
Rx request to pharmacy/SLS  

## 2013-03-30 ENCOUNTER — Other Ambulatory Visit: Payer: Self-pay | Admitting: Family

## 2013-04-06 ENCOUNTER — Ambulatory Visit (INDEPENDENT_AMBULATORY_CARE_PROVIDER_SITE_OTHER): Payer: BC Managed Care – PPO | Admitting: Family

## 2013-04-06 ENCOUNTER — Encounter: Payer: Self-pay | Admitting: Family

## 2013-04-06 VITALS — BP 110/80 | HR 60 | Temp 97.9°F | Resp 16 | Ht 64.0 in | Wt 195.0 lb

## 2013-04-06 DIAGNOSIS — Z23 Encounter for immunization: Secondary | ICD-10-CM

## 2013-04-06 DIAGNOSIS — E785 Hyperlipidemia, unspecified: Secondary | ICD-10-CM

## 2013-04-06 DIAGNOSIS — I10 Essential (primary) hypertension: Secondary | ICD-10-CM

## 2013-04-06 DIAGNOSIS — E119 Type 2 diabetes mellitus without complications: Secondary | ICD-10-CM

## 2013-04-06 LAB — BASIC METABOLIC PANEL
BUN: 20 mg/dL (ref 6–23)
Potassium: 4 mEq/L (ref 3.5–5.3)
Sodium: 140 mEq/L (ref 135–145)

## 2013-04-06 LAB — HEPATIC FUNCTION PANEL
ALT: 15 U/L (ref 0–53)
AST: 18 U/L (ref 0–37)
Bilirubin, Direct: 0.1 mg/dL (ref 0.0–0.3)
Total Protein: 6.8 g/dL (ref 6.0–8.3)

## 2013-04-06 LAB — HEMOGLOBIN A1C: Hgb A1c MFr Bld: 6.9 % — ABNORMAL HIGH (ref <5.7)

## 2013-04-06 LAB — LIPID PANEL
HDL: 34 mg/dL — ABNORMAL LOW (ref >39)
LDL Cholesterol: 79 mg/dL (ref 0–99)
Total CHOL/HDL Ratio: 4.5 Ratio

## 2013-04-06 MED ORDER — LABETALOL HCL 300 MG PO TABS: 300.0000 mg | ORAL_TABLET | Freq: Two times a day (BID) | ORAL | Status: DC

## 2013-04-06 MED ORDER — LISINOPRIL 40 MG PO TABS: 40.0000 mg | ORAL_TABLET | Freq: Every day | ORAL | Status: DC

## 2013-04-06 MED ORDER — PRAVASTATIN SODIUM 80 MG PO TABS: 80.0000 mg | ORAL_TABLET | Freq: Every day | ORAL | Status: DC

## 2013-04-06 MED ORDER — HYDROCHLOROTHIAZIDE 25 MG PO TABS: 25.0000 mg | ORAL_TABLET | Freq: Every day | ORAL | Status: DC

## 2013-04-06 MED ORDER — OMEPRAZOLE-SODIUM BICARBONATE 20-1100 MG PO CAPS: 1.0000 | ORAL_CAPSULE | Freq: Every day | ORAL | Status: DC

## 2013-04-06 NOTE — Progress Notes (Signed)
Subjective:    Patient ID: Keith Hardin, male    DOB: 21-Jun-1950, 62 y.o.   MRN: [id number]  HPI  Keith Hardin is a 62 yr old male who presents today for follow up.  1) HTN-  BP Readings from Last 3 Encounters:  04/06/13 110/80  12/22/12 126/76  09/22/12 126/80     2) DM2- he is followed by Endo. Reports that sugars are running 90-120.  As upcoming apt in January.  Reports eye exam yesterday- told normal.  3) Hyperlipidemia-  Lab Results  Component Value Date   LDLCALC 101* 06/23/2012      Review of Systems See HPI  Past Medical History  Diagnosis Date  . Allergy   . Diabetes mellitus     type II  . Hyperlipidemia   . Colon polyps   . Orchitis and epididymitis 01/2008    left with left hydrocele  . Hypogonadism male 05/02/2011  . Hypertension     History   Social History  . Marital Status: Married    Spouse Name: Cicero Duck    Number of Children: 0  . Years of Education: N/A   Occupational History  . Unemployed    Social History Main Topics  . Smoking status: Never Smoker   . Smokeless tobacco: Never Used  . Alcohol Use: No  . Drug Use: No  . Sexual Activity: Not on file   Other Topics Concern  . Not on file   Social History Narrative   Occupation: unemployed   Married since 1999   No children     Past Surgical History  Procedure Laterality Date  . Achilles tendon repair      right    Family History  Problem Relation Age of Onset  . Seizures Mother   . Hypertension Mother   . Cancer Brother     stomach  . Colon cancer Neg Hx   . Diabetes Neg Hx   . Heart attack Neg Hx   . Hyperlipidemia Neg Hx   . Sudden death Neg Hx     No Known Allergies  Current Outpatient Prescriptions on File Prior to Visit  Medication Sig Dispense Refill  . ACCU-CHEK AVIVA test strip Use to test blood sugar three times daily.  50 each  1  . aspirin 81 MG tablet Take 81 mg by mouth daily.        Marland Kitchen CARTIA XT 240 MG 24 hr capsule TAKE ONE CAPSULE BY MOUTH  DAILY  30 capsule  0  . cetirizine (ZYRTEC) 10 MG tablet Take 10 mg by mouth daily.        . cloNIDine (CATAPRES) 0.3 MG tablet TAKE 1 TABLET BY MOUTH TWICE DAILY  60 tablet  2  . hydrochlorothiazide (HYDRODIURIL) 25 MG tablet Take 1 tablet (25 mg total) by mouth daily.  30 tablet  5  . INVOKANA 100 MG TABS Take 100 mg by mouth daily.      Marland Kitchen labetalol (NORMODYNE) 300 MG tablet Take 1 tablet (300 mg total) by mouth 2 (two) times daily.  60 tablet  5  . Lancets (ACCU-CHEK MULTICLIX) lancets Use as directed 3 times daily to check blood sugar  102 each  2  . lisinopril (PRINIVIL,ZESTRIL) 40 MG tablet Take 1 tablet (40 mg total) by mouth daily.  30 tablet  5  . Omeprazole-Sodium Bicarbonate (ZEGERID) 20-1100 MG CAPS Take 1 capsule by mouth daily before breakfast.  30 each  5  . pravastatin (PRAVACHOL) 80 MG  tablet Take 1 tablet (80 mg total) by mouth daily.  30 tablet  5  . sitaGLIPtan-metformin (JANUMET) 50-1000 MG per tablet Take 1 tablet by mouth 2 (two) times daily with a meal.       No current facility-administered medications on file prior to visit.    BP 110/80  Pulse 60  Temp(Src) 97.9 F (36.6 C) (Oral)  Resp 16  Ht 5\' 4"  (1.626 m)  Wt 195 lb 0.6 oz (88.47 kg)  BMI 33.46 kg/m2  SpO2 99%       Objective:   Physical Exam  Constitutional: He is oriented to person, place, and time. He appears well-developed and well-nourished. No distress.  Cardiovascular: Normal rate and regular rhythm.   No murmur heard. Pulmonary/Chest: Effort normal and breath sounds normal. No respiratory distress. He has no wheezes. He has no rales. He exhibits no tenderness.  Neurological: He is alert and oriented to person, place, and time.  Psychiatric: He has a normal mood and affect. His behavior is normal. Judgment and thought content normal.          Assessment & Plan:

## 2013-04-06 NOTE — Assessment & Plan Note (Signed)
Clinically stable on janumet. Continue same.  2nd pneumovax today. Keep follow up with endo.

## 2013-04-06 NOTE — Assessment & Plan Note (Signed)
Continue Statin, obtain LFT, LDL almost at goal at 101.

## 2013-04-06 NOTE — Patient Instructions (Signed)
Please complete lab work prior to leaving. Follow up in 3 months.  

## 2013-04-06 NOTE — Assessment & Plan Note (Signed)
BP stable. Continue current meds.  Obtain bmet. 

## 2013-04-07 ENCOUNTER — Other Ambulatory Visit: Payer: Self-pay | Admitting: Family

## 2013-04-09 ENCOUNTER — Telehealth: Payer: Self-pay | Admitting: Family

## 2013-04-09 NOTE — Telephone Encounter (Signed)
Sugar at goal. Trigs high- add fish oil 1000mg  bid.  Continue to work on diabetic diet. Avoid concentrated sweets.  Liver function, kidney function normal.

## 2013-04-10 NOTE — Telephone Encounter (Signed)
Left detailed message on cell and to call if any questions. 

## 2013-04-11 NOTE — Telephone Encounter (Signed)
Pt called back to clarify fish oil dosing and voices understanding.

## 2013-05-04 ENCOUNTER — Other Ambulatory Visit: Payer: Self-pay | Admitting: Family

## 2013-05-04 NOTE — Telephone Encounter (Signed)
Rx request to pharmacy/SLS  

## 2013-05-07 ENCOUNTER — Telehealth: Payer: Self-pay | Admitting: *Deleted

## 2013-05-07 MED ORDER — CLONIDINE HCL 0.3 MG PO TABS: ORAL_TABLET | ORAL | Status: DC

## 2013-05-07 NOTE — Telephone Encounter (Signed)
Pt left message that pharmacy never received refills from December. Verified with pharmacist that they received refills on: pravastatin, zegerid, labetalol, hctz and lisinopril in December and he has refills remaining.  Clonidine is the only medication that is out of refills. Refill sent. Left detailed message on pt's home # re Rx status and that I would send clonidine refill and to call if he has any further concerns.

## 2013-05-10 ENCOUNTER — Other Ambulatory Visit: Payer: Self-pay | Admitting: Family

## 2013-05-11 ENCOUNTER — Other Ambulatory Visit: Payer: Self-pay | Admitting: Family

## 2013-06-06 ENCOUNTER — Other Ambulatory Visit: Payer: Self-pay | Admitting: Family

## 2013-06-16 ENCOUNTER — Telehealth: Payer: Self-pay | Admitting: *Deleted

## 2013-06-16 NOTE — Telephone Encounter (Signed)
Message copied by Ronny Flurry on Sat Jun 16, 2013 11:26 AM ------      Message from: O'SULLIVAN, MELISSA      Created: Wed Apr 18, 2013  2:05 PM       pls document negative retinopathy 04/05/13 ------

## 2013-06-16 NOTE — Telephone Encounter (Signed)
Health Maintenance  updated

## 2013-06-21 ENCOUNTER — Other Ambulatory Visit: Payer: Self-pay | Admitting: Family

## 2013-07-02 ENCOUNTER — Ambulatory Visit (INDEPENDENT_AMBULATORY_CARE_PROVIDER_SITE_OTHER): Payer: BC Managed Care – PPO | Admitting: Family

## 2013-07-02 ENCOUNTER — Encounter: Payer: Self-pay | Admitting: Family

## 2013-07-02 VITALS — BP 130/78 | HR 55 | Temp 97.8°F | Resp 18 | Ht 64.0 in | Wt 198.0 lb

## 2013-07-02 DIAGNOSIS — I10 Essential (primary) hypertension: Secondary | ICD-10-CM

## 2013-07-02 DIAGNOSIS — E119 Type 2 diabetes mellitus without complications: Secondary | ICD-10-CM

## 2013-07-02 DIAGNOSIS — E785 Hyperlipidemia, unspecified: Secondary | ICD-10-CM

## 2013-07-02 LAB — HEMOGLOBIN A1C
HEMOGLOBIN A1C: 6.9 % — AB (ref <5.7)
MEAN PLASMA GLUCOSE: 151 mg/dL — AB (ref <117)

## 2013-07-02 LAB — BASIC METABOLIC PANEL WITH GFR
BUN: 18 mg/dL (ref 6–23)
CHLORIDE: 103 meq/L (ref 96–112)
CO2: 29 mEq/L (ref 19–32)
CREATININE: 1.06 mg/dL (ref 0.50–1.35)
Calcium: 9.4 mg/dL (ref 8.4–10.5)
GFR, Est African American: 87 mL/min
GFR, Est Non African American: 75 mL/min
GLUCOSE: 108 mg/dL — AB (ref 70–99)
Potassium: 3.7 mEq/L (ref 3.5–5.3)
SODIUM: 142 meq/L (ref 135–145)

## 2013-07-02 NOTE — Progress Notes (Signed)
Subjective:    Patient ID: Keith Hardin, male    DOB: [birth year], 63 y.o.   MRN: [id number]  Hypertension Pertinent negatives include no chest pain, headaches or shortness of breath.  Hyperlipidemia Pertinent negatives include no chest pain or shortness of breath.  Diabetes Pertinent negatives for hypoglycemia include no dizziness or headaches. Pertinent negatives for diabetes include no chest pain and no polyuria.   Keith Hardin is a 63 year old male who presents today for follow up.  Hypertension) Denies cough, chest pain, shortness of breath.  BP Readings from Last 3 Encounters:  07/02/13 130/78  04/06/13 110/80  12/22/12 126/76    Hyperlipidemia) patient taking pravastatin, denies myalgias. 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 Denies regular exercise.  Diabetes) Last A1C was above goal at 6.9, denies polyuria. Last eye exam was 12/14.    Review of Systems  Respiratory: Negative for cough and shortness of breath.   Cardiovascular: Negative for chest pain and leg swelling.  Endocrine: Negative for polyuria.  Genitourinary: Negative for difficulty urinating.  Neurological: Negative for dizziness and headaches.   Past Medical History  Diagnosis Date  . Allergy   . Diabetes mellitus     type II  . Hyperlipidemia   . Colon polyps   . Orchitis and epididymitis 01/2008    left with left hydrocele  . Hypogonadism male 05/02/2011  . Hypertension     History   Social History  . Marital Status: Married    Spouse Name: Keith Hardin    Number of Children: 0  . Years of Education: N/A   Occupational History  . Unemployed    Social History Main Topics  . Smoking status: Never Smoker   . Smokeless tobacco: Never Used  . Alcohol Use: No  . Drug Use: No  . Sexual Activity: Not on file   Other Topics Concern  . Not on file   Social History Narrative   Occupation: unemployed   Married since 1999   No children      Past Surgical History  Procedure Laterality Date  . Achilles tendon repair      right    Family History  Problem Relation Age of Onset  . Seizures Mother   . Hypertension Mother   . Cancer Brother     stomach  . Colon cancer Neg Hx   . Diabetes Neg Hx   . Heart attack Neg Hx   . Hyperlipidemia Neg Hx   . Sudden death Neg Hx     No Known Allergies  Current Outpatient Prescriptions on File Prior to Visit  Medication Sig Dispense Refill  . ACCU-CHEK AVIVA test strip Use to test blood sugar three times daily.  50 each  1  . aspirin 81 MG tablet Take 81 mg by mouth daily.        Marland Kitchen CARTIA XT 240 MG 24 hr capsule TAKE ONE CAPSULE BY MOUTH EVERY DAY  30 capsule  0  . cetirizine (ZYRTEC) 10 MG tablet Take 10 mg by mouth daily.        . cloNIDine (CATAPRES) 0.3 MG tablet TAKE 1 TABLET BY MOUTH TWICE DAILY  60 tablet  4  . hydrochlorothiazide (HYDRODIURIL) 25 MG tablet Take 1 tablet (25 mg total) by mouth daily.  30 tablet  5  . INVOKANA 100 MG TABS Take 100 mg by mouth daily.      Marland Kitchen labetalol (NORMODYNE) 300 MG tablet  Take one tablet by mouth twice daily  60 tablet  4  . Lancets (ACCU-CHEK MULTICLIX) lancets Use as directed 3 times daily to check blood sugar  102 each  2  . lisinopril (PRINIVIL,ZESTRIL) 40 MG tablet Take 1 tablet (40 mg total) by mouth daily.  30 tablet  5  . Omega-3 Fatty Acids (FISH OIL) 1000 MG CAPS Take 1 capsule by mouth 2 (two) times daily.      Earney Navy Bicarbonate (ZEGERID) 20-1100 MG CAPS capsule Take 1 capsule by mouth daily before breakfast.  30 each  5  . pravastatin (PRAVACHOL) 80 MG tablet Take 1 tablet (80 mg total) by mouth daily.  30 tablet  5  . sitaGLIPtan-metformin (JANUMET) 50-1000 MG per tablet Take 1 tablet by mouth 2 (two) times daily with a meal.       No current facility-administered medications on file prior to visit.    BP 130/78  Pulse 55  Temp(Src) 97.8 F (36.6 C) (Oral)  Resp 18  Ht 5\' 4"  (1.626 m)  Wt 198 lb 0.6  oz (89.83 kg)  BMI 33.98 kg/m2  SpO2 98%        Objective:   Physical Exam  Constitutional: He is oriented to person, place, and time. He appears well-nourished.  Cardiovascular: Normal rate, regular rhythm, S1 normal, S2 normal, normal heart sounds and intact distal pulses.   Pulses:      Radial pulses are 2+ on the right side, and 2+ on the left side.       Dorsalis pedis pulses are 2+ on the right side, and 2+ on the left side.       Posterior tibial pulses are 2+ on the right side, and 2+ on the left side.  Pulmonary/Chest: Effort normal and breath sounds normal. No respiratory distress.  Musculoskeletal: He exhibits no edema.  Neurological: He is alert and oriented to person, place, and time.  Skin: Skin is warm and dry.  Psychiatric: He has a normal mood and affect.          Assessment & Plan:  I have personally seen and examined patient and agree with Alma Friendly NP-student's assessment and plan.

## 2013-07-02 NOTE — Progress Notes (Signed)
Pre visit review using our clinic review tool, if applicable. No additional management support is needed unless otherwise documented below in the visit note. 

## 2013-07-02 NOTE — Assessment & Plan Note (Signed)
BP stable, continue medications, obtain BMET today.

## 2013-07-02 NOTE — Assessment & Plan Note (Signed)
Stable, continue current medications, denies myalgias.

## 2013-07-02 NOTE — Assessment & Plan Note (Signed)
Clinically stable on medications, continue same. Obtain A1C today.

## 2013-07-02 NOTE — Patient Instructions (Signed)
Please complete your lab work prior to leaving. Follow up in 3 months.   

## 2013-07-03 ENCOUNTER — Telehealth: Payer: Self-pay | Admitting: Family

## 2013-07-03 ENCOUNTER — Encounter: Payer: Self-pay | Admitting: Family

## 2013-07-03 ENCOUNTER — Other Ambulatory Visit: Payer: Self-pay | Admitting: Family

## 2013-07-03 NOTE — Telephone Encounter (Signed)
Relevant patient education assigned to patient using Emmi. ° °

## 2013-07-04 ENCOUNTER — Telehealth: Payer: Self-pay

## 2013-07-04 NOTE — Telephone Encounter (Signed)
Relevant patient education assigned to patient using Emmi. ° °

## 2013-07-20 ENCOUNTER — Telehealth: Payer: Self-pay | Admitting: Family

## 2013-07-20 ENCOUNTER — Encounter: Payer: Self-pay | Admitting: Family

## 2013-07-20 ENCOUNTER — Ambulatory Visit (INDEPENDENT_AMBULATORY_CARE_PROVIDER_SITE_OTHER): Payer: BC Managed Care – PPO | Admitting: Family

## 2013-07-20 VITALS — BP 118/64 | HR 51 | Temp 97.5°F | Resp 16 | Ht 64.0 in | Wt 199.0 lb

## 2013-07-20 DIAGNOSIS — IMO0002 Reserved for concepts with insufficient information to code with codable children: Secondary | ICD-10-CM

## 2013-07-20 DIAGNOSIS — J309 Allergic rhinitis, unspecified: Secondary | ICD-10-CM

## 2013-07-20 DIAGNOSIS — S90424A Blister (nonthermal), right lesser toe(s), initial encounter: Secondary | ICD-10-CM | POA: Insufficient documentation

## 2013-07-20 NOTE — Assessment & Plan Note (Signed)
No sign of infection. Advised pt to monitor toe. Call if redness/swelling occurs, otherwise, not to "pick" at blister.

## 2013-07-20 NOTE — Telephone Encounter (Signed)
Patient calling in wanting to know if we have anymore discount cards for [employer]?

## 2013-07-20 NOTE — Progress Notes (Signed)
Subjective:    Patient ID: Keith Hardin, male    DOB: 11-13-1950, 63 y.o.   MRN: [id number]  HPI  Keith Hardin is a 63 yr old male who presents today with chief complaint of right second toe pain.  Pain has been present for 2 days. Was wearing steel toe shoes.    Sinus congestion- x 1-2 weeks. Trouble breathing out of his nose.  Allegra not helping.  Review of Systems See HPI  Past Medical History  Diagnosis Date  . Allergy   . Diabetes mellitus     type II  . Hyperlipidemia   . Colon polyps   . Orchitis and epididymitis 01/2008    left with left hydrocele  . Hypogonadism male 05/02/2011  . Hypertension     History   Social History  . Marital Status: Married    Spouse Name: [name]    Number of Children: 0  . Years of Education: N/A   Occupational History  . Unemployed    Social History Main Topics  . Smoking status: Never Smoker   . Smokeless tobacco: Never Used  . Alcohol Use: No  . Drug Use: No  . Sexual Activity: Not on file   Other Topics Concern  . Not on file   Social History Narrative   Occupation: unemployed   Married since 1999   No children     Past Surgical History  Procedure Laterality Date  . Achilles tendon repair      right    Family History  Problem Relation Age of Onset  . Seizures Mother   . Hypertension Mother   . Cancer Brother     stomach  . Colon cancer Neg Hx   . Diabetes Neg Hx   . Heart attack Neg Hx   . Hyperlipidemia Neg Hx   . Sudden death Neg Hx     No Known Allergies  Current Outpatient Prescriptions on File Prior to Visit  Medication Sig Dispense Refill  . ACCU-CHEK AVIVA test strip Use to test blood sugar three times daily.  50 each  1  . aspirin 81 MG tablet Take 81 mg by mouth daily.        Marland Kitchen CARTIA XT 240 MG 24 hr capsule TAKE ONE CAPSULE BY MOUTH EVERY DAY  30 capsule  0  . cloNIDine (CATAPRES) 0.3 MG tablet TAKE 1 TABLET BY MOUTH TWICE DAILY  60 tablet  4  . hydrochlorothiazide (HYDRODIURIL) 25 MG  tablet Take 1 tablet (25 mg total) by mouth daily.  30 tablet  5  . INVOKANA 100 MG TABS Take 100 mg by mouth daily.      Marland Kitchen labetalol (NORMODYNE) 300 MG tablet Take one tablet by mouth twice daily  60 tablet  4  . Lancets (ACCU-CHEK MULTICLIX) lancets Use as directed 3 times daily to check blood sugar  102 each  2  . lisinopril (PRINIVIL,ZESTRIL) 40 MG tablet Take 1 tablet (40 mg total) by mouth daily.  30 tablet  5  . Omega-3 Fatty Acids (FISH OIL) 1000 MG CAPS Take 1 capsule by mouth 2 (two) times daily.      Earney Navy Bicarbonate (ZEGERID) 20-1100 MG CAPS capsule Take 1 capsule by mouth daily before breakfast.  30 each  5  . pravastatin (PRAVACHOL) 80 MG tablet Take 1 tablet (80 mg total) by mouth daily.  30 tablet  5  . sitaGLIPtan-metformin (JANUMET) 50-1000 MG per tablet Take 1 tablet by mouth 2 (two) times  daily with a meal.       No current facility-administered medications on file prior to visit.    BP 118/64  Pulse 51  Temp(Src) 97.5 F (36.4 C) (Oral)  Resp 16  Ht 5\' 4"  (1.626 m)  Wt 199 lb 0.6 oz (90.284 kg)  BMI 34.15 kg/m2  SpO2 98%       Objective:   Physical Exam  Constitutional: He is oriented to person, place, and time. He appears well-developed and well-nourished. No distress.  HENT:  Head: Normocephalic and atraumatic.  Cardiovascular: Normal rate and regular rhythm.   No murmur heard. Pulmonary/Chest: Effort normal and breath sounds normal. No respiratory distress. He has no wheezes. He has no rales. He exhibits no tenderness.  Musculoskeletal: He exhibits no edema.  Neurological: He is alert and oriented to person, place, and time.  Skin:  Right second toe has small intact fluid filled blister. No erythema, no swelling noted of toe.  Psychiatric: He has a normal mood and affect. His behavior is normal. Judgment and thought content normal.          Assessment & Plan:

## 2013-07-20 NOTE — Patient Instructions (Signed)
Continue allegra, start OTC flonase. Call if sinus symptoms worsen or if they do not improve.  Call if blister on toe becomes red/hot swollen or if not resolved in 1-2 weeks.

## 2013-07-20 NOTE — Assessment & Plan Note (Signed)
Continue allegra, add otc flonase.

## 2013-07-20 NOTE — Telephone Encounter (Signed)
Discount card placed at front desk and pt made aware.

## 2013-07-20 NOTE — Progress Notes (Signed)
Pre visit review using our clinic review tool, if applicable. No additional management support is needed unless otherwise documented below in the visit note. 

## 2013-08-03 ENCOUNTER — Other Ambulatory Visit: Payer: Self-pay | Admitting: Family

## 2013-09-25 ENCOUNTER — Other Ambulatory Visit: Payer: Self-pay | Admitting: Family

## 2013-09-27 ENCOUNTER — Other Ambulatory Visit: Payer: Self-pay | Admitting: Family

## 2013-09-27 ENCOUNTER — Telehealth: Payer: Self-pay | Admitting: Family

## 2013-09-27 MED ORDER — CLONIDINE HCL 0.3 MG PO TABS: ORAL_TABLET | ORAL | Status: DC

## 2013-09-27 NOTE — Telephone Encounter (Signed)
Rx request to pharmacy/SLS  

## 2013-09-27 NOTE — Telephone Encounter (Signed)
Refill- clonidine  [facility] [address] in [place]

## 2013-09-28 ENCOUNTER — Other Ambulatory Visit: Payer: Self-pay | Admitting: Family

## 2013-09-28 NOTE — Telephone Encounter (Signed)
   Last rx was done on 09-27-13 quantity 60 with 4 refills

## 2013-09-30 ENCOUNTER — Other Ambulatory Visit: Payer: Self-pay | Admitting: Family

## 2013-10-15 ENCOUNTER — Ambulatory Visit: Payer: BC Managed Care – PPO | Admitting: Family

## 2013-10-15 ENCOUNTER — Telehealth: Payer: Self-pay | Admitting: Family

## 2013-10-15 DIAGNOSIS — Z0289 Encounter for other administrative examinations: Secondary | ICD-10-CM

## 2013-10-15 NOTE — Telephone Encounter (Signed)
Opened in error

## 2013-10-29 ENCOUNTER — Other Ambulatory Visit: Payer: Self-pay | Admitting: Family

## 2013-10-29 ENCOUNTER — Encounter: Payer: Self-pay | Admitting: Family

## 2013-10-29 ENCOUNTER — Ambulatory Visit (INDEPENDENT_AMBULATORY_CARE_PROVIDER_SITE_OTHER): Payer: BC Managed Care – PPO | Admitting: Family

## 2013-10-29 VITALS — BP 102/70 | HR 48 | Temp 97.5°F | Resp 16 | Ht 64.0 in | Wt 187.0 lb

## 2013-10-29 DIAGNOSIS — E785 Hyperlipidemia, unspecified: Secondary | ICD-10-CM

## 2013-10-29 DIAGNOSIS — I10 Essential (primary) hypertension: Secondary | ICD-10-CM

## 2013-10-29 DIAGNOSIS — E119 Type 2 diabetes mellitus without complications: Secondary | ICD-10-CM

## 2013-10-29 LAB — HEMOGLOBIN A1C
Hgb A1c MFr Bld: 7.2 % — ABNORMAL HIGH (ref <5.7)
Mean Plasma Glucose: 160 mg/dL — ABNORMAL HIGH (ref <117)

## 2013-10-29 MED ORDER — LABETALOL HCL 200 MG PO TABS: 200.0000 mg | ORAL_TABLET | Freq: Two times a day (BID) | ORAL | Status: DC

## 2013-10-29 MED ORDER — OMEPRAZOLE-SODIUM BICARBONATE 20-1100 MG PO CAPS: ORAL_CAPSULE | ORAL | Status: DC

## 2013-10-29 MED ORDER — PRAVASTATIN SODIUM 80 MG PO TABS: 80.0000 mg | ORAL_TABLET | Freq: Every day | ORAL | Status: DC

## 2013-10-29 MED ORDER — DILTIAZEM HCL ER COATED BEADS 240 MG PO CP24: ORAL_CAPSULE | ORAL | Status: DC

## 2013-10-29 NOTE — Patient Instructions (Signed)
Decrease labetalol to 200mg  twice daily. Complete lab work prior to leaving. Follow up in 1 month.

## 2013-10-29 NOTE — Progress Notes (Signed)
Pre visit review using our clinic review tool, if applicable. No additional management support is needed unless otherwise documented below in the visit note. 

## 2013-10-29 NOTE — Progress Notes (Signed)
Subjective:    Patient ID: Keith Hardin, male    DOB: 01/19/1951, 63 y.o.   MRN: [id number]  HPI  Diabetes history. Type II. Pt is on invokana for diabetes. Reports no adverse side effects. Pt checks his bs one  Time a week. Last check bs was 107. No hyperglycemic symptoms. No foot lesions. Pt states last saw eye doctor year past fall. No concerns on his feet. Pt walks a lot at work.   Htn- Pt does not check his blood pressure. No Cardiac or neurologic symptoms reported.  Pt is on fish oil and pravastatin. No side effects.  Last tryglyceride was 200. hdl was 34. Other markers  wereok.  Past Medical History  Diagnosis Date  . Allergy   . Diabetes mellitus     type II  . Hyperlipidemia   . Colon polyps   . Orchitis and epididymitis 01/2008    left with left hydrocele  . Hypogonadism male 05/02/2011  . Hypertension     History   Social History  . Marital Status: Married    Spouse Name: [name]    Number of Children: 0  . Years of Education: N/A   Occupational History  . Unemployed    Social History Main Topics  . Smoking status: Never Smoker   . Smokeless tobacco: Never Used  . Alcohol Use: No  . Drug Use: No  . Sexual Activity: Not on file   Other Topics Concern  . Not on file   Social History Narrative   Occupation: unemployed   Married since 1999   No children     Past Surgical History  Procedure Laterality Date  . Achilles tendon repair      right    Family History  Problem Relation Age of Onset  . Seizures Mother   . Hypertension Mother   . Cancer Brother     stomach  . Colon cancer Neg Hx   . Diabetes Neg Hx   . Heart attack Neg Hx   . Hyperlipidemia Neg Hx   . Sudden death Neg Hx     No Known Allergies  Current Outpatient Prescriptions on File Prior to Visit  Medication Sig Dispense Refill  . ACCU-CHEK AVIVA test strip Use to test blood sugar three times daily.  50 each  1  . aspirin 81 MG tablet Take 81 mg by mouth daily.        .  cloNIDine (CATAPRES) 0.3 MG tablet TAKE 1 TABLET BY MOUTH TWICE DAILY  60 tablet  4  . fexofenadine (ALLEGRA) 180 MG tablet Take 180 mg by mouth daily.      . fluticasone (FLONASE) 50 MCG/ACT nasal spray Place 2 sprays into both nostrils daily.      . hydrochlorothiazide (HYDRODIURIL) 25 MG tablet TAKE 1 TABLET BY MOUTH EVERY DAY  30 tablet  3  . INVOKANA 100 MG TABS Take 100 mg by mouth daily.      . Lancets (ACCU-CHEK MULTICLIX) lancets Use as directed 3 times daily to check blood sugar  102 each  2  . lisinopril (PRINIVIL,ZESTRIL) 40 MG tablet TAKE 1 TABLET BY MOUTH EVERY DAY  30 tablet  2  . Omega-3 Fatty Acids (FISH OIL) 1000 MG CAPS Take 1 capsule by mouth 2 (two) times daily.      . sitaGLIPtan-metformin (JANUMET) 50-1000 MG per tablet Take 1 tablet by mouth 2 (two) times daily with a meal.       No current facility-administered  medications on file prior to visit.    BP 102/70  Pulse 48  Temp(Src) 97.5 F (36.4 C) (Oral)  Resp 16  Ht 5\' 4"  (1.626 m)  Wt 187 lb 0.6 oz (84.841 kg)  BMI 32.09 kg/m2  SpO2 99%    Review of Systems  Constitutional: Negative for fever, chills and fatigue.  Respiratory: Negative for chest tightness and shortness of breath.   Cardiovascular: Negative for chest pain and leg swelling.  Gastrointestinal: Negative for nausea, abdominal pain and abdominal distention.  Endocrine: Negative for polydipsia, polyphagia and polyuria.  Skin:       No breakdown of feet. No concerns per pt.  Neurological: Negative for dizziness, syncope, weakness, light-headedness, numbness and headaches.       Objective:   Physical Exam  General Mental Status- Alert. General Appearance- Not in acute distress.   Skin General: Color- Normal Color. Moisture- Normal Moisture. No breakdown or lesions of his feet. Some missing toenails great toes(from nail excisions years ago).  Neck Carotid Arteries- Normal color. Moisture- Normal Moisture. No carotid bruits. No  JVD.  Chest and Lung Exam Auscultation: Breath Sounds:-Normal.  Cardiovascular Auscultation:Rythm- Regular. Murmurs & Other Heart Sounds:Auscultation of the heart reveals- No Murmurs.   Peripheral Vascular Lower Extremity:  Palpation: Dorsalis pedis pulse- Bilateral- Normal. Edema- Bilateral- No edema.  Neurologic Cranial Nerve exam:- CN III-XII intact(No nystagmus), symmetric smile. Strength- upper and lower extremity- symmetric strength both upper and lower extremities.         Assessment & Plan:  Patient seen along with Evern Core PA-C who is currently in orientation for training purposes. I have personally examined pt and agree with assessment and plan.     1. Htn- Lower side bp today with pulse on lower side. Pt does not check his bp or pulse daily.(But will adjust med since both bp and pulse low) Refill meds today. Decease labetolol to 200 mg bid. 2.Diabetes- Historically just little below last a1-c was 6.9. Will get a1-c today and make adjustments accordingly 3. Hyperlipidemia- Triglycerides have been elevated. 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 Adjust meds after labs done today.

## 2013-10-30 ENCOUNTER — Telehealth: Payer: Self-pay | Admitting: Family

## 2013-10-30 LAB — HEPATIC FUNCTION PANEL
ALT: 19 U/L (ref 0–53)
AST: 19 U/L (ref 0–37)
Albumin: 4.4 g/dL (ref 3.5–5.2)
Alkaline Phosphatase: 48 U/L (ref 39–117)
BILIRUBIN DIRECT: 0.1 mg/dL (ref 0.0–0.3)
BILIRUBIN INDIRECT: 0.3 mg/dL (ref 0.2–1.2)
Total Bilirubin: 0.4 mg/dL (ref 0.2–1.2)
Total Protein: 6.9 g/dL (ref 6.0–8.3)

## 2013-10-30 LAB — BASIC METABOLIC PANEL
BUN: 21 mg/dL (ref 6–23)
CALCIUM: 10.1 mg/dL (ref 8.4–10.5)
CO2: 25 meq/L (ref 19–32)
Chloride: 105 mEq/L (ref 96–112)
Creat: 1.19 mg/dL (ref 0.50–1.35)
GLUCOSE: 132 mg/dL — AB (ref 70–99)
Potassium: 3.8 mEq/L (ref 3.5–5.3)
Sodium: 144 mEq/L (ref 135–145)

## 2013-10-30 LAB — LIPID PANEL
CHOLESTEROL: 175 mg/dL (ref 0–200)
HDL: 50 mg/dL (ref >39)
LDL CALC: 99 mg/dL (ref 0–99)
Total CHOL/HDL Ratio: 3.5 Ratio
Triglycerides: 130 mg/dL (ref <150)
VLDL: 26 mg/dL (ref 0–40)

## 2013-10-30 LAB — MICROALBUMIN, URINE: Microalb, Ur: 0.5 mg/dL (ref 0.00–1.89)

## 2013-10-30 MED ORDER — PIOGLITAZONE HCL 30 MG PO TABS: 30.0000 mg | ORAL_TABLET | Freq: Every day | ORAL | Status: DC

## 2013-10-30 NOTE — Telephone Encounter (Signed)
Cholesterol, liver function look great. Sugar slightly above goal. Add Actos once daily.

## 2013-10-31 NOTE — Telephone Encounter (Signed)
Notified pt and he voices understanding. Pt thinks he was on this in the past and it was too costly. Advised pt to call us back if med is too expensive. Pt voices understanding.

## 2013-10-31 NOTE — Telephone Encounter (Signed)
Left message to return my call.  

## 2013-11-02 ENCOUNTER — Telehealth: Payer: Self-pay | Admitting: Family

## 2013-11-02 MED ORDER — LABETALOL HCL 200 MG PO TABS: ORAL_TABLET | ORAL | Status: DC

## 2013-11-02 MED ORDER — PRAVASTATIN SODIUM 80 MG PO TABS: 80.0000 mg | ORAL_TABLET | Freq: Every day | ORAL | Status: DC

## 2013-11-02 NOTE — Telephone Encounter (Signed)
Patient states that labetalol and pravastatin were supposed to go to [facility] [address], not [facility]

## 2013-11-26 ENCOUNTER — Ambulatory Visit (INDEPENDENT_AMBULATORY_CARE_PROVIDER_SITE_OTHER): Payer: BC Managed Care – PPO | Admitting: Family

## 2013-11-26 ENCOUNTER — Encounter: Payer: Self-pay | Admitting: Family

## 2013-11-26 VITALS — BP 136/76 | HR 55 | Temp 97.9°F | Resp 18 | Ht 64.0 in | Wt 193.1 lb

## 2013-11-26 DIAGNOSIS — I10 Essential (primary) hypertension: Secondary | ICD-10-CM

## 2013-11-26 NOTE — Progress Notes (Signed)
Pre visit review using our clinic review tool, if applicable. No additional management support is needed unless otherwise documented below in the visit note. 

## 2013-11-26 NOTE — Patient Instructions (Addendum)
Continue current dose of labetalol. Please follow up in 3 months.

## 2013-11-26 NOTE — Assessment & Plan Note (Signed)
BP stable.  Mild asymptomatic bradycardia. Continue current dose of labetalol.

## 2013-11-26 NOTE — Progress Notes (Signed)
Subjective:    Patient ID: Keith Hardin, male    DOB: 08/27/1950, 63 y.o.   MRN: [id number]  HPI  Mr. Keith Hardin is a 63 yr old male who presents today for 1 month follow up of his blood pressure. Last visit BP and HR was noted to be low.  Labetalol dose was decreased.    BP Readings from Last 3 Encounters:  11/26/13 136/76  10/29/13 102/70  07/20/13 118/64  HR is 55  Denies CP/SOB or swelling.     Review of Systems See HPI  Past Medical History  Diagnosis Date  . Allergy   . Diabetes mellitus     type II  . Hyperlipidemia   . Colon polyps   . Orchitis and epididymitis 01/2008    left with left hydrocele  . Hypogonadism male 05/02/2011  . Hypertension     History   Social History  . Marital Status: Married    Spouse Name: [name]    Number of Children: 0  . Years of Education: N/A   Occupational History  . Unemployed    Social History Main Topics  . Smoking status: Never Smoker   . Smokeless tobacco: Never Used  . Alcohol Use: No  . Drug Use: No  . Sexual Activity: Not on file   Other Topics Concern  . Not on file   Social History Narrative   Occupation: unemployed   Married since 1999   No children     Past Surgical History  Procedure Laterality Date  . Achilles tendon repair      right    Family History  Problem Relation Age of Onset  . Seizures Mother   . Hypertension Mother   . Cancer Brother     stomach  . Colon cancer Neg Hx   . Diabetes Neg Hx   . Heart attack Neg Hx   . Hyperlipidemia Neg Hx   . Sudden death Neg Hx     No Known Allergies  Current Outpatient Prescriptions on File Prior to Visit  Medication Sig Dispense Refill  . ACCU-CHEK AVIVA test strip Use to test blood sugar three times daily.  50 each  1  . aspirin 81 MG tablet Take 81 mg by mouth daily.        . cloNIDine (CATAPRES) 0.3 MG tablet TAKE 1 TABLET BY MOUTH TWICE DAILY  60 tablet  4  . diltiazem (CARTIA XT) 240 MG 24 hr capsule TAKE ONE CAPSULE BY MOUTH  EVERY DAY  30 capsule  5  . fexofenadine (ALLEGRA) 180 MG tablet Take 180 mg by mouth daily.      . fluticasone (FLONASE) 50 MCG/ACT nasal spray Place 2 sprays into both nostrils daily.      . hydrochlorothiazide (HYDRODIURIL) 25 MG tablet TAKE 1 TABLET BY MOUTH EVERY DAY  30 tablet  3  . INVOKANA 100 MG TABS Take 100 mg by mouth daily.      Marland Kitchen labetalol (NORMODYNE) 200 MG tablet TAKE 1 TABLET BY MOUTH TWICE DAILY  180 tablet  0  . Lancets (ACCU-CHEK MULTICLIX) lancets Use as directed 3 times daily to check blood sugar  102 each  2  . lisinopril (PRINIVIL,ZESTRIL) 40 MG tablet TAKE 1 TABLET BY MOUTH EVERY DAY  30 tablet  2  . Omega-3 Fatty Acids (FISH OIL) 1000 MG CAPS Take 1 capsule by mouth 2 (two) times daily.      Earney Navy Bicarbonate (ZEGERID OTC) 20-1100 MG CAPS capsule TAKE  ONE CAPSULE BY MOUTH EVERY DAY BEFORE BREAKFAST  30 capsule  5  . pioglitazone (ACTOS) 30 MG tablet Take 1 tablet (30 mg total) by mouth daily.  30 tablet  2  . pravastatin (PRAVACHOL) 80 MG tablet Take 1 tablet (80 mg total) by mouth daily.  30 tablet  5  . sitaGLIPtan-metformin (JANUMET) 50-1000 MG per tablet Take 1 tablet by mouth 2 (two) times daily with a meal.       No current facility-administered medications on file prior to visit.    BP 136/76  Pulse 55  Temp(Src) 97.9 F (36.6 C) (Oral)  Resp 18  Ht 5\' 4"  (1.626 m)  Wt 193 lb 1.3 oz (87.581 kg)  BMI 33.13 kg/m2  SpO2 99%       Objective:   Physical Exam  Constitutional: He is oriented to person, place, and time. He appears well-developed and well-nourished. No distress.  HENT:  Head: Normocephalic and atraumatic.  Cardiovascular: Normal rate and regular rhythm.   No murmur heard. Pulmonary/Chest: Effort normal and breath sounds normal. No respiratory distress. He has no wheezes. He has no rales. He exhibits no tenderness.  Neurological: He is alert and oriented to person, place, and time.  Psychiatric: He has a normal mood and  affect. His behavior is normal. Judgment and thought content normal.          Assessment & Plan:

## 2013-11-30 ENCOUNTER — Other Ambulatory Visit: Payer: Self-pay | Admitting: Family

## 2013-12-31 ENCOUNTER — Other Ambulatory Visit: Payer: Self-pay | Admitting: Family

## 2014-01-25 ENCOUNTER — Other Ambulatory Visit: Payer: Self-pay | Admitting: Family

## 2014-01-25 NOTE — Telephone Encounter (Signed)
Rx request to pharmacy/SLS  

## 2014-01-28 NOTE — Telephone Encounter (Signed)
Medication Detail      Disp Refills Start End     pioglitazone (ACTOS) 30 MG tablet 30 tablet 0 01/25/2014     Sig: TAKE 1 TABLET BY MOUTH EVERY DAY    E-Prescribing Status: Receipt confirmed by pharmacy (01/25/2014 12:03 PM EDT)    Pharmacy    [id number] - [place], [place] - [address]   Rx request to pharmacy/SLS

## 2014-02-03 ENCOUNTER — Other Ambulatory Visit: Payer: Self-pay | Admitting: Family

## 2014-02-04 ENCOUNTER — Other Ambulatory Visit: Payer: Self-pay | Admitting: Family

## 2014-02-04 NOTE — Telephone Encounter (Signed)
Medication Detail      Disp Refills Start End     hydrochlorothiazide (HYDRODIURIL) 25 MG tablet 30 tablet 0 02/04/2014     Sig: TAKE 1 TABLET BY MOUTH EVERY DAY    E-Prescribing Status: Receipt confirmed by pharmacy (02/04/2014 5:50 PM EDT)    [id number] - [place], [place] - [address] AT [address]   90-day request to pharmacy/SLS

## 2014-02-04 NOTE — Telephone Encounter (Signed)
Rx request to pharmacy/SLS  

## 2014-02-27 ENCOUNTER — Other Ambulatory Visit: Payer: Self-pay | Admitting: Family

## 2014-02-28 ENCOUNTER — Other Ambulatory Visit: Payer: Self-pay | Admitting: Family

## 2014-03-02 ENCOUNTER — Other Ambulatory Visit: Payer: Self-pay | Admitting: Family

## 2014-03-04 ENCOUNTER — Encounter: Payer: Self-pay | Admitting: Family

## 2014-03-04 ENCOUNTER — Ambulatory Visit: Payer: BC Managed Care – PPO | Admitting: Family

## 2014-03-04 ENCOUNTER — Ambulatory Visit (INDEPENDENT_AMBULATORY_CARE_PROVIDER_SITE_OTHER): Payer: BC Managed Care – PPO | Admitting: Family

## 2014-03-04 VITALS — BP 128/88 | HR 48 | Temp 97.5°F | Resp 18 | Ht 64.0 in | Wt 200.2 lb

## 2014-03-04 DIAGNOSIS — E119 Type 2 diabetes mellitus without complications: Secondary | ICD-10-CM

## 2014-03-04 DIAGNOSIS — I10 Essential (primary) hypertension: Secondary | ICD-10-CM

## 2014-03-04 DIAGNOSIS — E785 Hyperlipidemia, unspecified: Secondary | ICD-10-CM

## 2014-03-04 LAB — BASIC METABOLIC PANEL
BUN: 18 mg/dL (ref 6–23)
CO2: 26 mEq/L (ref 19–32)
Calcium: 9.1 mg/dL (ref 8.4–10.5)
Chloride: 105 mEq/L (ref 96–112)
Creatinine, Ser: 1.2 mg/dL (ref 0.4–1.5)
GFR: 80.15 mL/min (ref >60.00)
GLUCOSE: 149 mg/dL — AB (ref 70–99)
POTASSIUM: 3.7 meq/L (ref 3.5–5.1)
SODIUM: 142 meq/L (ref 135–145)

## 2014-03-04 LAB — HEMOGLOBIN A1C: Hgb A1c MFr Bld: 6.5 % (ref 4.6–6.5)

## 2014-03-04 MED ORDER — PRAVASTATIN SODIUM 80 MG PO TABS: 80.0000 mg | ORAL_TABLET | Freq: Every day | ORAL | Status: DC

## 2014-03-04 MED ORDER — LABETALOL HCL 200 MG PO TABS: ORAL_TABLET | ORAL | Status: DC

## 2014-03-04 MED ORDER — HYDROCHLOROTHIAZIDE 25 MG PO TABS: ORAL_TABLET | ORAL | Status: DC

## 2014-03-04 MED ORDER — DILTIAZEM HCL ER COATED BEADS 240 MG PO CP24: ORAL_CAPSULE | ORAL | Status: DC

## 2014-03-04 MED ORDER — LISINOPRIL 40 MG PO TABS: ORAL_TABLET | ORAL | Status: DC

## 2014-03-04 NOTE — Patient Instructions (Signed)
Please complete lab work prior to leaving. Schedule a fasting physical at the front desk for a time that is convenient.

## 2014-03-04 NOTE — Assessment & Plan Note (Signed)
Expect A1C to be at goal after addition of actos. Obtain follow up A1C.

## 2014-03-04 NOTE — Assessment & Plan Note (Signed)
BP stable on current meds. Continue same.  

## 2014-03-04 NOTE — Assessment & Plan Note (Signed)
LDL at goal.  Continue statin.   

## 2014-03-04 NOTE — Progress Notes (Signed)
Subjective:    Patient ID: Keith Hardin, male    DOB: August 21, 1950, 63 y.o.   MRN: [id number]  HPI  Keith Hardin is a 63 yr old male who presents today for follow up.  1) HTN-  Pt is maintained on labetalol, clonidine, lisinopril, labetalol. He denies CP or SOB.  BP Readings from Last 3 Encounters:  03/04/14 128/88  11/26/13 136/76  10/29/13 102/70   2) DM2-  Last visit A1C was slightly above goal and actos was added to his regimen. Reports that his sugars at home were around 112-120. He is scheduled for an eye exam next month.  Reports that he had flu shot at work.    Lab Results  Component Value Date   HGBA1C 7.2* 10/29/2013   HGBA1C 6.9* 07/02/2013   HGBA1C 6.9* 04/06/2013   Lab Results  Component Value Date   MICROALBUR 0.50 10/29/2013   LDLCALC 99 10/29/2013   CREATININE 1.19 10/29/2013   3) Hyperlipidemia- Last LDL 99. Denies myalgia on pravastatin.  Review of Systems See HPI  Past Medical History  Diagnosis Date  . Allergy   . Diabetes mellitus     type II  . Hyperlipidemia   . Colon polyps   . Orchitis and epididymitis 01/2008    left with left hydrocele  . Hypogonadism male 05/02/2011  . Hypertension     History   Social History  . Marital Status: Married    Spouse Name: [name]    Number of Children: 0  . Years of Education: N/A   Occupational History  . Unemployed    Social History Main Topics  . Smoking status: Never Smoker   . Smokeless tobacco: Never Used  . Alcohol Use: No  . Drug Use: No  . Sexual Activity: Not on file   Other Topics Concern  . Not on file   Social History Narrative   Occupation: unemployed   Married since 1999   No children     Past Surgical History  Procedure Laterality Date  . Achilles tendon repair      right    Family History  Problem Relation Age of Onset  . Seizures Mother   . Hypertension Mother   . Cancer Brother     stomach  . Colon cancer Neg Hx   . Diabetes Neg Hx   . Heart attack Neg Hx    . Hyperlipidemia Neg Hx   . Sudden death Neg Hx     No Known Allergies  Current Outpatient Prescriptions on File Prior to Visit  Medication Sig Dispense Refill  . ACCU-CHEK AVIVA test strip Use to test blood sugar three times daily. 50 each 1  . aspirin 81 MG tablet Take 81 mg by mouth daily.      . cloNIDine (CATAPRES) 0.3 MG tablet TAKE 1 TABLET BY MOUTH TWICE DAILY 60 tablet 1  . diltiazem (CARTIA XT) 240 MG 24 hr capsule TAKE ONE CAPSULE BY MOUTH EVERY DAY 30 capsule 5  . fexofenadine (ALLEGRA) 180 MG tablet Take 180 mg by mouth daily.    . fluticasone (FLONASE) 50 MCG/ACT nasal spray Place 2 sprays into both nostrils daily.    . hydrochlorothiazide (HYDRODIURIL) 25 MG tablet TAKE 1 TABLET BY MOUTH DAILY 90 tablet 0  . INVOKANA 100 MG TABS Take 100 mg by mouth daily.    Marland Kitchen labetalol (NORMODYNE) 200 MG tablet TAKE ONE TABLET BY MOUTH TWICE DAILY  60 tablet 2  . Lancets (ACCU-CHEK MULTICLIX) lancets  Use as directed 3 times daily to check blood sugar 102 each 2  . lisinopril (PRINIVIL,ZESTRIL) 40 MG tablet TAKE 1 TABLET BY MOUTH EVERY DAY 30 tablet 2  . Omega-3 Fatty Acids (FISH OIL) 1000 MG CAPS Take 1 capsule by mouth 2 (two) times daily.    Earney Navy Bicarbonate (ZEGERID OTC) 20-1100 MG CAPS capsule TAKE ONE CAPSULE BY MOUTH EVERY DAY BEFORE BREAKFAST 30 capsule 5  . pioglitazone (ACTOS) 30 MG tablet TAKE 1 TABLET BY MOUTH EVERY DAY 90 tablet 0  . pravastatin (PRAVACHOL) 80 MG tablet Take 1 tablet (80 mg total) by mouth daily. 30 tablet 5  . sitaGLIPtan-metformin (JANUMET) 50-1000 MG per tablet Take 1 tablet by mouth 2 (two) times daily with a meal.     No current facility-administered medications on file prior to visit.    BP 128/88 mmHg  Pulse 48  Temp(Src) 97.5 F (36.4 C) (Oral)  Resp 18  Ht 5\' 4"  (1.626 m)  Wt 200 lb 3.2 oz (90.81 kg)  BMI 34.35 kg/m2  SpO2 99%       Objective:   Physical Exam  Constitutional: He is oriented to person, place, and time.  He appears well-developed and well-nourished.  HENT:  Head: Normocephalic and atraumatic.  Cardiovascular: Normal rate and regular rhythm.   Murmur heard. Pulses:      Dorsalis pedis pulses are 2+ on the right side, and 2+ on the left side.       Posterior tibial pulses are 2+ on the right side, and 2+ on the left side.  Pulmonary/Chest: Effort normal and breath sounds normal.  Musculoskeletal: He exhibits no edema.  Neurological: He is alert and oriented to person, place, and time.  Skin: Skin is warm and dry.  Psychiatric: He has a normal mood and affect. His behavior is normal. Judgment and thought content normal.          Assessment & Plan:  Addendum: will decrease cartia xt from 240mg  to 180mg  due to asymptomatic bradycardia. See phone note.

## 2014-03-04 NOTE — Progress Notes (Signed)
Pre visit review using our clinic review tool, if applicable. No additional management support is needed unless otherwise documented below in the visit note. 

## 2014-03-07 ENCOUNTER — Telehealth: Payer: Self-pay | Admitting: Family

## 2014-03-07 MED ORDER — DILTIAZEM HCL ER COATED BEADS 180 MG PO CP24: 180.0000 mg | ORAL_CAPSULE | Freq: Every day | ORAL | Status: DC

## 2014-03-07 NOTE — Telephone Encounter (Signed)
Please let pt know that I reviewed his last several heart rates at his visits and they have been consistently low.  I would like to decrease his diltiazem dose from 240mg  to 180 mg.  We will repeat at his upcoming apt. In December.

## 2014-03-08 NOTE — Telephone Encounter (Signed)
Notified pt and he voices understanding. 

## 2014-04-04 ENCOUNTER — Other Ambulatory Visit: Payer: Self-pay | Admitting: Family

## 2014-04-05 MED ORDER — DILTIAZEM HCL ER COATED BEADS 180 MG PO CP24: 180.0000 mg | ORAL_CAPSULE | Freq: Every day | ORAL | Status: DC

## 2014-04-05 NOTE — Telephone Encounter (Signed)
Refill request received from pharmacy for cardizem CD and current list states cardizem XT. Is this the same?

## 2014-04-12 ENCOUNTER — Ambulatory Visit (INDEPENDENT_AMBULATORY_CARE_PROVIDER_SITE_OTHER): Payer: BC Managed Care – PPO | Admitting: Family

## 2014-04-12 ENCOUNTER — Encounter: Payer: Self-pay | Admitting: Family

## 2014-04-12 ENCOUNTER — Other Ambulatory Visit: Payer: Self-pay | Admitting: Family

## 2014-04-12 VITALS — BP 110/71 | HR 51 | Temp 97.6°F | Wt 201.0 lb

## 2014-04-12 DIAGNOSIS — Z0000 Encounter for general adult medical examination without abnormal findings: Secondary | ICD-10-CM

## 2014-04-12 LAB — URINALYSIS, ROUTINE W REFLEX MICROSCOPIC
BILIRUBIN URINE: NEGATIVE
Hgb urine dipstick: NEGATIVE
KETONES UR: NEGATIVE
LEUKOCYTES UA: NEGATIVE
Nitrite: NEGATIVE
PH: 6 (ref 5.0–8.0)
Specific Gravity, Urine: 1.01 (ref 1.000–1.030)
Total Protein, Urine: NEGATIVE
Urine Glucose: >=1000 — AB
Urobilinogen, UA: 0.2 (ref 0.0–1.0)

## 2014-04-12 LAB — CBC WITH DIFFERENTIAL/PLATELET
BASOS PCT: 0.6 % (ref 0.0–3.0)
Basophils Absolute: 0 10*3/uL (ref 0.0–0.1)
Eosinophils Absolute: 0.2 10*3/uL (ref 0.0–0.7)
Eosinophils Relative: 3.2 % (ref 0.0–5.0)
HEMATOCRIT: 38.2 % — AB (ref 39.0–52.0)
HEMOGLOBIN: 12.3 g/dL — AB (ref 13.0–17.0)
LYMPHS PCT: 25.3 % (ref 12.0–46.0)
Lymphs Abs: 1.3 10*3/uL (ref 0.7–4.0)
MCHC: 32.3 g/dL (ref 30.0–36.0)
MCV: 79.8 fl (ref 78.0–100.0)
MONOS PCT: 7.8 % (ref 3.0–12.0)
Monocytes Absolute: 0.4 10*3/uL (ref 0.1–1.0)
NEUTROS ABS: 3.1 10*3/uL (ref 1.4–7.7)
Neutrophils Relative %: 63.1 % (ref 43.0–77.0)
Platelets: 262 10*3/uL (ref 150.0–400.0)
RBC: 4.78 Mil/uL (ref 4.22–5.81)
RDW: 15.6 % — ABNORMAL HIGH (ref 11.5–15.5)
WBC: 4.9 10*3/uL (ref 4.0–10.5)

## 2014-04-12 NOTE — Progress Notes (Signed)
Subjective:    Patient ID: Keith Hardin, male    DOB: August 17, 1950, 63 y.o.   MRN: [id number]  HPI   Keith Hardin is a 63 yr old male who presents today for cpx.   Patient presents today for complete physical.  Immunizations: up to date Diet: reports fair diet with holidays Exercise: walks at work Colonoscopy: will be due in 2016 Dental:  Up to date Vision- up to date    Review of Systems  Constitutional: Negative for unexpected weight change.  HENT: Negative for hearing loss and rhinorrhea.   Eyes: Negative for visual disturbance.  Respiratory: Negative for cough and shortness of breath.   Cardiovascular: Negative for chest pain and leg swelling.  Gastrointestinal: Negative for nausea, vomiting, diarrhea and blood in stool.  Genitourinary: Negative for dysuria and frequency.  Musculoskeletal: Negative for myalgias and arthralgias.  Skin: Negative for rash.  Neurological: Negative for headaches.  Hematological: Negative for adenopathy.  Psychiatric/Behavioral:       Denies depression/anxiety   Past Medical History  Diagnosis Date  . Allergy   . Diabetes mellitus     type II  . Hyperlipidemia   . Colon polyps   . Orchitis and epididymitis 01/2008    left with left hydrocele  . Hypogonadism male 05/02/2011  . Hypertension     History   Social History  . Marital Status: Married    Spouse Name: [name]    Number of Children: 0  . Years of Education: N/A   Occupational History  . Unemployed    Social History Main Topics  . Smoking status: Never Smoker   . Smokeless tobacco: Never Used  . Alcohol Use: No  . Drug Use: No  . Sexual Activity: Not on file   Other Topics Concern  . Not on file   Social History Narrative   Occupation: unemployed   Married since 1999   No children     Past Surgical History  Procedure Laterality Date  . Achilles tendon repair      right    Family History  Problem Relation Age of Onset  . Seizures Mother   .  Hypertension Mother   . Cancer Brother     stomach  . Colon cancer Neg Hx   . Diabetes Neg Hx   . Heart attack Neg Hx   . Hyperlipidemia Neg Hx   . Sudden death Neg Hx     No Known Allergies  Current Outpatient Prescriptions on File Prior to Visit  Medication Sig Dispense Refill  . ACCU-CHEK AVIVA test strip Use to test blood sugar three times daily. 50 each 1  . aspirin 81 MG tablet Take 81 mg by mouth daily.      . cloNIDine (CATAPRES) 0.3 MG tablet TAKE 1 TABLET BY MOUTH TWICE DAILY 60 tablet 1  . diltiazem (CARDIZEM CD) 180 MG 24 hr capsule Take 1 capsule (180 mg total) by mouth daily. 30 capsule 3  . fexofenadine (ALLEGRA) 180 MG tablet Take 180 mg by mouth daily.    . fluticasone (FLONASE) 50 MCG/ACT nasal spray Place 2 sprays into both nostrils daily.    . hydrochlorothiazide (HYDRODIURIL) 25 MG tablet TAKE 1 TABLET BY MOUTH DAILY 90 tablet 1  . INVOKANA 100 MG TABS Take 100 mg by mouth daily.    Marland Kitchen labetalol (NORMODYNE) 200 MG tablet TAKE ONE TABLET BY MOUTH TWICE DAILY 180 tablet 1  . Lancets (ACCU-CHEK MULTICLIX) lancets Use as directed 3 times daily  to check blood sugar 102 each 2  . lisinopril (PRINIVIL,ZESTRIL) 40 MG tablet TAKE 1 TABLET BY MOUTH EVERY DAY 30 tablet 5  . Omega-3 Fatty Acids (FISH OIL) 1000 MG CAPS Take 1 capsule by mouth 2 (two) times daily.    Earney Navy Bicarbonate (ZEGERID OTC) 20-1100 MG CAPS capsule TAKE ONE CAPSULE BY MOUTH EVERY DAY BEFORE BREAKFAST 30 capsule 5  . pioglitazone (ACTOS) 30 MG tablet TAKE 1 TABLET BY MOUTH EVERY DAY 90 tablet 0  . pravastatin (PRAVACHOL) 80 MG tablet Take 1 tablet (80 mg total) by mouth daily. 90 tablet 1  . sitaGLIPtan-metformin (JANUMET) 50-1000 MG per tablet Take 1 tablet by mouth 2 (two) times daily with a meal.     No current facility-administered medications on file prior to visit.    BP 110/71 mmHg  Pulse 51  Temp(Src) 97.6 F (36.4 C) (Oral)  Wt 201 lb (91.173 kg)  SpO2 97%         Objective:   Physical Exam  Physical Exam  Constitutional: He is oriented to person, place, and time. He appears well-developed and well-nourished. No distress.  HENT:  Head: Normocephalic and atraumatic.  Right Ear: Tympanic membrane and ear canal normal.  Left Ear: Tympanic membrane and ear canal normal.  Mouth/Throat: Oropharynx is clear and moist.  Eyes: Pupils are equal, round, and reactive to light. No scleral icterus.  Neck: Normal range of motion. No thyromegaly present.  Cardiovascular: Normal rate and regular rhythm.   No murmur heard. Pulmonary/Chest: Effort normal and breath sounds normal. No respiratory distress. He has no wheezes. He has no rales. He exhibits no tenderness.  Abdominal: Soft. Bowel sounds are normal. He exhibits no distension and no mass. There is no tenderness. There is no rebound and no guarding.  Musculoskeletal: He exhibits no edema.  Lymphadenopathy:    He has no cervical adenopathy.  Neurological: He is alert and oriented to person, place, and time. He has normal patellar reflexes. He exhibits normal muscle tone. Coordination normal.  Skin: Skin is warm and dry.  Psychiatric: He has a normal mood and affect. His behavior is normal. Judgment and thought content normal.          Assessment & Plan:        Assessment & Plan:

## 2014-04-12 NOTE — Patient Instructions (Addendum)
Please check with your insurance re: [employer] coverage and call to schedule nurse visit. Complete lab work prior to leaving. Work on losing weight and getting 30 minutes of walking 5 days a week. Follow up in 3 months.

## 2014-04-12 NOTE — Progress Notes (Signed)
Pre visit review using our clinic review tool, if applicable. No additional management support is needed unless otherwise documented below in the visit note. 

## 2014-04-12 NOTE — Assessment & Plan Note (Signed)
Discussed healthy diet, exercise weight loss. Pt will check with his insurance re: zostavax.

## 2014-04-16 ENCOUNTER — Encounter: Payer: Self-pay | Admitting: Family

## 2014-04-16 ENCOUNTER — Other Ambulatory Visit: Payer: Self-pay | Admitting: Family

## 2014-04-16 LAB — PSA: PSA: 2.5 ng/mL (ref <=4.00)

## 2014-04-16 LAB — TSH: TSH: 1.377 u[IU]/mL (ref 0.350–4.500)

## 2014-05-02 ENCOUNTER — Other Ambulatory Visit: Payer: Self-pay | Admitting: Family

## 2014-05-03 ENCOUNTER — Ambulatory Visit (INDEPENDENT_AMBULATORY_CARE_PROVIDER_SITE_OTHER): Payer: BC Managed Care – PPO

## 2014-05-03 DIAGNOSIS — Z23 Encounter for immunization: Secondary | ICD-10-CM

## 2014-05-03 MED ORDER — ZOSTER VACCINE LIVE 19400 UNT/0.65ML ~~LOC~~ SOLR: 0.6500 mL | Freq: Once | SUBCUTANEOUS | Status: DC

## 2014-05-22 ENCOUNTER — Other Ambulatory Visit: Payer: Self-pay | Admitting: Family

## 2014-05-22 NOTE — Telephone Encounter (Signed)
Rx request to pharmacy/SLS  

## 2014-05-26 ENCOUNTER — Other Ambulatory Visit: Payer: Self-pay | Admitting: Family

## 2014-05-27 NOTE — Telephone Encounter (Signed)
Medication Detail      Disp Refills Start End     pioglitazone (ACTOS) 30 MG tablet 90 tablet 0 01/28/2014     Sig: TAKE 1 TABLET BY MOUTH EVERY DAY    Notes to Pharmacy: **Patient requests 90 days supply**    E-Prescribing Status: Receipt confirmed by pharmacy (01/28/2014 9:47 AM EDT)      Rx request to pharmacy/SLS

## 2014-07-10 ENCOUNTER — Encounter: Payer: Self-pay | Admitting: Gastroenterology

## 2014-07-15 ENCOUNTER — Encounter: Payer: Self-pay | Admitting: Family

## 2014-07-15 ENCOUNTER — Ambulatory Visit (INDEPENDENT_AMBULATORY_CARE_PROVIDER_SITE_OTHER): Payer: BC Managed Care – PPO | Admitting: Family

## 2014-07-15 VITALS — BP 133/83 | HR 54 | Temp 97.9°F | Resp 16 | Ht 64.0 in | Wt 203.8 lb

## 2014-07-15 DIAGNOSIS — E119 Type 2 diabetes mellitus without complications: Secondary | ICD-10-CM | POA: Diagnosis not present

## 2014-07-15 DIAGNOSIS — E785 Hyperlipidemia, unspecified: Secondary | ICD-10-CM | POA: Diagnosis not present

## 2014-07-15 DIAGNOSIS — I10 Essential (primary) hypertension: Secondary | ICD-10-CM

## 2014-07-15 MED ORDER — PRAVASTATIN SODIUM 80 MG PO TABS: 80.0000 mg | ORAL_TABLET | Freq: Every day | ORAL | Status: DC

## 2014-07-15 MED ORDER — DILTIAZEM HCL ER COATED BEADS 180 MG PO CP24: 180.0000 mg | ORAL_CAPSULE | Freq: Every day | ORAL | Status: DC

## 2014-07-15 MED ORDER — CLONIDINE HCL 0.3 MG PO TABS: 0.3000 mg | ORAL_TABLET | Freq: Two times a day (BID) | ORAL | Status: DC

## 2014-07-15 MED ORDER — LABETALOL HCL 200 MG PO TABS: 200.0000 mg | ORAL_TABLET | Freq: Two times a day (BID) | ORAL | Status: DC

## 2014-07-15 MED ORDER — LISINOPRIL 40 MG PO TABS: 40.0000 mg | ORAL_TABLET | Freq: Every day | ORAL | Status: DC

## 2014-07-15 MED ORDER — SITAGLIPTIN PHOS-METFORMIN HCL 50-1000 MG PO TABS: 1.0000 | ORAL_TABLET | Freq: Two times a day (BID) | ORAL | Status: DC

## 2014-07-15 MED ORDER — PIOGLITAZONE HCL 30 MG PO TABS: 30.0000 mg | ORAL_TABLET | Freq: Every day | ORAL | Status: DC

## 2014-07-15 NOTE — Patient Instructions (Signed)
Please complete lab work prior to leaving.   Please schedule a follow up appointment in 3 months.  

## 2014-07-15 NOTE — Progress Notes (Signed)
Pre visit review using our clinic review tool, if applicable. No additional management support is needed unless otherwise documented below in the visit note. 

## 2014-07-15 NOTE — Progress Notes (Signed)
Subjective:    Patient ID: Keith Hardin, male    DOB: 1950-12-27, 64 y.o.   MRN: [id number]  HPI  Patient presents today for follow up of multiple medical problems.  Diabetes Type 2  Pt is currently maintained on the following medications for diabetes:janumet, invokana  Wt Readings from Last 3 Encounters:  07/15/14 203 lb 12.8 oz (92.443 kg)  04/12/14 201 lb (91.173 kg)  03/04/14 200 lb 3.2 oz (90.81 kg)   Lab Results  Component Value Date   HGBA1C 6.5 03/04/2014   HGBA1C 7.2* 10/29/2013   HGBA1C 6.9* 07/02/2013    Lab Results  Component Value Date   MICROALBUR 0.50 10/29/2013   [place] 99 10/29/2013   CREATININE 1.2 03/04/2014    Last diabetic eye exam was: 11/15- normal per pt Lab Results  Component Value Date   HMDIABEYEEXA normal 02/11/2010   Denies polyuria/polydipsia. Denies hypoglycemia Home glucose readings range 112-120  Hyperlipidemia  Patient is currently maintained on the following medication for hyperlipidemia: pravastatin Last lipid panel as follows:  Lab Results  Component Value Date   CHOL 175 10/29/2013   HDL 50 10/29/2013   LDLCALC 99 10/29/2013   TRIG 130 10/29/2013   CHOLHDL 3.5 10/29/2013   Patient denies myalgia. Patient reports good compliance with low fat/low cholesterol diet.   Hypertension  Patient is currently maintained on the following medications for blood pressure: lisinopril, labetalol, hctz, clonidine, cardizem.  Patient reports good compliance with blood pressure medications. Patient denies chest pain, shortness of breath or swelling. Last 3 blood pressure readings in our office are as follows: BP Readings from Last 3 Encounters:  07/15/14 133/83  04/12/14 110/71  03/04/14 128/88          Review of Systems See HPI  Past Medical History  Diagnosis Date  . Allergy   . Diabetes mellitus     type II  . Hyperlipidemia   . Colon polyps   . Orchitis and epididymitis 01/2008    left with left hydrocele    . Hypogonadism male 05/02/2011  . Hypertension     History   Social History  . Marital Status: Married    Spouse Name: [name]  . Number of Children: 0  . Years of Education: N/A   Occupational History  . Unemployed    Social History Main Topics  . Smoking status: Never Smoker   . Smokeless tobacco: Never Used  . Alcohol Use: No  . Drug Use: No  . Sexual Activity: Not on file   Other Topics Concern  . Not on file   Social History Narrative   Occupation: unemployed   Married since 1999   No children     Past Surgical History  Procedure Laterality Date  . Achilles tendon repair      right    Family History  Problem Relation Age of Onset  . Seizures Mother   . Hypertension Mother   . Cancer Brother     stomach  . Colon cancer Neg Hx   . Diabetes Neg Hx   . Heart attack Neg Hx   . Hyperlipidemia Neg Hx   . Sudden death Neg Hx     No Known Allergies  Current Outpatient Prescriptions on File Prior to Visit  Medication Sig Dispense Refill  . ACCU-CHEK AVIVA test strip Use to test blood sugar three times daily. 50 each 1  . aspirin 81 MG tablet Take 81 mg by mouth daily.      Marland Kitchen  cloNIDine (CATAPRES) 0.3 MG tablet TAKE 1 TABLET BY MOUTH TWICE DAILY 180 tablet 1  . diltiazem (CARDIZEM CD) 180 MG 24 hr capsule Take 1 capsule (180 mg total) by mouth daily. 30 capsule 3  . fexofenadine (ALLEGRA) 180 MG tablet Take 180 mg by mouth daily.    . fluticasone (FLONASE) 50 MCG/ACT nasal spray Place 2 sprays into both nostrils daily.    . hydrochlorothiazide (HYDRODIURIL) 25 MG tablet TAKE 1 TABLET BY MOUTH DAILY 90 tablet 1  . labetalol (NORMODYNE) 200 MG tablet TAKE ONE TABLET BY MOUTH TWICE DAILY  60 tablet 1  . Lancets (ACCU-CHEK MULTICLIX) lancets Use as directed 3 times daily to check blood sugar 102 each 2  . lisinopril (PRINIVIL,ZESTRIL) 40 MG tablet TAKE 1 TABLET BY MOUTH EVERY DAY 30 tablet 5  . Omega-3 Fatty Acids (FISH OIL) 1000 MG CAPS Take 1 capsule by mouth 2  (two) times daily.    . pioglitazone (ACTOS) 30 MG tablet TAKE 1 TABLET BY MOUTH EVERY DAY 90 tablet 0  . pravastatin (PRAVACHOL) 80 MG tablet Take 1 tablet (80 mg total) by mouth daily. 90 tablet 1  . sitaGLIPtan-metformin (JANUMET) 50-1000 MG per tablet Take 1 tablet by mouth 2 (two) times daily with a meal.    . ZEGERID OTC 20-1100 MG CAPS capsule TAKE ONE CAPSULE BY MOUTH EVERY DAY BEFORE BREAKFAST 28 capsule 5   No current facility-administered medications on file prior to visit.    BP 133/83 mmHg  Pulse 54  Temp(Src) 97.9 F (36.6 C) (Oral)  Resp 16  Ht 5\' 4"  (1.626 m)  Wt 203 lb 12.8 oz (92.443 kg)  BMI 34.96 kg/m2  SpO2 98%       Objective:   Physical Exam  Constitutional: He is oriented to person, place, and time. He appears well-developed and well-nourished. No distress.  HENT:  Head: Normocephalic and atraumatic.  Cardiovascular: Normal rate and regular rhythm.   No murmur heard. Pulmonary/Chest: Effort normal and breath sounds normal. No respiratory distress. He has no wheezes. He has no rales.  Musculoskeletal: He exhibits no edema.  Neurological: He is alert and oriented to person, place, and time.  Skin: Skin is warm and dry.  Psychiatric: He has a normal mood and affect. His behavior is normal. Thought content normal.          Assessment & Plan:

## 2014-07-16 ENCOUNTER — Encounter: Payer: Self-pay | Admitting: Family

## 2014-07-16 LAB — BASIC METABOLIC PANEL
BUN: 21 mg/dL (ref 6–23)
CALCIUM: 9.5 mg/dL (ref 8.4–10.5)
CO2: 28 meq/L (ref 19–32)
Chloride: 105 mEq/L (ref 96–112)
Creatinine, Ser: 1.12 mg/dL (ref 0.40–1.50)
GFR: 85.03 mL/min (ref >60.00)
Glucose, Bld: 73 mg/dL (ref 70–99)
Potassium: 3.7 mEq/L (ref 3.5–5.1)
Sodium: 141 mEq/L (ref 135–145)

## 2014-07-16 LAB — HEMOGLOBIN A1C: Hgb A1c MFr Bld: 7 % — ABNORMAL HIGH (ref 4.6–6.5)

## 2014-07-20 NOTE — Assessment & Plan Note (Signed)
Lab Results  Component Value Date   HGBA1C 7.0* 07/15/2014   DM control is fair on current meds.  Continue same, re-inforced importance of diabetic diet.

## 2014-07-20 NOTE — Assessment & Plan Note (Signed)
BP stable on current meds, continue same.  

## 2014-07-20 NOTE — Assessment & Plan Note (Signed)
LDL at goal, continue statin. 

## 2014-07-24 ENCOUNTER — Other Ambulatory Visit: Payer: Self-pay | Admitting: Family

## 2014-07-25 ENCOUNTER — Other Ambulatory Visit: Payer: Self-pay | Admitting: Family

## 2014-08-02 ENCOUNTER — Other Ambulatory Visit: Payer: Self-pay | Admitting: Family

## 2014-08-05 ENCOUNTER — Other Ambulatory Visit: Payer: Self-pay | Admitting: Family

## 2014-08-06 MED ORDER — DILTIAZEM HCL ER COATED BEADS 180 MG PO CP24: 180.0000 mg | ORAL_CAPSULE | Freq: Every day | ORAL | Status: DC

## 2014-08-06 NOTE — Telephone Encounter (Signed)
CARTIA XT 240 MG 24 hr capsule [[id number]] DISCONTINUED      Dose, Route, Frequency: As Directed    Dispense Quantity:  30 capsule Refills:  0 Fills Remaining:  0          Sig: TAKE ONE CAPSULE BY MOUTH EVERY DAY         Discontinue Date:  03/04/2014 0932 Discontinue User:  Ronny Flurry, CMA Discontinue Reason:  Duplicate   Written Date:  11/30/13 Expiration Date:  11/30/14     Start Date:  11/30/13 End Date:  03/04/14     Ordering Provider:  Debbrah Alar, NP Authorizing Provider:  Debbrah Alar, NP Ordering User:  Izola Price, CMA         RX NOT ON ACTIVE MEDICATION LIST; LAST REFILL INFORMATION COPIED ABOVE/SLS Please Advise on refills.

## 2014-08-30 ENCOUNTER — Other Ambulatory Visit: Payer: Self-pay | Admitting: Family

## 2014-09-06 ENCOUNTER — Other Ambulatory Visit: Payer: Self-pay | Admitting: Family

## 2014-09-25 ENCOUNTER — Telehealth: Payer: Self-pay | Admitting: Family

## 2014-09-25 NOTE — Telephone Encounter (Signed)
error 

## 2014-09-27 ENCOUNTER — Other Ambulatory Visit: Payer: Self-pay | Admitting: Family

## 2014-10-04 LAB — HEMOGLOBIN A1C: Hgb A1c MFr Bld: 6.7 % — AB (ref 4.0–6.0)

## 2014-10-04 LAB — LIPID PANEL
Cholesterol: 152 mg/dL (ref 0–200)
HDL: 55 mg/dL (ref 35–70)
LDL CALC: 79 mg/dL
Triglycerides: 91 mg/dL (ref 40–160)

## 2014-10-04 LAB — PSA: PSA: 3.02

## 2014-10-21 ENCOUNTER — Ambulatory Visit (INDEPENDENT_AMBULATORY_CARE_PROVIDER_SITE_OTHER): Payer: BC Managed Care – PPO | Admitting: Family

## 2014-10-21 ENCOUNTER — Encounter: Payer: Self-pay | Admitting: Family

## 2014-10-21 ENCOUNTER — Ambulatory Visit: Payer: BC Managed Care – PPO | Admitting: Family

## 2014-10-21 VITALS — BP 112/70 | HR 56 | Temp 98.1°F | Ht 64.0 in | Wt 209.2 lb

## 2014-10-21 DIAGNOSIS — I10 Essential (primary) hypertension: Secondary | ICD-10-CM | POA: Diagnosis not present

## 2014-10-21 DIAGNOSIS — Z0000 Encounter for general adult medical examination without abnormal findings: Secondary | ICD-10-CM | POA: Diagnosis not present

## 2014-10-21 DIAGNOSIS — E785 Hyperlipidemia, unspecified: Secondary | ICD-10-CM | POA: Diagnosis not present

## 2014-10-21 DIAGNOSIS — E119 Type 2 diabetes mellitus without complications: Secondary | ICD-10-CM

## 2014-10-21 NOTE — Progress Notes (Signed)
Subjective:    Patient ID: Keith Hardin, male    DOB: [birth year], 64 y.o.   MRN: [id number]  HPI  Keith Hardin is a 64 yr old male who presents today for a 3 month follow up.    Patient presents today for follow up of multiple medical problems.  Diabetes Type 2  Pt is currently maintained on the following medications for diabetes:  actos and janumet  Lab Results  Component Value Date   HGBA1C 7.0* 07/15/2014   HGBA1C 6.5 03/04/2014   HGBA1C 7.2* 10/29/2013    Lab Results  Component Value Date   MICROALBUR 0.50 10/29/2013   [place] 99 10/29/2013   CREATININE 1.12 07/15/2014    Last diabetic eye exam was: 12/1 Denies polyuria/polydipsia. Denies hypoglycemia Home glucose readings range 112- 115  Hyperlipidemia  Patient is currently maintained on the following medication for hyperlipidemia: pravastatin Last lipid panel as follows:  Lab Results  Component Value Date   CHOL 175 10/29/2013   HDL 50 10/29/2013   LDLCALC 99 10/29/2013   TRIG 130 10/29/2013   CHOLHDL 3.5 10/29/2013   Patient denies myalgia. Patient reports good compliance with low fat/low cholesterol diet.   Hypertension  Patient is currently maintained on the following medications for blood pressure: diltiazem, labetalol, lisinopril,  Patient reports good compliance with blood pressure medications. Patient denies chest pain, shortness of breath or swelling. Last 3 blood pressure readings in our office are as follows: BP Readings from Last 3 Encounters:  10/21/14 112/70  07/15/14 133/83  04/12/14 110/71    Review of Systems    see HPI  Past Medical History  Diagnosis Date  . Allergy   . Diabetes mellitus     type II  . Hyperlipidemia   . Colon polyps   . Orchitis and epididymitis 01/2008    left with left hydrocele  . Hypogonadism male 05/02/2011  . Hypertension     History   Social History  . Marital Status: Married    Spouse Name: [name]  . Number of Children: 0  . Years of  Education: N/A   Occupational History  . Unemployed    Social History Main Topics  . Smoking status: Never Smoker   . Smokeless tobacco: Never Used  . Alcohol Use: No  . Drug Use: No  . Sexual Activity: Not on file   Other Topics Concern  . Not on file   Social History Narrative   Occupation: unemployed   Married since 1999   No children     Past Surgical History  Procedure Laterality Date  . Achilles tendon repair      right    Family History  Problem Relation Age of Onset  . Seizures Mother   . Hypertension Mother   . Cancer Brother     stomach  . Colon cancer Neg Hx   . Diabetes Neg Hx   . Heart attack Neg Hx   . Hyperlipidemia Neg Hx   . Sudden death Neg Hx     No Known Allergies  Current Outpatient Prescriptions on File Prior to Visit  Medication Sig Dispense Refill  . ACCU-CHEK AVIVA test strip Use to test blood sugar three times daily. 50 each 1  . aspirin 81 MG tablet Take 81 mg by mouth daily.      . bimatoprost (LUMIGAN) 0.03 % ophthalmic solution Place into both eyes daily.    . cloNIDine (CATAPRES) 0.3 MG tablet Take 1 tablet (0.3 mg total) by  mouth 2 (two) times daily. 180 tablet 1  . diltiazem (CARDIZEM CD) 180 MG 24 hr capsule Take 1 capsule (180 mg total) by mouth daily. 30 capsule 5  . fexofenadine (ALLEGRA) 180 MG tablet Take 180 mg by mouth daily.    . fluticasone (FLONASE) 50 MCG/ACT nasal spray Place 2 sprays into both nostrils daily.    . hydrochlorothiazide (HYDRODIURIL) 25 MG tablet TAKE 1 TABLET BY MOUTH EVERY DAY 90 tablet 0  . INVOKANA 300 MG TABS tablet Take 300 mg by mouth daily.  0  . labetalol (NORMODYNE) 200 MG tablet Take 1 tablet (200 mg total) by mouth 2 (two) times daily. 180 tablet 1  . Lancets (ACCU-CHEK MULTICLIX) lancets Use as directed 3 times daily to check blood sugar 102 each 2  . lisinopril (PRINIVIL,ZESTRIL) 40 MG tablet TAKE 1 TABLET BY MOUTH EVERY DAY 30 tablet 2  . Omega-3 Fatty Acids (FISH OIL) 1000 MG CAPS Take  1 capsule by mouth 2 (two) times daily.    . pioglitazone (ACTOS) 30 MG tablet Take 1 tablet (30 mg total) by mouth daily. 90 tablet 1  . pravastatin (PRAVACHOL) 80 MG tablet Take 1 tablet (80 mg total) by mouth daily. 90 tablet 1  . sitaGLIPtin-metformin (JANUMET) 50-1000 MG per tablet Take 1 tablet by mouth 2 (two) times daily with a meal. 180 tablet 1  . ZEGERID OTC 20-1100 MG CAPS capsule TAKE ONE CAPSULE BY MOUTH EVERY DAY BEFORE BREAKFAST 28 capsule 5   No current facility-administered medications on file prior to visit.    BP 112/70 mmHg  Pulse 56  Temp(Src) 98.1 F (36.7 C) (Oral)  Ht 5\' 4"  (1.626 m)  Wt 209 lb 4 oz (94.915 kg)  BMI 35.90 kg/m2  SpO2 94%    Objective:   Physical Exam  Constitutional: He is oriented to person, place, and time. He appears well-developed and well-nourished. No distress.  HENT:  Head: Normocephalic and atraumatic.  Cardiovascular: Normal rate and regular rhythm.   No murmur heard. Pulmonary/Chest: Effort normal and breath sounds normal. No respiratory distress. He has no wheezes. He has no rales.  Musculoskeletal: He exhibits no edema.  Neurological: He is alert and oriented to person, place, and time.  Skin: Skin is warm and dry.  Psychiatric: He has a normal mood and affect. His behavior is normal. Thought content normal.          Assessment & Plan:  Pt brings labs from [place] which are reviewed and sent for scanning.

## 2014-10-21 NOTE — Assessment & Plan Note (Signed)
BP stable on current meds. Continue same.  

## 2014-10-21 NOTE — Assessment & Plan Note (Signed)
LDL at goal per [facility] labs. Continue statin.

## 2014-10-21 NOTE — Assessment & Plan Note (Signed)
A1C at [facility] was 6.7. Continue current meds.

## 2014-10-21 NOTE — Patient Instructions (Signed)
Please complete lab work prior to leaving. Follow up in 3 months.  

## 2014-10-21 NOTE — Progress Notes (Signed)
Pre visit review using our clinic review tool, if applicable. No additional management support is needed unless otherwise documented below in the visit note. 

## 2014-10-22 LAB — MICROALBUMIN / CREATININE URINE RATIO
CREATININE, U: 54.5 mg/dL
MICROALB/CREAT RATIO: 1.3 mg/g (ref 0.0–30.0)
Microalb, Ur: <0.7 mg/dL (ref 0.0–1.9)

## 2014-10-26 ENCOUNTER — Other Ambulatory Visit: Payer: Self-pay | Admitting: Family

## 2014-11-24 ENCOUNTER — Other Ambulatory Visit: Payer: Self-pay | Admitting: Family

## 2015-01-01 ENCOUNTER — Encounter: Payer: Self-pay | Admitting: Gastroenterology

## 2015-01-03 ENCOUNTER — Telehealth: Payer: Self-pay | Admitting: Family

## 2015-01-03 DIAGNOSIS — Z0000 Encounter for general adult medical examination without abnormal findings: Secondary | ICD-10-CM

## 2015-01-03 NOTE — Telephone Encounter (Signed)
Caller name: [place] Relation to pt: self Call back number:[phone] Pharmacy:  Reason for call: Pt came in office stating needs a referral for Dr. Reece [facility] tel [phone], pt states it is time for him to have a Colonoscopy and was going to Dr. Ferdinand Lango office since pt has been seen there before. Please advise.

## 2015-01-20 ENCOUNTER — Other Ambulatory Visit: Payer: Self-pay | Admitting: Family

## 2015-01-20 ENCOUNTER — Encounter: Payer: Self-pay | Admitting: Family

## 2015-01-20 ENCOUNTER — Ambulatory Visit (INDEPENDENT_AMBULATORY_CARE_PROVIDER_SITE_OTHER): Payer: BC Managed Care – PPO | Admitting: Family

## 2015-01-20 VITALS — HR 53 | Temp 98.1°F | Resp 16 | Ht 64.0 in | Wt 207.6 lb

## 2015-01-20 DIAGNOSIS — E119 Type 2 diabetes mellitus without complications: Secondary | ICD-10-CM

## 2015-01-20 DIAGNOSIS — G4710 Hypersomnia, unspecified: Secondary | ICD-10-CM | POA: Diagnosis not present

## 2015-01-20 DIAGNOSIS — I10 Essential (primary) hypertension: Secondary | ICD-10-CM

## 2015-01-20 DIAGNOSIS — Z23 Encounter for immunization: Secondary | ICD-10-CM

## 2015-01-20 DIAGNOSIS — R400 Somnolence: Secondary | ICD-10-CM

## 2015-01-20 DIAGNOSIS — E785 Hyperlipidemia, unspecified: Secondary | ICD-10-CM

## 2015-01-20 MED ORDER — CLONIDINE HCL 0.3 MG PO TABS: 0.3000 mg | ORAL_TABLET | Freq: Two times a day (BID) | ORAL | Status: DC

## 2015-01-20 MED ORDER — DILTIAZEM HCL ER COATED BEADS 180 MG PO CP24: 180.0000 mg | ORAL_CAPSULE | Freq: Every day | ORAL | Status: DC

## 2015-01-20 MED ORDER — PIOGLITAZONE HCL 30 MG PO TABS: 30.0000 mg | ORAL_TABLET | Freq: Every day | ORAL | Status: DC

## 2015-01-20 MED ORDER — LISINOPRIL 40 MG PO TABS: 40.0000 mg | ORAL_TABLET | Freq: Every day | ORAL | Status: DC

## 2015-01-20 MED ORDER — HYDROCHLOROTHIAZIDE 25 MG PO TABS: 25.0000 mg | ORAL_TABLET | Freq: Every day | ORAL | Status: DC

## 2015-01-20 MED ORDER — PRAVASTATIN SODIUM 80 MG PO TABS: 80.0000 mg | ORAL_TABLET | Freq: Every day | ORAL | Status: DC

## 2015-01-20 MED ORDER — LABETALOL HCL 200 MG PO TABS: 200.0000 mg | ORAL_TABLET | Freq: Two times a day (BID) | ORAL | Status: DC

## 2015-01-20 NOTE — Progress Notes (Signed)
Subjective:    Patient ID: Keith Hardin, male    DOB: 01/07/1951, 64 y.o.   MRN: [id number]  HPI   Keith Hardin is a 64 yr old male who presents today for follow up.  1) HTN- Pt is maintained on clonidine, diltiazem, hctz, labetalol. Denies CP/SOB BP Readings from Last 3 Encounters:  10/21/14 112/70  07/15/14 133/83  04/12/14 110/71   2) DM2-  Lab Results  Component Value Date   HGBA1C 6.7* 10/04/2014   HGBA1C 7.0* 07/15/2014   HGBA1C 6.5 03/04/2014   Lab Results  Component Value Date   MICROALBUR <0.7 10/21/2014   LDLCALC 79 10/04/2014   CREATININE 1.12 07/15/2014   3) ? Sleep apnea- pt reports that his wife notes that he stops breathing during his sleep. Finds himself drows and can easily doze off if he is sitting or watching TV.    4) Hyperlipidemia- on statin, at goal.   Wt Readings from Last 3 Encounters:  01/20/15 207 lb 9.6 oz (94.167 kg)  10/21/14 209 lb 4 oz (94.915 kg)  07/15/14 203 lb 12.8 oz (92.443 kg)     Review of Systems See HPI  Past Medical History  Diagnosis Date  . Allergy   . Diabetes mellitus     type II  . Hyperlipidemia   . Colon polyps   . Orchitis and epididymitis 01/2008    left with left hydrocele  . Hypogonadism male 05/02/2011  . Hypertension     Social History   Social History  . Marital Status: Married    Spouse Name: [name]  . Number of Children: 0  . Years of Education: N/A   Occupational History  . Unemployed    Social History Main Topics  . Smoking status: Never Smoker   . Smokeless tobacco: Never Used  . Alcohol Use: No  . Drug Use: No  . Sexual Activity: Not on file   Other Topics Concern  . Not on file   Social History Narrative   Occupation: unemployed   Married since 1999   No children     Past Surgical History  Procedure Laterality Date  . Achilles tendon repair      right    Family History  Problem Relation Age of Onset  . Seizures Mother   . Hypertension Mother   . Cancer Brother      stomach  . Colon cancer Neg Hx   . Diabetes Neg Hx   . Heart attack Neg Hx   . Hyperlipidemia Neg Hx   . Sudden death Neg Hx     No Known Allergies  Current Outpatient Prescriptions on File Prior to Visit  Medication Sig Dispense Refill  . ACCU-CHEK AVIVA test strip Use to test blood sugar three times daily. 50 each 1  . aspirin 81 MG tablet Take 81 mg by mouth daily.      . bimatoprost (LUMIGAN) 0.03 % ophthalmic solution Place into both eyes daily.    . fexofenadine (ALLEGRA) 180 MG tablet Take 180 mg by mouth daily.    . fluticasone (FLONASE) 50 MCG/ACT nasal spray Place 2 sprays into both nostrils daily.    . INVOKANA 300 MG TABS tablet Take 300 mg by mouth daily.  0  . Lancets (ACCU-CHEK MULTICLIX) lancets Use as directed 3 times daily to check blood sugar 102 each 2  . Omega-3 Fatty Acids (FISH OIL) 1000 MG CAPS Take 1 capsule by mouth 2 (two) times daily.    Marland Kitchen  sitaGLIPtin-metformin (JANUMET) 50-1000 MG per tablet Take 1 tablet by mouth 2 (two) times daily with a meal. 180 tablet 1  . ZEGERID OTC 20-1100 MG CAPS capsule TAKE ONE CAPSULE BY MOUTH EVERY DAY BEFORE BREAKFAST 28 capsule 5   No current facility-administered medications on file prior to visit.    Pulse 53  Temp(Src) 98.1 F (36.7 C) (Oral)  Resp 16  Ht 5\' 4"  (1.626 m)  Wt 207 lb 9.6 oz (94.167 kg)  BMI 35.62 kg/m2  SpO2 99%       Objective:   Physical Exam  Constitutional: He is oriented to person, place, and time. He appears well-developed and well-nourished. No distress.  HENT:  Head: Normocephalic and atraumatic.  Cardiovascular: Normal rate and regular rhythm.   No murmur heard. Pulmonary/Chest: Effort normal and breath sounds normal. No respiratory distress. He has no wheezes. He has no rales.  Musculoskeletal: He exhibits no edema.  Neurological: He is alert and oriented to person, place, and time.  Skin: Skin is warm and dry.  Psychiatric: He has a normal mood and affect. His behavior is  normal. Thought content normal.          Assessment & Plan:  Daytime somnolence/witnessed apneas- pt scored 14 on epsworth sleepiness scale. Will refer for split night study to evaluate for sleep apnea.

## 2015-01-20 NOTE — Assessment & Plan Note (Signed)
BP is stalbe on current meds. Continue same.

## 2015-01-20 NOTE — Assessment & Plan Note (Signed)
At goal on statin, continue same.

## 2015-01-20 NOTE — Progress Notes (Signed)
Pre visit review using our clinic review tool, if applicable. No additional management support is needed unless otherwise documented below in the visit note. 

## 2015-01-20 NOTE — Patient Instructions (Signed)
Please schedule a lab draw at the front desk. Work on [employer], exercise, weight loss. You will be contacted about your sleep study.

## 2015-01-20 NOTE — Assessment & Plan Note (Signed)
Stable, continue current meds.  Obtain bmet, A1C

## 2015-01-21 ENCOUNTER — Other Ambulatory Visit: Payer: Self-pay | Admitting: Family

## 2015-01-23 ENCOUNTER — Encounter: Payer: Self-pay | Admitting: Family

## 2015-01-23 ENCOUNTER — Other Ambulatory Visit (INDEPENDENT_AMBULATORY_CARE_PROVIDER_SITE_OTHER): Payer: BC Managed Care – PPO

## 2015-01-23 DIAGNOSIS — E785 Hyperlipidemia, unspecified: Secondary | ICD-10-CM

## 2015-01-23 LAB — BASIC METABOLIC PANEL
BUN: 18 mg/dL (ref 6–23)
CHLORIDE: 106 meq/L (ref 96–112)
CO2: 28 meq/L (ref 19–32)
Calcium: 9.2 mg/dL (ref 8.4–10.5)
Creatinine, Ser: 1.09 mg/dL (ref 0.40–1.50)
GFR: 87.59 mL/min (ref >60.00)
GLUCOSE: 141 mg/dL — AB (ref 70–99)
POTASSIUM: 3.7 meq/L (ref 3.5–5.1)
SODIUM: 144 meq/L (ref 135–145)

## 2015-01-23 LAB — HEMOGLOBIN A1C: Hgb A1c MFr Bld: 6.6 % — ABNORMAL HIGH (ref 4.6–6.5)

## 2015-02-20 ENCOUNTER — Ambulatory Visit (HOSPITAL_BASED_OUTPATIENT_CLINIC_OR_DEPARTMENT_OTHER): Payer: BC Managed Care – PPO | Attending: Family

## 2015-02-20 DIAGNOSIS — R0683 Snoring: Secondary | ICD-10-CM | POA: Diagnosis not present

## 2015-02-20 DIAGNOSIS — I493 Ventricular premature depolarization: Secondary | ICD-10-CM | POA: Insufficient documentation

## 2015-02-20 DIAGNOSIS — G4719 Other hypersomnia: Secondary | ICD-10-CM | POA: Insufficient documentation

## 2015-02-20 DIAGNOSIS — R400 Somnolence: Secondary | ICD-10-CM

## 2015-02-21 ENCOUNTER — Telehealth: Payer: Self-pay | Admitting: Family

## 2015-02-21 DIAGNOSIS — G4710 Hypersomnia, unspecified: Secondary | ICD-10-CM | POA: Diagnosis not present

## 2015-02-21 NOTE — Progress Notes (Signed)
Patient Name: Keith Hardin, Din Date: 02/20/2015 Gender: Male D.O.B: [birth year] Age (years): 64 Referring Provider: Earlie Counts Height (inches): 64 Interpreting Physician: Kara Mead MD, ABSM Weight (lbs): 201 RPSGT: Joni Reining BMI: 34 MRN: [id number] Neck Size: 16.00   CLINICAL INFORMATION Sleep Study Type: NPSG Indication for sleep study: Excessive Daytime Sleepiness Epworth Sleepiness Score: 21   SLEEP STUDY TECHNIQUE As per the AASM Manual for the Scoring of Sleep and Associated Events v2.3 (April 2016) with a hypopnea requiring 4% desaturations. The channels recorded and monitored were frontal, central and occipital EEG, electrooculogram (EOG), submentalis EMG (chin), nasal and oral airflow, thoracic and abdominal wall motion, anterior tibialis EMG, snore microphone, electrocardiogram, and pulse oximetry.   MEDICATIONS  Medications self-administered by patient during sleep study : No sleep medicine administered.   SLEEP ARCHITECTURE The study was initiated at 9:46:38 PM and ended at 4:30:39 AM. Sleep onset time was 5.2 minutes and the sleep efficiency was 75.3%. The total sleep time was 304.3 minutes. Stage REM latency was 158.5 minutes. The patient spent 7.89% of the night in stage N1 sleep, 76.84% in stage N2 sleep, 0.00% in stage N3 and 15.28% in REM. Alpha intrusion was absent. Supine sleep was 85.20%.   RESPIRATORY PARAMETERS The overall apnea/hypopnea index (AHI) was 4.9 per hour. There were 3 total apneas, including 0 obstructive, 3 central and 0 mixed apneas. There were 22 hypopneas and 3 RERAs. The AHI during Stage REM sleep was 18.1 per hour. AHI while supine was 4.2 per hour. The mean oxygen saturation was 94.45%. The minimum SpO2 during sleep was 87.00%. Soft snoring was noted during this study.   CARDIAC DATA The 2 lead EKG demonstrated sinus rhythm. The mean heart rate was 55.68 beats per minute. Other EKG findings include:  PVCs.   LEG MOVEMENT DATA The total PLMS were 0 with a resulting PLMS index of 0.00. Associated arousal with leg movement index was 0.0 .   IMPRESSIONS - No significant obstructive sleep apnea occurred during this study (AHI = 4.9/h). - No significant central sleep apnea occurred during this study (CAI = 0.6/h). - Mild oxygen desaturation was noted during this study (Min O2 = 87.00%). - The patient snored with Soft snoring volume. - EKG findings include PVCs. - Clinically significant periodic limb movements did not occur during sleep. No significant associated arousals.   DIAGNOSIS - No evidence of sleep disordered breathing   RECOMMENDATIONS - Avoid alcohol, sedatives and other CNS depressants that may worsen sleep apnea and disrupt normal sleep architecture. - Sleep hygiene should be reviewed to assess factors that may improve sleep quality. - Weight management and regular exercise should be initiated or continued if appropriate.  Kara Mead MD. [facility]. [facility]

## 2015-02-21 NOTE — Telephone Encounter (Signed)
Please let pt know that sleep study did not show sleep apnea. Good news.

## 2015-02-21 NOTE — Telephone Encounter (Signed)
Notified pt and he voices understanding. 

## 2015-02-28 ENCOUNTER — Other Ambulatory Visit: Payer: Self-pay | Admitting: Family

## 2015-03-04 ENCOUNTER — Other Ambulatory Visit: Payer: Self-pay | Admitting: Family

## 2015-03-04 NOTE — Telephone Encounter (Signed)
Medication Detail      Disp Refills Start End     pravastatin (PRAVACHOL) 80 MG tablet 90 tablet 1 01/20/2015     Sig - Route: Take 1 tablet (80 mg total) by mouth daily. - Oral    E-Prescribing Status: Receipt confirmed by pharmacy (01/20/2015 5:34 PM EDT)   Pharmacy    [id number] - [place], [place] - [address]   Medication Detail      Disp Refills Start End     labetalol (NORMODYNE) 200 MG tablet 180 tablet 1 01/20/2015     Sig - Route: Take 1 tablet (200 mg total) by mouth 2 (two) times daily. - Oral    E-Prescribing Status: Receipt confirmed by pharmacy (01/20/2015 5:34 PM EDT)   Pharmacy    [id number] - [place], [place] - [address]   Medication Detail      Disp Refills Start End     diltiazem (CARDIZEM CD) 180 MG 24 hr capsule 90 capsule 1 01/20/2015     Sig - Route: Take 1 capsule (180 mg total) by mouth daily. - Oral    E-Prescribing Status: Receipt confirmed by pharmacy (01/20/2015 5:34 PM EDT)     [id number] - [place], [place] - [address] AT [address] with pharmacy that Rx[s] were received on 01/20/15, 90-day supply with one refill; duplicate request Denied/SLS

## 2015-03-08 ENCOUNTER — Other Ambulatory Visit: Payer: Self-pay | Admitting: Family

## 2015-03-19 ENCOUNTER — Encounter: Payer: BC Managed Care – PPO | Admitting: Gastroenterology

## 2015-04-01 ENCOUNTER — Ambulatory Visit (INDEPENDENT_AMBULATORY_CARE_PROVIDER_SITE_OTHER): Payer: BC Managed Care – PPO | Admitting: Family

## 2015-04-01 ENCOUNTER — Encounter: Payer: Self-pay | Admitting: Family

## 2015-04-01 VITALS — BP 131/65 | HR 56 | Temp 97.7°F | Resp 16 | Ht 64.0 in | Wt 209.8 lb

## 2015-04-01 DIAGNOSIS — R011 Cardiac murmur, unspecified: Secondary | ICD-10-CM | POA: Diagnosis not present

## 2015-04-01 DIAGNOSIS — J029 Acute pharyngitis, unspecified: Secondary | ICD-10-CM | POA: Diagnosis not present

## 2015-04-01 DIAGNOSIS — J020 Streptococcal pharyngitis: Secondary | ICD-10-CM

## 2015-04-01 LAB — POCT RAPID STREP A (OFFICE): Rapid Strep A Screen: POSITIVE — AB

## 2015-04-01 MED ORDER — AMOXICILLIN 500 MG PO CAPS: 500.0000 mg | ORAL_CAPSULE | Freq: Two times a day (BID) | ORAL | Status: DC

## 2015-04-01 MED ORDER — OMEPRAZOLE-SODIUM BICARBONATE 20-1100 MG PO CAPS: ORAL_CAPSULE | ORAL | Status: DC

## 2015-04-01 NOTE — Progress Notes (Signed)
Subjective:    Patient ID: Keith Hardin, male    DOB: [birth year], 64 y.o.   MRN: [id number]  HPI   Keith Hardin is a 64 yr old male who presents today with chief complaint of sore throat, x 1 week.  Had aching in his shoulders the Friday after thanksgiving, then developed sinus congestion.  + cough.  Out of work last week.  Reports + subjective temp. Tried coricidin HBP.      Review of Systems See HPI  Past Medical History  Diagnosis Date  . Allergy   . Diabetes mellitus     type II  . Hyperlipidemia   . Colon polyps   . Orchitis and epididymitis 01/2008    left with left hydrocele  . Hypogonadism male 05/02/2011  . Hypertension     Social History   Social History  . Marital Status: Married    Spouse Name: Keith Hardin  . Number of Children: 0  . Years of Education: N/A   Occupational History  . Unemployed    Social History Main Topics  . Smoking status: Never Smoker   . Smokeless tobacco: Never Used  . Alcohol Use: No  . Drug Use: No  . Sexual Activity: Not on file   Other Topics Concern  . Not on file   Social History Narrative   Occupation: unemployed   Married since 1999   No children     Past Surgical History  Procedure Laterality Date  . Achilles tendon repair      right    Family History  Problem Relation Age of Onset  . Seizures Mother   . Hypertension Mother   . Cancer Brother     stomach  . Colon cancer Neg Hx   . Diabetes Neg Hx   . Heart attack Neg Hx   . Hyperlipidemia Neg Hx   . Sudden death Neg Hx     No Known Allergies  Current Outpatient Prescriptions on File Prior to Visit  Medication Sig Dispense Refill  . ACCU-CHEK AVIVA test strip Use to test blood sugar three times daily. 50 each 1  . aspirin 81 MG tablet Take 81 mg by mouth daily.      . bimatoprost (LUMIGAN) 0.03 % ophthalmic solution Place into both eyes daily.    . cloNIDine (CATAPRES) 0.3 MG tablet Take 1 tablet (0.3 mg total) by mouth 2 (two) times daily. 180  tablet 1  . diltiazem (CARDIZEM CD) 180 MG 24 hr capsule Take 1 capsule (180 mg total) by mouth daily. 90 capsule 1  . fexofenadine (ALLEGRA) 180 MG tablet Take 180 mg by mouth daily.    . fluticasone (FLONASE) 50 MCG/ACT nasal spray Place 2 sprays into both nostrils daily.    . hydrochlorothiazide (HYDRODIURIL) 25 MG tablet TAKE 1 TABLET BY MOUTH EVERY DAY 90 tablet 1  . INVOKANA 300 MG TABS tablet Take 300 mg by mouth daily.  0  . labetalol (NORMODYNE) 200 MG tablet Take 1 tablet (200 mg total) by mouth 2 (two) times daily. 180 tablet 1  . Lancets (ACCU-CHEK MULTICLIX) lancets Use as directed 3 times daily to check blood sugar 102 each 2  . lisinopril (PRINIVIL,ZESTRIL) 40 MG tablet Take 1 tablet (40 mg total) by mouth daily. 90 tablet 1  . Omega-3 Fatty Acids (FISH OIL) 1000 MG CAPS Take 1 capsule by mouth 2 (two) times daily.    . pioglitazone (ACTOS) 30 MG tablet Take 1 tablet (30 mg total) by  mouth daily. 90 tablet 1  . pravastatin (PRAVACHOL) 80 MG tablet TAKE 1 TABLET(80 MG) BY MOUTH DAILY 90 tablet 1  . sitaGLIPtin-metformin (JANUMET) 50-1000 MG per tablet Take 1 tablet by mouth 2 (two) times daily with a meal. 180 tablet 1   No current facility-administered medications on file prior to visit.    BP 131/65 mmHg  Pulse 56  Temp(Src) 97.7 F (36.5 C) (Oral)  Resp 16  Ht 5\' 4"  (1.626 m)  Wt 209 lb 12.8 oz (95.165 kg)  BMI 35.99 kg/m2  SpO2 100%       Objective:   Physical Exam  Constitutional: He is oriented to person, place, and time. He appears well-developed and well-nourished. No distress.  HENT:  Head: Normocephalic and atraumatic.  Right Ear: Tympanic membrane and ear canal normal.  Left Ear: Tympanic membrane and ear canal normal.  Mouth/Throat: Posterior oropharyngeal erythema present. No oropharyngeal exudate or posterior oropharyngeal edema.  Eyes: No scleral icterus.  Cardiovascular: Normal rate and regular rhythm.   Murmur heard.  Systolic murmur is present  with a grade of 2/6  Pulmonary/Chest: Effort normal and breath sounds normal. No respiratory distress. He has no wheezes. He has no rales.  Musculoskeletal: He exhibits no edema.  Lymphadenopathy:    He has no cervical adenopathy.  Neurological: He is alert and oriented to person, place, and time.  Skin: Skin is warm and dry.  Psychiatric: He has a normal mood and affect. His behavior is normal. Thought content normal.          Assessment & Plan:  Murmur- had echo performed in 2014- noted mild calcification of aortic valve. Murmur sounds louder today. Will repeat 2-D echo.    Strep throat- Rapid strep +, rx with amoxicillin, tylenol prn pain, follow up if symptoms worsen or do not improve.

## 2015-04-01 NOTE — Patient Instructions (Signed)
Please start amoxicillin twice daily. You may use amoxicillin as needed for sore throat.   Call if symptoms worsen or if not improved in 2-3 days.   Strep Throat Strep throat is a bacterial infection of the throat. Your health care provider may call the infection tonsillitis or pharyngitis, depending on whether there is swelling in the tonsils or at the back of the throat. Strep throat is most common during the cold months of the year in children who are 69-17 years of age, but it can happen during any season in people of any age. This infection is spread from person to person (contagious) through coughing, sneezing, or close contact. CAUSES Strep throat is caused by the bacteria called Streptococcus pyogenes. RISK FACTORS This condition is more likely to develop in:  People who spend time in crowded places where the infection can spread easily.  People who have close contact with someone who has strep throat. SYMPTOMS Symptoms of this condition include:  Fever or chills.   Redness, swelling, or pain in the tonsils or throat.  Pain or difficulty when swallowing.  White or yellow spots on the tonsils or throat.  Swollen, tender glands in the neck or under the jaw.  Red rash all over the body (rare). DIAGNOSIS This condition is diagnosed by performing a rapid strep test or by taking a swab of your throat (throat culture test). Results from a rapid strep test are usually ready in a few minutes, but throat culture test results are available after one or two days. TREATMENT This condition is treated with antibiotic medicine. HOME CARE INSTRUCTIONS Medicines  Take over-the-counter and prescription medicines only as told by your health care provider.  Take your antibiotic as told by your health care provider. Do not stop taking the antibiotic even if you start to feel better.  Have family members who also have a sore throat or fever tested for strep throat. They may need antibiotics  if they have the strep infection. Eating and Drinking  Do not share food, drinking cups, or personal items that could cause the infection to spread to other people.  If swallowing is difficult, try eating soft foods until your sore throat feels better.  Drink enough fluid to keep your urine clear or pale yellow. General Instructions  Gargle with a salt-water mixture 3-4 times per day or as needed. To make a salt-water mixture, completely dissolve -1 tsp of salt in 1 cup of warm water.  Make sure that all household members wash their hands well.  Get plenty of rest.  Stay home from school or work until you have been taking antibiotics for 24 hours.  Keep all follow-up visits as told by your health care provider. This is important. SEEK MEDICAL CARE IF:  The glands in your neck continue to get bigger.  You develop a rash, cough, or earache.  You cough up a thick liquid that is green, yellow-brown, or bloody.  You have pain or discomfort that does not get better with medicine.  Your problems seem to be getting worse rather than better.  You have a fever. SEEK IMMEDIATE MEDICAL CARE IF:  You have new symptoms, such as vomiting, severe headache, stiff or painful neck, chest pain, or shortness of breath.  You have severe throat pain, drooling, or changes in your voice.  You have swelling of the neck, or the skin on the neck becomes red and tender.  You have signs of dehydration, such as fatigue, dry mouth, and  decreased urination.  You become increasingly sleepy, or you cannot wake up completely.  Your joints become red or painful.   This information is not intended to replace advice given to you by your health care provider. Make sure you discuss any questions you have with your health care provider.   Document Released: 04/09/2000 Document Revised: 01/01/2015 Document Reviewed: 08/05/2014 Elsevier Interactive Patient Education [employer].

## 2015-04-01 NOTE — Progress Notes (Signed)
Pre visit review using our clinic review tool, if applicable. No additional management support is needed unless otherwise documented below in the visit note. 

## 2015-04-09 ENCOUNTER — Other Ambulatory Visit: Payer: Self-pay | Admitting: Family

## 2015-06-11 ENCOUNTER — Telehealth: Payer: Self-pay | Admitting: *Deleted

## 2015-06-11 NOTE — Telephone Encounter (Signed)
Pt called requesting co-pay card for Janumet. Card placed at front desk and pt made aware.

## 2015-07-19 ENCOUNTER — Other Ambulatory Visit: Payer: Self-pay | Admitting: Family

## 2015-07-20 ENCOUNTER — Other Ambulatory Visit: Payer: Self-pay | Admitting: Family

## 2015-07-21 NOTE — Telephone Encounter (Signed)
90 day supply of actos and clonidine sent to pharmacy. Pt is past due for follow up and needs appt before we can give further refills. Please call pt and schedule f/u with Melissa soon!  Thanks!

## 2015-07-21 NOTE — Telephone Encounter (Signed)
Called. lvm for pt to schedule.

## 2015-07-28 ENCOUNTER — Encounter: Payer: Self-pay | Admitting: Family

## 2015-07-28 ENCOUNTER — Ambulatory Visit (INDEPENDENT_AMBULATORY_CARE_PROVIDER_SITE_OTHER): Payer: BC Managed Care – PPO | Admitting: Family

## 2015-07-28 DIAGNOSIS — I10 Essential (primary) hypertension: Secondary | ICD-10-CM

## 2015-07-28 DIAGNOSIS — R011 Cardiac murmur, unspecified: Secondary | ICD-10-CM

## 2015-07-28 DIAGNOSIS — E119 Type 2 diabetes mellitus without complications: Secondary | ICD-10-CM

## 2015-07-28 DIAGNOSIS — I350 Nonrheumatic aortic (valve) stenosis: Secondary | ICD-10-CM | POA: Diagnosis not present

## 2015-07-28 DIAGNOSIS — E785 Hyperlipidemia, unspecified: Secondary | ICD-10-CM

## 2015-07-28 DIAGNOSIS — E1110 Type 2 diabetes mellitus with ketoacidosis without coma: Secondary | ICD-10-CM

## 2015-07-28 DIAGNOSIS — Z1159 Encounter for screening for other viral diseases: Secondary | ICD-10-CM

## 2015-07-28 DIAGNOSIS — Z114 Encounter for screening for human immunodeficiency virus [HIV]: Secondary | ICD-10-CM

## 2015-07-28 MED ORDER — SITAGLIPTIN PHOS-METFORMIN HCL 50-1000 MG PO TABS: 1.0000 | ORAL_TABLET | Freq: Two times a day (BID) | ORAL | Status: DC

## 2015-07-28 MED ORDER — DILTIAZEM HCL ER COATED BEADS 180 MG PO CP24: 180.0000 mg | ORAL_CAPSULE | Freq: Every day | ORAL | Status: DC

## 2015-07-28 MED ORDER — LABETALOL HCL 200 MG PO TABS: 200.0000 mg | ORAL_TABLET | Freq: Two times a day (BID) | ORAL | Status: DC

## 2015-07-28 MED ORDER — PRAVASTATIN SODIUM 80 MG PO TABS: ORAL_TABLET | ORAL | Status: DC

## 2015-07-28 NOTE — Progress Notes (Signed)
Pre visit review using our clinic review tool, if applicable. No additional management support is needed unless otherwise documented below in the visit note. 

## 2015-07-28 NOTE — Assessment & Plan Note (Signed)
Repeat echo- murmur seems louder on exam. Did not complete when ordered in December. Will re-order.

## 2015-07-28 NOTE — Patient Instructions (Addendum)
Please schedule lab draw at the front desk. You will be contacted about your echocardiogram.  Follow up in 3 months.

## 2015-07-28 NOTE — Assessment & Plan Note (Signed)
BP is stable on current meds.  Continue same.  

## 2015-07-28 NOTE — Assessment & Plan Note (Signed)
Tolerating statin.  Obtain lipid panel.

## 2015-07-28 NOTE — Assessment & Plan Note (Signed)
Clinically stable. Discussed weight loss. Obtain A1C, urine microalbumin, continue current meds.

## 2015-07-28 NOTE — Progress Notes (Signed)
Subjective:    Patient ID: Keith Hardin, male    DOB: [birth year], 65 y.o.   MRN: [id number]  HPI   Keith Hardin is a 65 yr old male who presents today for follow up.   DM2- currently maintained  On invokana, janumet, actos.  Reports diet is good.  Has had some weight gain- walking at work.  Reports sugars around 112-120.   Lab Results  Component Value Date   HGBA1C 6.6* 01/23/2015   HGBA1C 6.7* 10/04/2014   HGBA1C 7.0* 07/15/2014   Lab Results  Component Value Date   MICROALBUR <0.7 10/21/2014   LDLCALC 79 10/04/2014   CREATININE 1.09 01/23/2015   HTN- currently maintained on HCTZ, labetalol, clonidine, lisinopril.  Denies CP/SOB or swelling.   BP Readings from Last 3 Encounters:  07/28/15 138/80  04/01/15 131/65  10/21/14 112/70   Hyperlipidemia- on pravastatin.  Denies myalgia.  Lab Results  Component Value Date   CHOL 152 10/04/2014   HDL 55 10/04/2014   LDLCALC 79 10/04/2014   TRIG 91 10/04/2014   CHOLHDL 3.5 10/29/2013    Review of Systems    see HPI  Past Medical History  Diagnosis Date  . Allergy   . Diabetes mellitus     type II  . Hyperlipidemia   . Colon polyps   . Orchitis and epididymitis 01/2008    left with left hydrocele  . Hypogonadism male 05/02/2011  . Hypertension     Social History   Social History  . Marital Status: Married    Spouse Name: [name]  . Number of Children: 0  . Years of Education: N/A   Occupational History  . Unemployed    Social History Main Topics  . Smoking status: Never Smoker   . Smokeless tobacco: Never Used  . Alcohol Use: No  . Drug Use: No  . Sexual Activity: Not on file   Other Topics Concern  . Not on file   Social History Narrative   Occupation: unemployed   Married since 1999   No children     Past Surgical History  Procedure Laterality Date  . Achilles tendon repair      right    Family History  Problem Relation Age of Onset  . Seizures Mother   . Hypertension Mother   .  Cancer Brother     stomach  . Colon cancer Neg Hx   . Diabetes Neg Hx   . Heart attack Neg Hx   . Hyperlipidemia Neg Hx   . Sudden death Neg Hx     No Known Allergies  Current Outpatient Prescriptions on File Prior to Visit  Medication Sig Dispense Refill  . ACCU-CHEK AVIVA test strip Use to test blood sugar three times daily. 50 each 1  . aspirin 81 MG tablet Take 81 mg by mouth daily.      . bimatoprost (LUMIGAN) 0.03 % ophthalmic solution Place into both eyes daily.    . cloNIDine (CATAPRES) 0.3 MG tablet TAKE 1 TABLET BY MOUTH TWICE DAILY 180 tablet 0  . diltiazem (CARDIZEM CD) 180 MG 24 hr capsule Take 1 capsule (180 mg total) by mouth daily. 90 capsule 1  . fexofenadine (ALLEGRA) 180 MG tablet Take 180 mg by mouth daily.    . fluticasone (FLONASE) 50 MCG/ACT nasal spray Place 2 sprays into both nostrils daily.    . hydrochlorothiazide (HYDRODIURIL) 25 MG tablet TAKE 1 TABLET BY MOUTH EVERY DAY 90 tablet 1  . INVOKANA 300  MG TABS tablet Take 300 mg by mouth daily.  0  . labetalol (NORMODYNE) 200 MG tablet Take 1 tablet (200 mg total) by mouth 2 (two) times daily. 180 tablet 1  . Lancets (ACCU-CHEK MULTICLIX) lancets Use as directed 3 times daily to check blood sugar 102 each 2  . lisinopril (PRINIVIL,ZESTRIL) 40 MG tablet TAKE 1 TABLET(40 MG) BY MOUTH DAILY 90 tablet 0  . Omega-3 Fatty Acids (FISH OIL) 1000 MG CAPS Take 1 capsule by mouth 2 (two) times daily.    E[employer] Bicarbonate (ZEGERID OTC) 20-1100 MG CAPS capsule TAKE ONE CAPSULE BY MOUTH EVERY DAY BEFORE BREAKFAST 28 capsule 5  . pioglitazone (ACTOS) 30 MG tablet TAKE 1 TABLET(30 MG) BY MOUTH DAILY 90 tablet 0  . pravastatin (PRAVACHOL) 80 MG tablet TAKE 1 TABLET(80 MG) BY MOUTH DAILY 90 tablet 1  . sitaGLIPtin-metformin (JANUMET) 50-1000 MG per tablet Take 1 tablet by mouth 2 (two) times daily with a meal. 180 tablet 1   No current facility-administered medications on file prior to visit.    BP 138/80 mmHg   Pulse 61  Temp(Src) 97.8 F (36.6 C) (Oral)  Resp 18  Ht 5\' 4"  (1.626 m)  Wt 208 lb 9.6 oz (94.62 kg)  BMI 35.79 kg/m2  SpO2 98%    Objective:   Physical Exam  Constitutional: He is oriented to person, place, and time. He appears well-developed and well-nourished. No distress.  HENT:  Head: Normocephalic and atraumatic.  Cardiovascular: Normal rate and regular rhythm.   Murmur heard.  Systolic murmur is present with a grade of 3/6  Pulmonary/Chest: Effort normal and breath sounds normal. No respiratory distress. He has no wheezes. He has no rales.  Musculoskeletal: He exhibits no edema.  Neurological: He is alert and oriented to person, place, and time.  Skin: Skin is warm and dry.  Psychiatric: He has a normal mood and affect. His behavior is normal. Thought content normal.          Assessment & Plan:

## 2015-07-29 ENCOUNTER — Encounter: Payer: Self-pay | Admitting: Family

## 2015-07-30 ENCOUNTER — Other Ambulatory Visit (INDEPENDENT_AMBULATORY_CARE_PROVIDER_SITE_OTHER): Payer: BC Managed Care – PPO

## 2015-07-30 DIAGNOSIS — Z1159 Encounter for screening for other viral diseases: Secondary | ICD-10-CM

## 2015-07-30 DIAGNOSIS — E785 Hyperlipidemia, unspecified: Secondary | ICD-10-CM | POA: Diagnosis not present

## 2015-07-30 DIAGNOSIS — E1310 Other specified diabetes mellitus with ketoacidosis without coma: Secondary | ICD-10-CM | POA: Diagnosis not present

## 2015-07-30 DIAGNOSIS — Z114 Encounter for screening for human immunodeficiency virus [HIV]: Secondary | ICD-10-CM

## 2015-07-30 DIAGNOSIS — E1110 Type 2 diabetes mellitus with ketoacidosis without coma: Secondary | ICD-10-CM

## 2015-07-30 LAB — MICROALBUMIN / CREATININE URINE RATIO
CREATININE, U: 73 mg/dL
Microalb Creat Ratio: 1 mg/g (ref 0.0–30.0)
Microalb, Ur: <0.7 mg/dL (ref 0.0–1.9)

## 2015-07-30 LAB — HEPATITIS C ANTIBODY: HCV AB: REACTIVE — AB

## 2015-07-30 LAB — BASIC METABOLIC PANEL
BUN: 24 mg/dL — AB (ref 6–23)
CHLORIDE: 106 meq/L (ref 96–112)
CO2: 30 meq/L (ref 19–32)
Calcium: 9.8 mg/dL (ref 8.4–10.5)
Creatinine, Ser: 1.2 mg/dL (ref 0.40–1.50)
GFR: 78.26 mL/min (ref >60.00)
Glucose, Bld: 176 mg/dL — ABNORMAL HIGH (ref 70–99)
Potassium: 3.9 mEq/L (ref 3.5–5.1)
Sodium: 145 mEq/L (ref 135–145)

## 2015-07-30 LAB — LIPID PANEL
CHOL/HDL RATIO: 3
Cholesterol: 154 mg/dL (ref 0–200)
HDL: 52.3 mg/dL (ref >39.00)
LDL CALC: 81 mg/dL (ref 0–99)
NonHDL: 102.02
TRIGLYCERIDES: 105 mg/dL (ref 0.0–149.0)
VLDL: 21 mg/dL (ref 0.0–40.0)

## 2015-07-30 LAB — HEMOGLOBIN A1C: Hgb A1c MFr Bld: 7.6 % — ABNORMAL HIGH (ref 4.6–6.5)

## 2015-07-31 ENCOUNTER — Telehealth: Payer: Self-pay | Admitting: Family

## 2015-07-31 DIAGNOSIS — B1920 Unspecified viral hepatitis C without hepatic coma: Secondary | ICD-10-CM

## 2015-07-31 LAB — HIV ANTIBODY (ROUTINE TESTING W REFLEX): HIV 1&2 Ab, 4th Generation: NONREACTIVE

## 2015-07-31 NOTE — Telephone Encounter (Addendum)
Lab work shows that he is positive for Hep C antibody but virus is undetectable. This likely means that he had hep C but his body cleared the virus.  Lab work also shows sugar above goal.   I would recommend that we add lantus 5 units once daily.  Please schedule nurse visit for teaching. He should bring rx pen with him.

## 2015-08-01 LAB — HEPATITIS C RNA QUANTITATIVE: HCV Quantitative: NOT DETECTED IU/mL (ref <15)

## 2015-08-02 MED ORDER — INSULIN GLARGINE 100 UNIT/ML SOLOSTAR PEN: 5.0000 [IU] | PEN_INJECTOR | Freq: Every day | SUBCUTANEOUS | Status: DC

## 2015-08-02 MED ORDER — INSULIN PEN NEEDLE 31G X 8 MM MISC: Status: DC

## 2015-08-04 NOTE — Telephone Encounter (Signed)
Keith Hardin has been made aware of his results and he has verbalized understanding, he will come in on Thursday to have insulin instruction. Copy of the labs have been mailed and he did not have any questions at this time but has agreed to call back with questions.    KP

## 2015-08-07 ENCOUNTER — Ambulatory Visit: Payer: BC Managed Care – PPO | Admitting: *Deleted

## 2015-08-07 DIAGNOSIS — Z7189 Other specified counseling: Secondary | ICD-10-CM

## 2015-08-07 NOTE — Progress Notes (Addendum)
Pre visit review using our clinic review tool, if applicable. No additional management support is needed unless otherwise documented below in the visit note.  Per 07/31/15 telephone note by Debbrah Alar, NP: I would recommend that we add lantus 5 units once daily. Please schedule nurse visit for teaching. He should bring rx pen with him.   Pt presents to office today for teaching on his insulin pen. Pt educated w/ teach back on how to use pen, including inspecting insulin, attaching needle, safety check, selecting dosage, and proper technique for self-injection of insulin. Pt successfully demonstrated his first injection in office. Pt also educated on and demonstrated how to remove needle from device and appropriate sharps safety and storage, and storage of insulin pen. Pt was able to repeat back all instructions and his dosing instructions for Lantus. Pt also verbalized S/S of hypoglycemia, and how to treat hypoglycemic episodes. Pt due for first home dose of Lantus starting tomorrow evening. He reports compliance in checking daily CBGs. Pt had no further questions or concerns at the end of the teaching session.   Dorrene German, RN

## 2015-08-20 ENCOUNTER — Ambulatory Visit (HOSPITAL_BASED_OUTPATIENT_CLINIC_OR_DEPARTMENT_OTHER)
Admission: RE | Admit: 2015-08-20 | Discharge: 2015-08-20 | Disposition: A | Discharge status: Home / Self Care | Payer: BC Managed Care – PPO | Source: Ambulatory Visit | Attending: Family | Admitting: Family

## 2015-08-20 ENCOUNTER — Encounter: Payer: Self-pay | Admitting: Family

## 2015-08-20 ENCOUNTER — Other Ambulatory Visit: Payer: Self-pay | Admitting: Family

## 2015-08-20 DIAGNOSIS — E785 Hyperlipidemia, unspecified: Secondary | ICD-10-CM | POA: Insufficient documentation

## 2015-08-20 DIAGNOSIS — R011 Cardiac murmur, unspecified: Secondary | ICD-10-CM | POA: Insufficient documentation

## 2015-08-20 DIAGNOSIS — I10 Essential (primary) hypertension: Secondary | ICD-10-CM | POA: Insufficient documentation

## 2015-08-20 DIAGNOSIS — E119 Type 2 diabetes mellitus without complications: Secondary | ICD-10-CM | POA: Insufficient documentation

## 2015-08-20 NOTE — Progress Notes (Signed)
  Echocardiogram 2D Echocardiogram has been performed.  Keith Hardin 08/20/2015, 11:28 AM

## 2015-08-23 ENCOUNTER — Other Ambulatory Visit: Payer: Self-pay | Admitting: Family

## 2015-08-30 ENCOUNTER — Other Ambulatory Visit: Payer: Self-pay | Admitting: Family

## 2015-09-16 ENCOUNTER — Telehealth: Payer: Self-pay | Admitting: Family

## 2015-09-16 NOTE — Telephone Encounter (Signed)
Relation to [id number] Call back number:[phone] Pharmacy: [id number] - [place], [place] - [address] AT [address] [phone] (Fax)         Reason for call:  Patient requesting a coupon for janumet

## 2015-09-17 NOTE — Telephone Encounter (Signed)
Coupon placed at front desk for pick up and detailed message left on home #.

## 2015-10-19 ENCOUNTER — Other Ambulatory Visit: Payer: Self-pay | Admitting: Family

## 2015-10-20 ENCOUNTER — Other Ambulatory Visit: Payer: Self-pay | Admitting: Family

## 2015-10-20 NOTE — Telephone Encounter (Signed)
Rx sent to the pharmacy by e-script.//AB/CMA 

## 2015-10-24 ENCOUNTER — Other Ambulatory Visit: Payer: Self-pay | Admitting: Family

## 2015-10-24 NOTE — Telephone Encounter (Signed)
Refill sent per [facility] refill protocol/SLS  

## 2015-10-27 ENCOUNTER — Encounter: Payer: Self-pay | Admitting: Family

## 2015-10-27 ENCOUNTER — Ambulatory Visit (INDEPENDENT_AMBULATORY_CARE_PROVIDER_SITE_OTHER): Payer: BC Managed Care – PPO | Admitting: Family

## 2015-10-27 VITALS — BP 144/78 | HR 53 | Temp 97.6°F | Resp 18 | Ht 64.0 in | Wt 210.0 lb

## 2015-10-27 DIAGNOSIS — E119 Type 2 diabetes mellitus without complications: Secondary | ICD-10-CM | POA: Diagnosis not present

## 2015-10-27 DIAGNOSIS — D509 Iron deficiency anemia, unspecified: Secondary | ICD-10-CM | POA: Diagnosis not present

## 2015-10-27 DIAGNOSIS — I10 Essential (primary) hypertension: Secondary | ICD-10-CM

## 2015-10-27 DIAGNOSIS — N529 Male erectile dysfunction, unspecified: Secondary | ICD-10-CM | POA: Insufficient documentation

## 2015-10-27 DIAGNOSIS — E785 Hyperlipidemia, unspecified: Secondary | ICD-10-CM | POA: Diagnosis not present

## 2015-10-27 MED ORDER — SILDENAFIL CITRATE 25 MG PO TABS: ORAL_TABLET | ORAL | Status: DC

## 2015-10-27 MED ORDER — IRON 325 (65 FE) MG PO TABS: 1.0000 | ORAL_TABLET | Freq: Every day | ORAL | Status: DC

## 2015-10-27 NOTE — Progress Notes (Signed)
Subjective:    Patient ID: Keith Hardin, male    DOB: 10/04/1950, 65 y.o.   MRN: [id number]  HPI  Keith Hardin is a 65 yr old male who presents today for follow up.  1) DM2- currently maintained on lantus, invokana, actos and janumet. Last A1C at the [place] was 7.1 on 10/12/15. Reports that he checked sugar yesterday and it was 125.  Denies hypoglycemia.   Lab Results  Component Value Date   HGBA1C 7.6* 07/30/2015   HGBA1C 6.6* 01/23/2015   HGBA1C 6.7* 10/04/2014   Lab Results  Component Value Date   MICROALBUR <0.7 07/30/2015   LDLCALC 81 07/30/2015   CREATININE 1.20 07/30/2015   2) HTN- on clonidine, hctz, labetalol, lisinopril. BP Readings from Last 3 Encounters:  10/27/15 144/78  07/28/15 138/80  04/01/15 131/65   3) Hyperlipidemia- maintained on fish oil and pravastatin.  Lab Results  Component Value Date   CHOL 154 07/30/2015   HDL 52.30 07/30/2015   LDLCALC 81 07/30/2015   TRIG 105.0 07/30/2015   CHOLHDL 3 07/30/2015   4) ED- requesting rx for viagra to be faxed to his [facility] with a copy of his colonoscopy report. Has not tried viagra in the    Review of Systems See HPI  Past Medical History  Diagnosis Date  . Allergy   . Diabetes mellitus     type II  . Hyperlipidemia   . Colon polyps   . Orchitis and epididymitis 01/2008    left with left hydrocele  . Hypogonadism male 05/02/2011  . Hypertension      Social History   Social History  . Marital Status: Married    Spouse Name: [name]  . Number of Children: 0  . Years of Education: N/A   Occupational History  . Unemployed    Social History Main Topics  . Smoking status: Never Smoker   . Smokeless tobacco: Never Used  . Alcohol Use: No  . Drug Use: No  . Sexual Activity: Not on file   Other Topics Concern  . Not on file   Social History Narrative   Occupation: unemployed   Married since 1999   No children     Past Surgical History  Procedure Laterality Date  . Achilles tendon  repair      right    Family History  Problem Relation Age of Onset  . Seizures Mother   . Hypertension Mother   . Cancer Brother     stomach  . Colon cancer Neg Hx   . Diabetes Neg Hx   . Heart attack Neg Hx   . Hyperlipidemia Neg Hx   . Sudden death Neg Hx     No Known Allergies  Current Outpatient Prescriptions on File Prior to Visit  Medication Sig Dispense Refill  . ACCU-CHEK AVIVA test strip Use to test blood sugar three times daily. 50 each 1  . aspirin 81 MG tablet Take 81 mg by mouth daily.      . bimatoprost (LUMIGAN) 0.03 % ophthalmic solution Place into both eyes daily.    . cloNIDine (CATAPRES) 0.3 MG tablet TAKE 1 TABLET BY MOUTH TWICE DAILY 180 tablet 5  . diltiazem (CARDIZEM CD) 180 MG 24 hr capsule Take 1 capsule (180 mg total) by mouth daily. 90 capsule 1  . fexofenadine (ALLEGRA) 180 MG tablet Take 180 mg by mouth daily.    . fluticasone (FLONASE) 50 MCG/ACT nasal spray Place 2 sprays into both nostrils daily.    Marland Kitchen  hydrochlorothiazide (HYDRODIURIL) 25 MG tablet TAKE 1 TABLET(25 MG) BY MOUTH DAILY 90 tablet 1  . Insulin Glargine (LANTUS SOLOSTAR) 100 UNIT/ML Solostar Pen Inject 5 Units into the skin daily at 10 pm. 5 pen PRN  . Insulin Pen Needle 31G X 8 MM MISC Use as directed 100 each 0  . INVOKANA 300 MG TABS tablet Take 300 mg by mouth daily.  0  . labetalol (NORMODYNE) 200 MG tablet Take 1 tablet (200 mg total) by mouth 2 (two) times daily. 180 tablet 1  . Lancets (ACCU-CHEK MULTICLIX) lancets Use as directed 3 times daily to check blood sugar 102 each 2  . lisinopril (PRINIVIL,ZESTRIL) 40 MG tablet TAKE 1 TABLET(40 MG) BY MOUTH DAILY 90 tablet 0  . Omega-3 Fatty Acids (FISH OIL) 1000 MG CAPS Take 1 capsule by mouth 2 (two) times daily.    Earney Navy Bicarbonate (ZEGERID OTC) 20-1100 MG CAPS capsule TAKE ONE CAPSULE BY MOUTH EVERY DAY BEFORE BREAKFAST 28 capsule 5  . pioglitazone (ACTOS) 30 MG tablet TAKE 1 TABLET(30 MG) BY MOUTH DAILY 90 tablet 1    . pravastatin (PRAVACHOL) 80 MG tablet TAKE 1 TABLET(80 MG) BY MOUTH DAILY 90 tablet 1  . sitaGLIPtin-metformin (JANUMET) 50-1000 MG tablet Take 1 tablet by mouth 2 (two) times daily with a meal. 180 tablet 1   No current facility-administered medications on file prior to visit.    BP 144/78 mmHg  Pulse 53  Temp(Src) 97.6 F (36.4 C) (Oral)  Resp 18  Ht 5\' 4"  (1.626 m)  Wt 210 lb (95.255 kg)  BMI 36.03 kg/m2  SpO2 97%       Objective:   Physical Exam  Constitutional: He is oriented to person, place, and time. He appears well-developed and well-nourished. No distress.  HENT:  Head: Normocephalic and atraumatic.  Cardiovascular: Normal rate and regular rhythm.   No murmur heard. Pulmonary/Chest: Effort normal and breath sounds normal. No respiratory distress. He has no wheezes. He has no rales.  Musculoskeletal: He exhibits no edema.  Neurological: He is alert and oriented to person, place, and time.  Skin: Skin is warm and dry.  Psychiatric: He has a normal mood and affect. His behavior is normal. Thought content normal.          Assessment & Plan:

## 2015-10-27 NOTE — Assessment & Plan Note (Signed)
Fair BP, continue current meds.  

## 2015-10-27 NOTE — Assessment & Plan Note (Signed)
At goal, continue statin, fish oil.

## 2015-10-27 NOTE — Progress Notes (Signed)
Pre visit review using our clinic review tool, if applicable. No additional management support is needed unless otherwise documented below in the visit note. 

## 2015-10-27 NOTE — Patient Instructions (Signed)
Please follow up in 3 months.  You may begin viagra 1-2 tabs as needed.

## 2015-10-27 NOTE — Assessment & Plan Note (Signed)
Mild iron def anemia noted on 10/06/16 labs from the [place].  Will have pt begin an otc iron supplement 325mg  once daily.

## 2015-10-27 NOTE — Assessment & Plan Note (Signed)
Will fax colo report and viagra Rx to [facility] at pt request.

## 2015-11-09 ENCOUNTER — Other Ambulatory Visit: Payer: Self-pay | Admitting: Family

## 2015-11-10 NOTE — Telephone Encounter (Signed)
Rx sent to the pharmacy by e-script.//AB/CMA 

## 2015-11-12 ENCOUNTER — Telehealth: Payer: Self-pay | Admitting: Family

## 2015-11-12 MED ORDER — INSULIN PEN NEEDLE 32G X 4 MM MISC
Status: DC
Start: 2015-11-12 — End: 2016-05-15

## 2015-11-12 NOTE — Telephone Encounter (Signed)
°  Relationship to patient: Self Can be reached:[phone]  Pharmacy:  [place], [place] - [address] AT [place] [phone] (Phone) [phone] (Fax)         Reason for call: Request  rx changed to 22mm short pen because it will not go into his muscle.Marland Kitchen Rx was sent for B-D ULTRAFINE III SHORT PEN 31G X 8 MM MISC [id number]

## 2015-11-12 NOTE — Telephone Encounter (Signed)
Spoke with pt. He is requesting 90mm pen needles because they are shorter than what he has been using. Rx sent to [facility].

## 2016-01-15 ENCOUNTER — Telehealth: Payer: Self-pay | Admitting: Family

## 2016-01-15 MED ORDER — GLUCOSE BLOOD VI STRP: ORAL_STRIP | 1 refills | Status: AC

## 2016-01-15 NOTE — Telephone Encounter (Signed)
Relationship to patient: send Can be reached: [phone] Pharmacy: [facility],  - [address]  Reason for call: Pt needs to reorder test strips for accucheck smart view. He is out. He said that he has [employer]BS and the strips went from $10/month to being $50/month last month. He asked if we know how he can get cheaper.

## 2016-01-15 NOTE — Telephone Encounter (Signed)
Rx faxed, I reviewed the formulary and all of the test strips are tier 2 and many are not covered. I am unsure of the difference in cost between brands. I left a message for a return call.    KP

## 2016-01-16 NOTE — Telephone Encounter (Signed)
Notified pt. He states he had Rx transferred to [facility] in [place]. They used a discount card and pt is able to get strips for $15.

## 2016-01-20 ENCOUNTER — Other Ambulatory Visit: Payer: Self-pay | Admitting: Family

## 2016-01-26 ENCOUNTER — Ambulatory Visit: Payer: BC Managed Care – PPO | Admitting: Family

## 2016-01-26 ENCOUNTER — Telehealth: Payer: Self-pay | Admitting: Family

## 2016-01-26 NOTE — Telephone Encounter (Signed)
No charge. 

## 2016-01-26 NOTE — Telephone Encounter (Signed)
Patient left voicemail 01/23/16 at 4:27pm cancelling appointment. Charge or no charge

## 2016-02-14 ENCOUNTER — Other Ambulatory Visit: Payer: Self-pay | Admitting: Family

## 2016-02-17 ENCOUNTER — Telehealth: Payer: Self-pay | Admitting: Family

## 2016-02-17 NOTE — Telephone Encounter (Signed)
30 day supply of HCTZ sent to pharmacy. Pt cancelled f/u with Melissa on 01/26/16 and needs to r/s for further refills. Please call pt to schedule appt. Thanks!

## 2016-02-18 NOTE — Telephone Encounter (Signed)
Spoke with patient, patient states that he understands PCP policy, patient has to be seen in order to keep receiving Rx. Patient made appointment, and had no other questions or concerns.

## 2016-02-18 NOTE — Telephone Encounter (Signed)
lvm for pt. Informed him that an F/U appt is needed to continue medication refills.

## 2016-02-19 ENCOUNTER — Other Ambulatory Visit: Payer: Self-pay | Admitting: Family

## 2016-02-19 NOTE — Telephone Encounter (Signed)
Medication filled to pharmacy as requested.   

## 2016-02-25 ENCOUNTER — Encounter: Payer: Self-pay | Admitting: Family

## 2016-02-25 ENCOUNTER — Ambulatory Visit (INDEPENDENT_AMBULATORY_CARE_PROVIDER_SITE_OTHER): Payer: BC Managed Care – PPO | Admitting: Family

## 2016-02-25 VITALS — BP 132/63 | HR 52 | Temp 98.1°F | Resp 18 | Ht 64.0 in | Wt 215.2 lb

## 2016-02-25 DIAGNOSIS — D509 Iron deficiency anemia, unspecified: Secondary | ICD-10-CM

## 2016-02-25 DIAGNOSIS — I10 Essential (primary) hypertension: Secondary | ICD-10-CM | POA: Diagnosis not present

## 2016-02-25 DIAGNOSIS — D649 Anemia, unspecified: Secondary | ICD-10-CM | POA: Diagnosis not present

## 2016-02-25 DIAGNOSIS — E119 Type 2 diabetes mellitus without complications: Secondary | ICD-10-CM

## 2016-02-25 DIAGNOSIS — E785 Hyperlipidemia, unspecified: Secondary | ICD-10-CM

## 2016-02-25 MED ORDER — CLONIDINE HCL 0.3 MG PO TABS: 0.3000 mg | ORAL_TABLET | Freq: Two times a day (BID) | ORAL | 5 refills | Status: DC

## 2016-02-25 MED ORDER — PRAVASTATIN SODIUM 80 MG PO TABS: ORAL_TABLET | ORAL | 1 refills | Status: DC

## 2016-02-25 MED ORDER — HYDROCHLOROTHIAZIDE 25 MG PO TABS: 25.0000 mg | ORAL_TABLET | Freq: Every day | ORAL | 0 refills | Status: DC

## 2016-02-25 MED ORDER — LABETALOL HCL 200 MG PO TABS: 200.0000 mg | ORAL_TABLET | Freq: Two times a day (BID) | ORAL | 0 refills | Status: DC

## 2016-02-25 MED ORDER — LISINOPRIL 40 MG PO TABS: ORAL_TABLET | ORAL | 0 refills | Status: DC

## 2016-02-25 MED ORDER — DILTIAZEM HCL ER COATED BEADS 180 MG PO CP24: ORAL_CAPSULE | ORAL | 0 refills | Status: DC

## 2016-02-25 NOTE — Assessment & Plan Note (Signed)
Tolerating iron supplement, repeat cbc and iron level.

## 2016-02-25 NOTE — Assessment & Plan Note (Signed)
Clinically stable, management per endocrinology.

## 2016-02-25 NOTE — Assessment & Plan Note (Signed)
BP stable on current meds. Continue same.  

## 2016-02-25 NOTE — Progress Notes (Signed)
Subjective:    Patient ID: Keith Hardin, male    DOB: Sep 16, 1950, 65 y.o.   MRN: [id number]  HPI  Keith Hardin is a 65 yr old male who presents today for follow up of multiple medical problems.    1) HTN- Denies swelling, CP or SOB.   Maintained on cartia XT, clonidine, labetalol, lisinopril.   BP Readings from Last 3 Encounters:  02/25/16 132/63  10/27/15 (!) 144/78  07/28/15 138/80   2) Anemia- maintained on iron supplement. Denies constipation.  Lab Results  Component Value Date   WBC 4.9 04/12/2014   HGB 12.3 (L) 04/12/2014   HCT 38.2 (L) 04/12/2014   MCV 79.8 04/12/2014   PLT 262.0 04/12/2014   3)  DM2- He reports that he continues to follow up with Peri Jefferson PA-C endocrinology. He is currently maintained on lantus 16 units daily, invokana and janumet and actos.  Lab Results  Component Value Date   HGBA1C 7.6 (H) 07/30/2015   HGBA1C 6.6 (H) 01/23/2015   HGBA1C 6.7 (A) 10/04/2014   Lab Results  Component Value Date   MICROALBUR <0.7 07/30/2015   LDLCALC 81 07/30/2015   CREATININE 1.20 07/30/2015   4) Hyperlipidemia- maintained on pravastatin. Denies myalgia.  Lab Results  Component Value Date   CHOL 154 07/30/2015   HDL 52.30 07/30/2015   LDLCALC 81 07/30/2015   TRIG 105.0 07/30/2015   CHOLHDL 3 07/30/2015  Review of Systems See HPI  Past Medical History:  Diagnosis Date  . Allergy   . Colon polyps   . Diabetes mellitus    type II  . Hyperlipidemia   . Hypertension   . Hypogonadism male 05/02/2011  . Orchitis and epididymitis 01/2008   left with left hydrocele     Social History   Social History  . Marital status: Married    Spouse name: [name]  . Number of children: 0  . Years of education: N/A   Occupational History  . Unemployed Unemployed   Social History Main Topics  . Smoking status: Never Smoker  . Smokeless tobacco: Never Used  . Alcohol use No  . Drug use: No  . Sexual activity: Not on file   Other Topics Concern  . Not  on file   Social History Narrative   Occupation: unemployed   Married since 1999   No children     Past Surgical History:  Procedure Laterality Date  . ACHILLES TENDON REPAIR     right    Family History  Problem Relation Age of Onset  . Seizures Mother   . Hypertension Mother   . Cancer Brother     stomach  . Colon cancer Neg Hx   . Diabetes Neg Hx   . Heart attack Neg Hx   . Hyperlipidemia Neg Hx   . Sudden death Neg Hx     No Known Allergies  Current Outpatient Prescriptions on File Prior to Visit  Medication Sig Dispense Refill  . aspirin 81 MG tablet Take 81 mg by mouth daily.      . bimatoprost (LUMIGAN) 0.03 % ophthalmic solution Place into both eyes daily.    Marland Kitchen CARTIA XT 180 MG 24 hr capsule TAKE 1 CAPSULE(180 MG) BY MOUTH DAILY 90 capsule 0  . cloNIDine (CATAPRES) 0.3 MG tablet TAKE 1 TABLET BY MOUTH TWICE DAILY 180 tablet 5  . Ferrous Sulfate (IRON) 325 (65 Fe) MG TABS Take 1 tablet by mouth daily. 30 each 0  . fexofenadine (ALLEGRA)  180 MG tablet Take 180 mg by mouth daily.    . fluticasone (FLONASE) 50 MCG/ACT nasal spray Place 2 sprays into both nostrils daily.    Marland Kitchen glucose blood (ACCU-CHEK AVIVA) test strip Use to test blood sugar three times daily. 50 each 1  . hydrochlorothiazide (HYDRODIURIL) 25 MG tablet TAKE 1 TABLET BY MOUTH DAILY 30 tablet 0  . Insulin Glargine (LANTUS SOLOSTAR) 100 UNIT/ML Solostar Pen Inject 5 Units into the skin daily at 10 pm. (Patient taking differently: Inject 16 Units into the skin daily at 10 pm. ) 5 pen PRN  . Insulin Pen Needle (BD PEN NEEDLE NANO U/F) 32G X 4 MM MISC Use to inject insulin. Dx E11.9 100 each 1  . INVOKANA 300 MG TABS tablet Take 300 mg by mouth daily.  0  . labetalol (NORMODYNE) 200 MG tablet TAKE 1 TABLET BY MOUTH TWICE DAILY 180 tablet 0  . Lancets (ACCU-CHEK MULTICLIX) lancets Use as directed 3 times daily to check blood sugar 102 each 2  . lisinopril (PRINIVIL,ZESTRIL) 40 MG tablet TAKE 1 TABLET(40 MG) BY  MOUTH DAILY 90 tablet 0  . Omega-3 Fatty Acids (FISH OIL) 1000 MG CAPS Take 1 capsule by mouth 2 (two) times daily.    Earney Navy Bicarbonate (ZEGERID OTC) 20-1100 MG CAPS capsule TAKE ONE CAPSULE BY MOUTH EVERY DAY BEFORE BREAKFAST 28 capsule 5  . pioglitazone (ACTOS) 30 MG tablet TAKE 1 TABLET(30 MG) BY MOUTH DAILY 90 tablet 0  . pravastatin (PRAVACHOL) 80 MG tablet TAKE 1 TABLET(80 MG) BY MOUTH DAILY 90 tablet 1  . sildenafil (VIAGRA) 25 MG tablet 1-2 tabs by mouth prior to sexual activity 10 tablet 5  . sitaGLIPtin-metformin (JANUMET) 50-1000 MG tablet Take 1 tablet by mouth 2 (two) times daily with a meal. 180 tablet 1   No current facility-administered medications on file prior to visit.     BP 132/63 (BP Location: Right Arm, Patient Position: Sitting, Cuff Size: Large)   Pulse (!) 52   Temp 98.1 F (36.7 C) (Oral)   Resp 18   Ht 5\' 4"  (1.626 m)   Wt 215 lb 3.2 oz (97.6 kg)   SpO2 97% Comment: RA  BMI 36.94 kg/m       Objective:   Physical Exam  Constitutional: He is oriented to person, place, and time. He appears well-developed and well-nourished. No distress.  HENT:  Head: Normocephalic and atraumatic.  Cardiovascular: Normal rate and regular rhythm.   Murmur heard. Pulmonary/Chest: Effort normal and breath sounds normal. No respiratory distress. He has no wheezes. He has no rales.  Musculoskeletal: He exhibits no edema.  Neurological: He is alert and oriented to person, place, and time.  Skin: Skin is warm and dry.  Psychiatric: He has a normal mood and affect. His behavior is normal. Thought content normal.          Assessment & Plan:

## 2016-02-25 NOTE — Assessment & Plan Note (Signed)
LDL at goal, continue statin. 

## 2016-02-25 NOTE — Patient Instructions (Signed)
Please complete lab work prior to leaving.   

## 2016-02-25 NOTE — Progress Notes (Signed)
Pre visit review using our clinic review tool, if applicable. No additional management support is needed unless otherwise documented below in the visit note. 

## 2016-02-26 LAB — IRON: IRON: 87 ug/dL (ref 42–165)

## 2016-02-26 LAB — CBC WITH DIFFERENTIAL/PLATELET
BASOS PCT: 0.4 % (ref 0.0–3.0)
Basophils Absolute: 0 10*3/uL (ref 0.0–0.1)
Eosinophils Absolute: 0.2 10*3/uL (ref 0.0–0.7)
Eosinophils Relative: 4.3 % (ref 0.0–5.0)
HEMATOCRIT: 39.2 % (ref 39.0–52.0)
HEMOGLOBIN: 13 g/dL (ref 13.0–17.0)
LYMPHS PCT: 31.8 % (ref 12.0–46.0)
Lymphs Abs: 1.5 10*3/uL (ref 0.7–4.0)
MCHC: 33.2 g/dL (ref 30.0–36.0)
MCV: 82 fl (ref 78.0–100.0)
MONOS PCT: 10.5 % (ref 3.0–12.0)
Monocytes Absolute: 0.5 10*3/uL (ref 0.1–1.0)
NEUTROS ABS: 2.5 10*3/uL (ref 1.4–7.7)
Neutrophils Relative %: 53 % (ref 43.0–77.0)
PLATELETS: 214 10*3/uL (ref 150.0–400.0)
RBC: 4.79 Mil/uL (ref 4.22–5.81)
RDW: 17.2 % — AB (ref 11.5–15.5)
WBC: 4.7 10*3/uL (ref 4.0–10.5)

## 2016-02-26 LAB — BASIC METABOLIC PANEL
BUN: 24 mg/dL — AB (ref 6–23)
CHLORIDE: 105 meq/L (ref 96–112)
CO2: 25 mEq/L (ref 19–32)
CREATININE: 1.18 mg/dL (ref 0.40–1.50)
Calcium: 9.8 mg/dL (ref 8.4–10.5)
GFR: 79.65 mL/min (ref >60.00)
GLUCOSE: 79 mg/dL (ref 70–99)
POTASSIUM: 3.9 meq/L (ref 3.5–5.1)
Sodium: 143 mEq/L (ref 135–145)

## 2016-02-29 ENCOUNTER — Encounter: Payer: Self-pay | Admitting: Family

## 2016-03-29 ENCOUNTER — Telehealth: Payer: Self-pay | Admitting: *Deleted

## 2016-03-29 NOTE — Telephone Encounter (Signed)
Received fax from [facility] dated 03/25/16 that lantus is not covered by insurance plan and to call [phone] to initiate prior auth or change medication. Patient ID [id number]. Called insurance at 4pm and was on hold x 42minutes without speaking to a CSR and had to disconnect the call. Will try again tomorrow.

## 2016-03-30 MED ORDER — BASAGLAR KWIKPEN 100 UNIT/ML ~~LOC~~ SOPN: 16.0000 [IU] | PEN_INJECTOR | Freq: Every day | SUBCUTANEOUS | 3 refills | Status: DC

## 2016-03-30 NOTE — Telephone Encounter (Signed)
Spoke with Darrick Meigs at [employer], [phone] and was told that Velda Shell pen is a covered alternative. If PCP feels lantus is a better alternative we will need to initiate prior auth.  Please advise?

## 2016-03-30 NOTE — Telephone Encounter (Signed)
Ok to d/c lantus and begin [employer] at same dose (16 units) once daily.  Ok to send 1 month supply with 3 refills.

## 2016-03-30 NOTE — Telephone Encounter (Signed)
Notified pt and sent new Rx.

## 2016-04-20 ENCOUNTER — Other Ambulatory Visit: Payer: Self-pay | Admitting: Family

## 2016-04-22 ENCOUNTER — Other Ambulatory Visit: Payer: Self-pay | Admitting: Family

## 2016-05-15 ENCOUNTER — Other Ambulatory Visit: Payer: Self-pay | Admitting: Family

## 2016-05-16 ENCOUNTER — Other Ambulatory Visit: Payer: Self-pay | Admitting: Family Medicine

## 2016-05-17 ENCOUNTER — Other Ambulatory Visit: Payer: Self-pay | Admitting: Family

## 2016-05-19 ENCOUNTER — Other Ambulatory Visit: Payer: Self-pay | Admitting: Family

## 2016-06-01 ENCOUNTER — Telehealth: Payer: Self-pay | Admitting: Family

## 2016-06-01 NOTE — Telephone Encounter (Signed)
Patient called stating that he dropped off insurance paperwork last Friday 05/28/16. He is inquiring of the status. Please advise  Phone: [phone]

## 2016-06-01 NOTE — Telephone Encounter (Signed)
Ashlee or Angie-- please let me know if you have seen this?

## 2016-06-02 NOTE — Telephone Encounter (Signed)
Form received. Paperwork process initiated.   

## 2016-06-03 NOTE — Telephone Encounter (Signed)
Form completed and placed up front for pick up.  Left message on voicemail making patient aware that paperwork is ready for pick up.

## 2016-06-11 ENCOUNTER — Telehealth: Payer: Self-pay | Admitting: Family

## 2016-06-11 MED ORDER — OSELTAMIVIR PHOSPHATE 75 MG PO CAPS: 75.0000 mg | ORAL_CAPSULE | Freq: Every day | ORAL | 0 refills | Status: AC

## 2016-06-11 NOTE — Telephone Encounter (Signed)
Relation to [id number] Call back number:[phone] Pharmacy: [facility] [place],  - [address] AT [place] [phone] (Phone) [phone] (Fax)     Reason for call:  Patient states co worker has been dx with flu, requesting a Rx, please advise

## 2016-06-11 NOTE — Telephone Encounter (Signed)
rx sent

## 2016-06-11 NOTE — Telephone Encounter (Signed)
Notified pt and he voices understanding. 

## 2016-06-16 ENCOUNTER — Telehealth: Payer: Self-pay | Admitting: Family

## 2016-06-16 DIAGNOSIS — E119 Type 2 diabetes mellitus without complications: Secondary | ICD-10-CM

## 2016-06-16 NOTE — Telephone Encounter (Signed)
Called pt about Diabetic eye exam pt reported that he has an appointment scheduled he will call back with the date.

## 2016-06-16 NOTE — Telephone Encounter (Signed)
Insurance sent a letter that he is overdue for eye exam. If so, I have pended referral.

## 2016-06-18 NOTE — Telephone Encounter (Signed)
Patient called back with the following information 07/13/16 with [place] in [facility]

## 2016-07-17 ENCOUNTER — Other Ambulatory Visit: Payer: Self-pay | Admitting: Family

## 2016-08-16 ENCOUNTER — Other Ambulatory Visit: Payer: Self-pay | Admitting: Family

## 2016-08-16 NOTE — Telephone Encounter (Signed)
Refill sent per [facility] refill protocol/SLS Last OV: 02/25/16 ROV: 08/23/16 Patient had both Diltiazem [Cartia Xt] and Cardia XT [brand] listed active on his medication list; phone and spoke with patient for verification on correct Rx. Diltiazem discontinued and Cartia XT sent to pharmacy/SLS 04/23

## 2016-08-18 ENCOUNTER — Other Ambulatory Visit: Payer: Self-pay | Admitting: Family

## 2016-08-19 NOTE — Telephone Encounter (Signed)
Refill sent per [facility] refill protocol/SLS LOV: 02/25/16 ROV: 08/23/16

## 2016-08-21 ENCOUNTER — Other Ambulatory Visit: Payer: Self-pay | Admitting: Family

## 2016-08-23 ENCOUNTER — Ambulatory Visit (INDEPENDENT_AMBULATORY_CARE_PROVIDER_SITE_OTHER): Payer: BC Managed Care – PPO | Admitting: Family

## 2016-08-23 ENCOUNTER — Encounter: Payer: Self-pay | Admitting: Family

## 2016-08-23 VITALS — BP 126/57 | HR 60 | Temp 97.8°F | Resp 16 | Ht 64.0 in | Wt 220.2 lb

## 2016-08-23 DIAGNOSIS — Z23 Encounter for immunization: Secondary | ICD-10-CM | POA: Diagnosis not present

## 2016-08-23 DIAGNOSIS — D649 Anemia, unspecified: Secondary | ICD-10-CM

## 2016-08-23 DIAGNOSIS — K625 Hemorrhage of anus and rectum: Secondary | ICD-10-CM

## 2016-08-23 DIAGNOSIS — E785 Hyperlipidemia, unspecified: Secondary | ICD-10-CM | POA: Diagnosis not present

## 2016-08-23 DIAGNOSIS — I10 Essential (primary) hypertension: Secondary | ICD-10-CM | POA: Diagnosis not present

## 2016-08-23 DIAGNOSIS — R635 Abnormal weight gain: Secondary | ICD-10-CM

## 2016-08-23 NOTE — Telephone Encounter (Signed)
Refill sent per [facility] refill protocol/SLS LOV: 02/25/16 ROV: Today, 08/23/16

## 2016-08-23 NOTE — Patient Instructions (Signed)
Please complete lab work prior to leaving. We will work on getting you back in with Dr. Ferdinand Lango. Please let us know if you have not heard back about this appointment in 1 week. Please go to the ER if you develop large amount of blood in stool. Or if you have more than 1 bloody stool in a row.

## 2016-08-23 NOTE — Progress Notes (Signed)
Pre visit review using our clinic review tool, if applicable. No additional management support is needed unless otherwise documented below in the visit note. 

## 2016-08-23 NOTE — Progress Notes (Signed)
Subjective:    Patient ID: Keith Hardin, male    DOB: 08-23-1950, 66 y.o.   MRN: [id number]  HPI  Keith Hardin is a 66 yr old male who presents today for follow up.  1) HTN- currently maintained on clonidine, labetalol, lisinopril.  Denies CP/SOB. No swelling today but does have occasional swelling.  BP Readings from Last 3 Encounters:  08/23/16 (!) 126/57  02/25/16 132/63  10/27/15 (!) 144/78   2) Hyperlipidemia- maintained on pravastatin.  Lab Results  Component Value Date   CHOL 154 07/30/2015   HDL 52.30 07/30/2015   LDLCALC 81 07/30/2015   TRIG 105.0 07/30/2015   CHOLHDL 3 07/30/2015   3) Dm2- He is followed by Keith Hardin at [facility].  Lab Results  Component Value Date   HGBA1C 7.6 (H) 07/30/2015   HGBA1C 6.6 (H) 01/23/2015   HGBA1C 6.7 (A) 10/04/2014   Lab Results  Component Value Date   MICROALBUR <0.7 07/30/2015   LDLCALC 81 07/30/2015   CREATININE 1.18 02/25/2016   4) Anemia- reports + compliance with iron supplement.  Lab Results  Component Value Date   WBC 4.7 02/25/2016   HGB 13.0 02/25/2016   HCT 39.2 02/25/2016   MCV 82.0 02/25/2016   PLT 214.0 02/25/2016   5) Rectal Bleeding- notes 2 episodes last month of Dark red BPR. Last episode was 1 month ago.  Reports that he is working longer hours and exercising less. Notes that he a polyp which was reportedly benign per patient. Reports this was performed in the fall of last year at [facility] center.  Wt Readings from Last 3 Encounters:  08/23/16 220 lb 3.2 oz (99.9 kg)  02/25/16 215 lb 3.2 oz (97.6 kg)  10/27/15 210 lb (95.3 kg)      Review of Systems    see HPI  Past Medical History:  Diagnosis Date  . Allergy   . Colon polyps   . Diabetes mellitus    type II  . Hyperlipidemia   . Hypertension   . Hypogonadism male 05/02/2011  . Orchitis and epididymitis 01/2008   left with left hydrocele     Social History   Social History  . Marital status: Married   Spouse name: Keith Hardin  . Number of children: 0  . Years of education: N/A   Occupational History  . Unemployed Unemployed   Social History Main Topics  . Smoking status: Never Smoker  . Smokeless tobacco: Never Used  . Alcohol use No  . Drug use: No  . Sexual activity: Not on file   Other Topics Concern  . Not on file   Social History Narrative   Occupation: unemployed   Married since 1999   No children     Past Surgical History:  Procedure Laterality Date  . ACHILLES TENDON REPAIR     right    Family History  Problem Relation Age of Onset  . Seizures Mother   . Hypertension Mother   . Cancer Brother     stomach  . Colon cancer Neg Hx   . Diabetes Neg Hx   . Heart attack Neg Hx   . Hyperlipidemia Neg Hx   . Sudden death Neg Hx     No Known Allergies  Current Outpatient Prescriptions on File Prior to Visit  Medication Sig Dispense Refill  . aspirin 81 MG tablet Take 81 mg by mouth daily.      . BD PEN NEEDLE NANO U/F 32G X 4  MM MISC USE TO INJECT INSULIN 100 each 1  . bimatoprost (LUMIGAN) 0.03 % ophthalmic solution Place into both eyes daily.    Marland Kitchen CARTIA XT 180 MG 24 hr capsule TAKE 1 CAPSULE(180 MG) BY MOUTH DAILY 90 capsule 0  . cloNIDine (CATAPRES) 0.3 MG tablet Take 1 tablet (0.3 mg total) by mouth 2 (two) times daily. 180 tablet 5  . Ferrous Sulfate (IRON) 325 (65 Fe) MG TABS Take 1 tablet by mouth daily. 30 each 0  . fexofenadine (ALLEGRA) 180 MG tablet Take 180 mg by mouth daily.    . fluticasone (FLONASE) 50 MCG/ACT nasal spray Place 2 sprays into both nostrils daily.    Marland Kitchen glucose blood (ACCU-CHEK AVIVA) test strip Use to test blood sugar three times daily. 50 each 1  . hydrochlorothiazide (HYDRODIURIL) 25 MG tablet TAKE 1 TABLET(25 MG) BY MOUTH DAILY 30 tablet 5  . Insulin Glargine (BASAGLAR KWIKPEN) 100 UNIT/ML SOPN Inject 0.16 mLs (16 Units total) into the skin at bedtime. 15 mL 3  . labetalol (NORMODYNE) 200 MG tablet TAKE 1 TABLET BY MOUTH TWICE  DAILY 180 tablet 0  . Lancets (ACCU-CHEK MULTICLIX) lancets Use as directed 3 times daily to check blood sugar 102 each 2  . lisinopril (PRINIVIL,ZESTRIL) 40 MG tablet TAKE 1 TABLET(40 MG) BY MOUTH DAILY 90 tablet 0  . Omega-3 Fatty Acids (FISH OIL) 1000 MG CAPS Take 1 capsule by mouth 2 (two) times daily.    Keith Hardin Bicarbonate (ZEGERID OTC) 20-1100 MG CAPS capsule TAKE ONE CAPSULE BY MOUTH EVERY DAY BEFORE BREAKFAST 28 capsule 5  . pioglitazone (ACTOS) 30 MG tablet TAKE 1 TABLET(30 MG) BY MOUTH DAILY 90 tablet 0  . pravastatin (PRAVACHOL) 80 MG tablet TAKE 1 TABLET(80 MG) BY MOUTH DAILY 90 tablet 0  . sildenafil (VIAGRA) 25 MG tablet 1-2 tabs by mouth prior to sexual activity 10 tablet 5  . sitaGLIPtin-metformin (JANUMET) 50-1000 MG tablet Take 1 tablet by mouth 2 (two) times daily with a meal. 180 tablet 1   No current facility-administered medications on file prior to visit.     BP (!) 126/57 (BP Location: Right Arm, Cuff Size: Large)   Pulse 60   Temp 97.8 F (36.6 C) (Oral)   Resp 16   Ht 5\' 4"  (1.626 m)   Wt 220 lb 3.2 oz (99.9 kg)   SpO2 98% Comment: room air  BMI 37.80 kg/m    Objective:   Physical Exam  Constitutional: He is oriented to person, place, and time. He appears well-developed and well-nourished. No distress.  HENT:  Head: Normocephalic and atraumatic.  Cardiovascular: Normal rate and regular rhythm.   No murmur heard. Pulmonary/Chest: Effort normal and breath sounds normal. No respiratory distress. He has no wheezes. He has no rales.  Musculoskeletal: He exhibits no edema.  Neurological: He is alert and oriented to person, place, and time.  Skin: Skin is warm and dry.  Psychiatric: He has a normal mood and affect. His behavior is normal. Thought content normal.          Assessment & Plan:  Anemia- obtain CBC and serum iron.  Continue iron supplement.  Rectal bleeding- none currently. Check cbc, refer back to his GI for further evaluation.   Patient is advised as follows:   Please go to the ER if you develop large amount of blood in stool. Or if you have more than 1 bloody stool in a row.   Weight gain- discussed portion control and importance of  regular exercise such as wlaking.

## 2016-08-23 NOTE — Assessment & Plan Note (Signed)
Tolerating pravastatin. Obtain follow up lipid panel.  

## 2016-08-23 NOTE — Assessment & Plan Note (Signed)
BP stable on current meds. Continue same,obtain follow up bmet.  

## 2016-08-24 LAB — LIPID PANEL
CHOL/HDL RATIO: 3
CHOLESTEROL: 145 mg/dL (ref 0–200)
HDL: 50.7 mg/dL (ref >39.00)
LDL CALC: 70 mg/dL (ref 0–99)
NonHDL: 94.51
TRIGLYCERIDES: 121 mg/dL (ref 0.0–149.0)
VLDL: 24.2 mg/dL (ref 0.0–40.0)

## 2016-08-24 LAB — BASIC METABOLIC PANEL
BUN: 17 mg/dL (ref 6–23)
CALCIUM: 9.4 mg/dL (ref 8.4–10.5)
CO2: 27 meq/L (ref 19–32)
CREATININE: 1.11 mg/dL (ref 0.40–1.50)
Chloride: 105 mEq/L (ref 96–112)
GFR: 85.35 mL/min (ref >60.00)
Glucose, Bld: 104 mg/dL — ABNORMAL HIGH (ref 70–99)
Potassium: 3.5 mEq/L (ref 3.5–5.1)
SODIUM: 143 meq/L (ref 135–145)

## 2016-08-24 LAB — CBC WITH DIFFERENTIAL/PLATELET
BASOS ABS: 0 10*3/uL (ref 0.0–0.1)
Basophils Relative: 1.1 % (ref 0.0–3.0)
EOS ABS: 0.2 10*3/uL (ref 0.0–0.7)
Eosinophils Relative: 4.2 % (ref 0.0–5.0)
HEMATOCRIT: 39.7 % (ref 39.0–52.0)
HEMOGLOBIN: 13.3 g/dL (ref 13.0–17.0)
LYMPHS PCT: 35.7 % (ref 12.0–46.0)
Lymphs Abs: 1.6 10*3/uL (ref 0.7–4.0)
MCHC: 33.6 g/dL (ref 30.0–36.0)
MCV: 84.7 fl (ref 78.0–100.0)
MONOS PCT: 12.2 % — AB (ref 3.0–12.0)
Monocytes Absolute: 0.6 10*3/uL (ref 0.1–1.0)
Neutro Abs: 2.1 10*3/uL (ref 1.4–7.7)
Neutrophils Relative %: 46.8 % (ref 43.0–77.0)
Platelets: 222 10*3/uL (ref 150.0–400.0)
RBC: 4.69 Mil/uL (ref 4.22–5.81)
RDW: 15.1 % (ref 11.5–15.5)
WBC: 4.6 10*3/uL (ref 4.0–10.5)

## 2016-08-24 LAB — IRON: Iron: 72 ug/dL (ref 42–165)

## 2016-08-24 LAB — TSH: TSH: 1.43 u[IU]/mL (ref 0.35–4.50)

## 2016-08-27 ENCOUNTER — Encounter: Payer: Self-pay | Admitting: Family

## 2016-09-13 ENCOUNTER — Encounter: Payer: Self-pay | Admitting: Gastroenterology

## 2016-09-16 ENCOUNTER — Ambulatory Visit (INDEPENDENT_AMBULATORY_CARE_PROVIDER_SITE_OTHER): Payer: BC Managed Care – PPO | Admitting: Medical

## 2016-09-16 ENCOUNTER — Encounter: Payer: Self-pay | Admitting: Medical

## 2016-09-16 VITALS — BP 122/78 | HR 98 | Temp 98.0°F | Resp 18 | Wt 214.8 lb

## 2016-09-16 DIAGNOSIS — S1096XA Insect bite of unspecified part of neck, initial encounter: Secondary | ICD-10-CM | POA: Diagnosis not present

## 2016-09-16 DIAGNOSIS — W57XXXA Bitten or stung by nonvenomous insect and other nonvenomous arthropods, initial encounter: Secondary | ICD-10-CM | POA: Diagnosis not present

## 2016-09-16 MED ORDER — DOXYCYCLINE HYCLATE 100 MG PO TABS: 100.0000 mg | ORAL_TABLET | Freq: Two times a day (BID) | ORAL | 0 refills | Status: DC

## 2016-09-16 NOTE — Patient Instructions (Signed)
With recent tick bite along with sweats and body aches will rx doxycycline antibiotic. Has coverage for both lyme and rmsf.  I am putting in future tick bite studies to do in 2 weeks.(currently antibody may come back falsely negative).  Follow up in 2 weeks or prn.

## 2016-09-16 NOTE — Progress Notes (Signed)
Subjective:    Patient ID: Keith Hardin, male    DOB: June 27, 1950, 66 y.o.   MRN: [id number]  HPI  Pt in with recent new tick bite found  on Sunday night. He pulled it off and states was imbedded deeply. He thinks may attached on Friday but not sure. Pt states yesterday am he woke up and was sweaty as if someone poured water on him. He noticed some achiness in joints yesterday. No rash on his skin.    Review of Systems  Constitutional: Positive for diaphoresis. Negative for chills, fatigue and fever.       Other morning woke up feeling like someone dropped cup of water on his head. Unusual and state wife keeps house very cool.  Respiratory: Negative for cough, chest tightness, shortness of breath and wheezing.   Cardiovascular: Negative for chest pain and palpitations.  Gastrointestinal: Negative for abdominal pain.  Musculoskeletal: Positive for arthralgias and myalgias.       Past 3 days at work noted some unusual intermittent achiness in joints. Particularly knee which is new per pt. But not present today.  Skin: Negative for rash.  Neurological: Negative for dizziness and headaches.  Hematological: Negative for adenopathy. Does not bruise/bleed easily.  Psychiatric/Behavioral: Negative for behavioral problems and confusion. The patient is not nervous/anxious.     Past Medical History:  Diagnosis Date  . Allergy   . Colon polyps   . Diabetes mellitus    type II  . Hyperlipidemia   . Hypertension   . Hypogonadism male 05/02/2011  . Orchitis and epididymitis 01/2008   left with left hydrocele     Social History   Social History  . Marital status: Married    Spouse name: [name]  . Number of children: 0  . Years of education: N/A   Occupational History  . Unemployed Unemployed   Social History Main Topics  . Smoking status: Never Smoker  . Smokeless tobacco: Never Used  . Alcohol use No  . Drug use: No  . Sexual activity: Not on file   Other Topics Concern  .  Not on file   Social History Narrative   Occupation: unemployed   Married since 1999   No children     Past Surgical History:  Procedure Laterality Date  . ACHILLES TENDON REPAIR     right    Family History  Problem Relation Age of Onset  . Seizures Mother   . Hypertension Mother   . Cancer Brother        stomach  . Colon cancer Neg Hx   . Diabetes Neg Hx   . Heart attack Neg Hx   . Hyperlipidemia Neg Hx   . Sudden death Neg Hx     No Known Allergies  Current Outpatient Prescriptions on File Prior to Visit  Medication Sig Dispense Refill  . aspirin 81 MG tablet Take 81 mg by mouth daily.      . BD PEN NEEDLE NANO U/F 32G X 4 MM MISC USE TO INJECT INSULIN 100 each 1  . bimatoprost (LUMIGAN) 0.03 % ophthalmic solution Place into both eyes daily.    Marland Kitchen CARTIA XT 180 MG 24 hr capsule TAKE 1 CAPSULE(180 MG) BY MOUTH DAILY 90 capsule 0  . cloNIDine (CATAPRES) 0.3 MG tablet Take 1 tablet (0.3 mg total) by mouth 2 (two) times daily. 180 tablet 5  . dapagliflozin propanediol (FARXIGA) 10 MG TABS tablet Take 10 mg by mouth daily.    Marland Kitchen  Ferrous Sulfate (IRON) 325 (65 Fe) MG TABS Take 1 tablet by mouth daily. 30 each 0  . fexofenadine (ALLEGRA) 180 MG tablet Take 180 mg by mouth daily.    . fluticasone (FLONASE) 50 MCG/ACT nasal spray Place 2 sprays into both nostrils daily.    Marland Kitchen glucose blood (ACCU-CHEK AVIVA) test strip Use to test blood sugar three times daily. 50 each 1  . hydrochlorothiazide (HYDRODIURIL) 25 MG tablet TAKE 1 TABLET(25 MG) BY MOUTH DAILY 30 tablet 5  . Insulin Glargine (BASAGLAR KWIKPEN) 100 UNIT/ML SOPN Inject 0.16 mLs (16 Units total) into the skin at bedtime. 15 mL 3  . labetalol (NORMODYNE) 200 MG tablet TAKE 1 TABLET BY MOUTH TWICE DAILY 180 tablet 0  . Lancets (ACCU-CHEK MULTICLIX) lancets Use as directed 3 times daily to check blood sugar 102 each 2  . lisinopril (PRINIVIL,ZESTRIL) 40 MG tablet TAKE 1 TABLET(40 MG) BY MOUTH DAILY 90 tablet 0  . Omega-3  Fatty Acids (FISH OIL) 1000 MG CAPS Take 1 capsule by mouth 2 (two) times daily.    Earney Navy Bicarbonate (ZEGERID OTC) 20-1100 MG CAPS capsule TAKE ONE CAPSULE BY MOUTH EVERY DAY BEFORE BREAKFAST 28 capsule 5  . pioglitazone (ACTOS) 30 MG tablet TAKE 1 TABLET(30 MG) BY MOUTH DAILY 90 tablet 0  . pravastatin (PRAVACHOL) 80 MG tablet TAKE 1 TABLET(80 MG) BY MOUTH DAILY 90 tablet 0  . sildenafil (VIAGRA) 25 MG tablet 1-2 tabs by mouth prior to sexual activity 10 tablet 5  . sitaGLIPtin-metformin (JANUMET) 50-1000 MG tablet Take 1 tablet by mouth 2 (two) times daily with a meal. 180 tablet 1   No current facility-administered medications on file prior to visit.     BP 122/78 (BP Location: Left Arm, Patient Position: Sitting, Cuff Size: Normal)   Pulse 98   Temp 98 F (36.7 C) (Oral)   Resp 18   Wt 214 lb 12.8 oz (97.4 kg)   SpO2 97%   BMI 36.87 kg/m       Objective:   Physical Exam  General- No acute distress. Pleasant patient. Lungs- Clear, even and unlabored. Heart- regular rate and rhythm. Neurologic- CNII- XII grossly intact.  Skin- small raised area under rt pectoralis. No redness, no tenderness or discharge. On close inspection no residual portion of tick seen.       Assessment & Plan:  With recent tick bite along with sweats and body aches will rx doxycycline antibiotic. Has coverage for both lyme and rmsf.  I am putting in future tick bite studies to do in 2 weeks.(currently antibody may come back falsely negative).  Follow up in 2 weeks or prn.  Irena Gaydos, Percell Miller, PA-C

## 2016-09-16 NOTE — Progress Notes (Signed)
Tick bite. 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

## 2016-09-21 ENCOUNTER — Ambulatory Visit (INDEPENDENT_AMBULATORY_CARE_PROVIDER_SITE_OTHER): Payer: BC Managed Care – PPO | Admitting: Gastroenterology

## 2016-09-21 ENCOUNTER — Encounter: Payer: Self-pay | Admitting: Gastroenterology

## 2016-09-21 VITALS — BP 100/54 | HR 72 | Ht 64.0 in | Wt 217.0 lb

## 2016-09-21 DIAGNOSIS — K625 Hemorrhage of anus and rectum: Secondary | ICD-10-CM

## 2016-09-21 MED ORDER — NA SULFATE-K SULFATE-MG SULF 17.5-3.13-1.6 GM/177ML PO SOLN: 1.0000 | Freq: Once | ORAL | 0 refills | Status: AC

## 2016-09-21 NOTE — Patient Instructions (Addendum)
Please start taking citrucel (orange flavored) powder fiber supplement.  This may cause some bloating at first but that usually goes away. Begin with a small spoonful and work your way up to a large, heaping spoonful daily over a week.  You will be set up for a colonoscopy for bleeding (double prep protocol).

## 2016-09-21 NOTE — Progress Notes (Signed)
HPI: This is a  very pleasant 66 year old man  who was referred to me by Debbrah Alar, NP  to evaluate  intermittent rectal bleeding .    Chief complaint is intermittent rectal bleeding  Sees blood when he wipes after a BM. His cousin (22s) recently found to have colon cancer).  This occurs intermittently.  Will have to push/strain periodically.  Can have loose stool.  Overall weight has been stable.  He tells me he had a colonoscopy 10-12 years ago also with Dr. Ferdinand Lango, thinks a polyp was found.  Old Data Reviewed:  Colonoscopy 01/2015 Dr. Virgel Bouquet for 'colon cancer screening' prep "inadequate" description "unable to view secondary to inadequate or poor prep" findings "74mm polyp from t/c" which I assume was transverse colon. This was TA without HGD.  He was recommended to have repeat colonoscopy in 2021    Review of systems: Pertinent positive and negative review of systems were noted in the above HPI section. All other review negative.   Past Medical History:  Diagnosis Date  . Allergy   . Colon polyps   . Diabetes mellitus    type II  . Hyperlipidemia   . Hypertension   . Hypogonadism male 05/02/2011  . Orchitis and epididymitis 01/2008   left with left hydrocele    Past Surgical History:  Procedure Laterality Date  . ACHILLES TENDON REPAIR     right    Current Outpatient Prescriptions  Medication Sig Dispense Refill  . aspirin 81 MG tablet Take 81 mg by mouth daily.      . BD PEN NEEDLE NANO U/F 32G X 4 MM MISC USE TO INJECT INSULIN 100 each 1  . bimatoprost (LUMIGAN) 0.03 % ophthalmic solution Place into both eyes daily.    Marland Kitchen CARTIA XT 180 MG 24 hr capsule TAKE 1 CAPSULE(180 MG) BY MOUTH DAILY 90 capsule 0  . cloNIDine (CATAPRES) 0.3 MG tablet Take 1 tablet (0.3 mg total) by mouth 2 (two) times daily. 180 tablet 5  . dapagliflozin propanediol (FARXIGA) 10 MG TABS tablet Take 10 mg by mouth daily.    Marland Kitchen doxycycline (VIBRA-TABS) 100 MG tablet Take 1  tablet (100 mg total) by mouth 2 (two) times daily. Can give generic or caps 20 tablet 0  . Ferrous Sulfate (IRON) 325 (65 Fe) MG TABS Take 1 tablet by mouth daily. 30 each 0  . fexofenadine (ALLEGRA) 180 MG tablet Take 180 mg by mouth daily.    . fluticasone (FLONASE) 50 MCG/ACT nasal spray Place 2 sprays into both nostrils daily.    Marland Kitchen glucose blood (ACCU-CHEK AVIVA) test strip Use to test blood sugar three times daily. 50 each 1  . hydrochlorothiazide (HYDRODIURIL) 25 MG tablet TAKE 1 TABLET(25 MG) BY MOUTH DAILY 30 tablet 5  . Insulin Glargine (BASAGLAR KWIKPEN) 100 UNIT/ML SOPN Inject 0.16 mLs (16 Units total) into the skin at bedtime. 15 mL 3  . labetalol (NORMODYNE) 200 MG tablet TAKE 1 TABLET BY MOUTH TWICE DAILY 180 tablet 0  . Lancets (ACCU-CHEK MULTICLIX) lancets Use as directed 3 times daily to check blood sugar 102 each 2  . lisinopril (PRINIVIL,ZESTRIL) 40 MG tablet TAKE 1 TABLET(40 MG) BY MOUTH DAILY 90 tablet 0  . Omega-3 Fatty Acids (FISH OIL) 1000 MG CAPS Take 1 capsule by mouth 2 (two) times daily.    Earney Navy Bicarbonate (ZEGERID OTC) 20-1100 MG CAPS capsule TAKE ONE CAPSULE BY MOUTH EVERY DAY BEFORE BREAKFAST 28 capsule 5  . pioglitazone (ACTOS)  30 MG tablet TAKE 1 TABLET(30 MG) BY MOUTH DAILY 90 tablet 0  . pravastatin (PRAVACHOL) 80 MG tablet TAKE 1 TABLET(80 MG) BY MOUTH DAILY 90 tablet 0  . sildenafil (VIAGRA) 25 MG tablet 1-2 tabs by mouth prior to sexual activity 10 tablet 5  . sitaGLIPtin-metformin (JANUMET) 50-1000 MG tablet Take 1 tablet by mouth 2 (two) times daily with a meal. 180 tablet 1   No current facility-administered medications for this visit.     Allergies as of 09/21/2016  . (No Known Allergies)    Family History  Problem Relation Age of Onset  . Seizures Mother   . Hypertension Mother   . Cancer Brother        stomach  . Colon cancer Neg Hx   . Diabetes Neg Hx   . Heart attack Neg Hx   . Hyperlipidemia Neg Hx   . Sudden death Neg  Hx     Social History   Social History  . Marital status: Married    Spouse name: [name]  . Number of children: 0  . Years of education: N/A   Occupational History  . Unemployed Unemployed   Social History Main Topics  . Smoking status: Never Smoker  . Smokeless tobacco: Never Used  . Alcohol use No  . Drug use: No  . Sexual activity: Not on file   Other Topics Concern  . Not on file   Social History Narrative   Occupation: unemployed   Married since 1999   No children      Physical Exam: BP (!) 100/54   Pulse 72   Ht 5\' 4"  (1.626 m)   Wt 217 lb (98.4 kg)   BMI 37.25 kg/m  Constitutional: generally well-appearing Psychiatric: alert and oriented x3 Eyes: extraocular movements intact Mouth: oral pharynx moist, no lesions Neck: supple no lymphadenopathy Cardiovascular: heart regular rate and rhythm Lungs: clear to auscultation bilaterally Abdomen: soft, nontender, nondistended, no obvious ascites, no peritoneal signs, normal bowel sounds Extremities: no lower extremity edema bilaterally Skin: no lesions on visible extremities   Assessment and plan: 66 y.o. male with  Intermittent rectal bleeding  I reviewed his 2016 colonoscopy report from Dr. Ferdinand Lango in [facility]. I have some serious reservations about the quality of his care from Dr. Ferdinand Lango. Specifically national guidelines have documented for years that it is safest for repeat colonoscopy at 3 year interval for any adenomatous polyps removed that are greater than 1 cm. Secondly Dr. Ferdinand Lango documented an "inadequate prep" and he was unable to see the mucosa however he recommended 5 year follow-up colonoscopy.  That is also not consistent with national guidelines.  Today I explained to Mr. Hewes that I think it is unlikely that he has underlying significant neoplasm but I recommended he have repeat colonoscopy now with double prep protocol.    Please see the "Patient Instructions" section for addition details  about the plan.   Owens Loffler, MD [facility] 09/21/2016, 9:01 AM  Cc: Debbrah Alar, NP

## 2016-10-01 ENCOUNTER — Ambulatory Visit (AMBULATORY_SURGERY_CENTER): Payer: BC Managed Care – PPO | Admitting: Gastroenterology

## 2016-10-01 ENCOUNTER — Encounter: Payer: Self-pay | Admitting: Gastroenterology

## 2016-10-01 VITALS — BP 133/79 | HR 48 | Temp 98.4°F | Resp 15 | Ht 64.0 in | Wt 217.0 lb

## 2016-10-01 DIAGNOSIS — K5140 Inflammatory polyps of colon without complications: Secondary | ICD-10-CM | POA: Diagnosis not present

## 2016-10-01 DIAGNOSIS — D122 Benign neoplasm of ascending colon: Secondary | ICD-10-CM

## 2016-10-01 DIAGNOSIS — K625 Hemorrhage of anus and rectum: Secondary | ICD-10-CM | POA: Diagnosis present

## 2016-10-01 DIAGNOSIS — D124 Benign neoplasm of descending colon: Secondary | ICD-10-CM

## 2016-10-01 MED ORDER — SODIUM CHLORIDE 0.9 % IV SOLN: 500.0000 mL | INTRAVENOUS | Status: DC

## 2016-10-01 NOTE — Op Note (Signed)
[facility] Patient Name: Keith Hardin Procedure Date: 10/01/2016 10:26 AM MRN: [id number] Endoscopist: Milus Banister , MD Age: 66 Referring MD:  Date of Birth: 1950/09/13 Gender: Male Account #: [id number] Procedure:                Colonoscopy Indications:              Rectal bleeding: Colonoscopy 01/2015 Dr. Virgel Bouquet in [facility] for colon cancer screening prep was                            "inadequate...unable to view secondary to                            inadequate or poor prep" findings "43mm polyp from                            t/c" which I assume was transverse colon. This was                            TA without HGD. He was recommended to have repeat                            colonoscopy in 2021 Medicines:                Monitored Anesthesia Care Procedure:                Pre-Anesthesia Assessment:                           - Prior to the procedure, a History and Physical                            was performed, and patient medications and                            allergies were reviewed. The patient's tolerance of                            previous anesthesia was also reviewed. The risks                            and benefits of the procedure and the sedation                            options and risks were discussed with the patient.                            All questions were answered, and informed consent                            was obtained. Prior Anticoagulants: The patient has  taken no previous anticoagulant or antiplatelet                            agents. ASA Grade Assessment: II - A patient with                            mild systemic disease. After reviewing the risks                            and benefits, the patient was deemed in                            satisfactory condition to undergo the procedure.                           After obtaining informed consent, the colonoscope                             was passed under direct vision. Throughout the                            procedure, the patient's blood pressure, pulse, and                            oxygen saturations were monitored continuously. The                            Model CF-HQ190L [id number]) scope was introduced                            through the anus and advanced to the the cecum,                            identified by appendiceal orifice and ileocecal                            valve. The colonoscopy was performed without                            difficulty. The patient tolerated the procedure                            well. The quality of the bowel preparation was                            excellent. The ileocecal valve, appendiceal                            orifice, and rectum were photographed. Scope In: 10:28:14 AM Scope Out: 10:39:00 AM Scope Withdrawal Time: 0 hours 8 minutes 6 seconds  Total Procedure Duration: 0 hours 10 minutes 46 seconds  Findings:                 Two sessile polyps were found in the descending  colon and ascending colon. The polyps were 3 to 5                            mm in size. These polyps were removed with a cold                            snare. Resection and retrieval were complete.                           External and internal hemorrhoids were found. The                            hemorrhoids were small.                           The exam was otherwise without abnormality on                            direct and retroflexion views. Complications:            No immediate complications. Estimated blood loss:                            None. Estimated Blood Loss:     Estimated blood loss: none. Impression:               - Two 3 to 5 mm polyps in the descending colon and                            in the ascending colon, removed with a cold snare.                            Resected and retrieved.                            - External and internal hemorrhoids.                           - The examination was otherwise normal on direct                            and retroflexion views. Recommendation:           - Patient has a contact number available for                            emergencies. The signs and symptoms of potential                            delayed complications were discussed with the                            patient. Return to normal activities tomorrow.                            Written  discharge instructions were provided to the                            patient.                           - Resume previous diet.                           - Continue present medications.                           You will receive a letter within 2-3 weeks with the                            pathology results and my final recommendations.                           If the polyp(s) is proven to be 'pre-cancerous' on                            pathology, you will need repeat colonoscopy in 5                            years. If the polyp(s) is NOT 'precancerous' on                            pathology then you should repeat colon cancer                            screening in 10 years with colonoscopy without need                            for colon cancer screening by any method prior to                            then (including stool testing). Milus Banister, MD 10/01/2016 10:43:28 AM This report has been signed electronically.

## 2016-10-01 NOTE — Progress Notes (Signed)
Called to room to assist during endoscopic procedure.  Patient ID and intended procedure confirmed with present staff. Received instructions for my participation in the procedure from the performing physician.  

## 2016-10-01 NOTE — Patient Instructions (Signed)
YOU HAD AN ENDOSCOPIC PROCEDURE TODAY AT THE [facility]:   Refer to the procedure report that was given to you for any specific questions about what was found during the examination.  If the procedure report does not answer your questions, please call your gastroenterologist to clarify.  If you requested that your care partner not be given the details of your procedure findings, then the procedure report has been included in a sealed envelope for you to review at your convenience later.  YOU SHOULD EXPECT: Some feelings of bloating in the abdomen. Passage of more gas than usual.  Walking can help get rid of the air that was put into your GI tract during the procedure and reduce the bloating. If you had a lower endoscopy (such as a colonoscopy or flexible sigmoidoscopy) you may notice spotting of blood in your stool or on the toilet paper. If you underwent a bowel prep for your procedure, you may not have a normal bowel movement for a few days.  Please Note:  You might notice some irritation and congestion in your nose or some drainage.  This is from the oxygen used during your procedure.  There is no need for concern and it should clear up in a day or so.  SYMPTOMS TO REPORT IMMEDIATELY:   Following lower endoscopy (colonoscopy or flexible sigmoidoscopy):  Excessive amounts of blood in the stool  Significant tenderness or worsening of abdominal pains  Swelling of the abdomen that is new, acute  Fever of 100F or higher  For urgent or emergent issues, a gastroenterologist can be reached at any hour by calling [phone].   DIET:  We do recommend a small meal at first, but then you may proceed to your regular diet.  Drink plenty of fluids but you should avoid alcoholic beverages for 24 hours.  ACTIVITY:  You should plan to take it easy for the rest of today and you should NOT DRIVE or use heavy machinery until tomorrow (because of the sedation medicines used during the test).     FOLLOW UP: Our staff will call the number listed on your records the next business day following your procedure to check on you and address any questions or concerns that you may have regarding the information given to you following your procedure. If we do not reach you, we will leave a message.  However, if you are feeling well and you are not experiencing any problems, there is no need to return our call.  We will assume that you have returned to your regular daily activities without incident.  If any biopsies were taken you will be contacted by phone or by letter within the next 1-3 weeks.  Please call us at [phone] if you have not heard about the biopsies in 3 weeks.   Await for biopsy results to determine next repeat Colonoscopy screening Polyps (handout given) Hemorrhoids (handout given)   SIGNATURES/CONFIDENTIALITY: You and/or your care partner have signed paperwork which will be entered into your electronic medical record.  These signatures attest to the fact that that the information above on your After Visit Summary has been reviewed and is understood.  Full responsibility of the confidentiality of this discharge information lies with you and/or your care-partner. 

## 2016-10-01 NOTE — Progress Notes (Signed)
Report to PACU, RN, vss, BBS= Clear.  

## 2016-10-04 ENCOUNTER — Telehealth: Payer: Self-pay

## 2016-10-04 NOTE — Telephone Encounter (Signed)
  Follow up Call-  Call back number 10/01/2016  Post procedure Call Back phone  # [phone]  Permission to leave phone message Yes  Some recent data might be hidden     Patient questions:  Do you have a fever, pain , or abdominal swelling? No. Pain Score  0 *  Have you tolerated food without any problems? Yes.    Have you been able to return to your normal activities? Yes.    Do you have any questions about your discharge instructions: Diet   No. Medications  No. Follow up visit  No.  Do you have questions or concerns about your Care? No.  Actions: * If pain score is 4 or above: No action needed, pain <4.

## 2016-10-05 ENCOUNTER — Encounter: Payer: Self-pay | Admitting: Gastroenterology

## 2016-10-15 ENCOUNTER — Other Ambulatory Visit: Payer: Self-pay | Admitting: Family

## 2016-10-16 ENCOUNTER — Other Ambulatory Visit: Payer: Self-pay | Admitting: Family

## 2016-10-18 NOTE — Telephone Encounter (Signed)
Refill sent per [facility] refill protocol/SLS  

## 2016-10-30 ENCOUNTER — Other Ambulatory Visit: Payer: Self-pay | Admitting: Family

## 2016-11-08 ENCOUNTER — Telehealth: Payer: Self-pay | Admitting: Family

## 2016-11-08 NOTE — Telephone Encounter (Signed)
Caller name: Damen Windsor Relationship to patient: self Can be reached: [phone] [facility]  Reason for call: Pt called stating he has fire ant bites from Friday night. He has approx 15 bites and they are all over (arms/legs/back). He is diabetic also. Pt requesting call back to let him know if there is anything he should do for treatment.

## 2016-11-08 NOTE — Telephone Encounter (Signed)
Ok to continue benadryl prn, can also apply otc hydrocortisone cream prn. If symptoms worsen or if symptoms do not improve in the next few days, he should schedule office visit.

## 2016-11-08 NOTE — Telephone Encounter (Signed)
Notified pt and he voices understanding. 

## 2016-11-08 NOTE — Telephone Encounter (Signed)
Spoke with pt.  Denies swelling of extremities or face and denies difficulty breathing. Reports that he has multiple bites that have formed small blisters. Reports that they have been itching and he took some benadryl which seemed to help. Pt wanted to know what else he could do?  Please advise?

## 2016-11-14 ENCOUNTER — Other Ambulatory Visit: Payer: Self-pay | Admitting: Family

## 2016-11-15 ENCOUNTER — Encounter: Payer: Self-pay | Admitting: Family

## 2016-11-15 ENCOUNTER — Ambulatory Visit: Payer: BC Managed Care – PPO | Admitting: Family

## 2016-11-15 ENCOUNTER — Ambulatory Visit (INDEPENDENT_AMBULATORY_CARE_PROVIDER_SITE_OTHER): Payer: BC Managed Care – PPO | Admitting: Family

## 2016-11-15 VITALS — BP 114/48 | HR 50 | Temp 98.0°F | Resp 18 | Wt 220.8 lb

## 2016-11-15 DIAGNOSIS — E119 Type 2 diabetes mellitus without complications: Secondary | ICD-10-CM | POA: Diagnosis not present

## 2016-11-15 DIAGNOSIS — K625 Hemorrhage of anus and rectum: Secondary | ICD-10-CM | POA: Diagnosis not present

## 2016-11-15 DIAGNOSIS — I10 Essential (primary) hypertension: Secondary | ICD-10-CM

## 2016-11-15 DIAGNOSIS — E669 Obesity, unspecified: Secondary | ICD-10-CM

## 2016-11-15 MED ORDER — LABETALOL HCL 200 MG PO TABS: 200.0000 mg | ORAL_TABLET | Freq: Two times a day (BID) | ORAL | 0 refills | Status: DC

## 2016-11-15 MED ORDER — LISINOPRIL 40 MG PO TABS: ORAL_TABLET | ORAL | 0 refills | Status: DC

## 2016-11-15 MED ORDER — PRAVASTATIN SODIUM 80 MG PO TABS: ORAL_TABLET | ORAL | 0 refills | Status: DC

## 2016-11-15 MED ORDER — OMEPRAZOLE-SODIUM BICARBONATE 20-1100 MG PO CAPS: ORAL_CAPSULE | ORAL | 5 refills | Status: AC

## 2016-11-15 MED ORDER — IRON 325 (65 FE) MG PO TABS: 1.0000 | ORAL_TABLET | Freq: Every day | ORAL | 0 refills | Status: DC

## 2016-11-15 MED ORDER — DILTIAZEM HCL ER COATED BEADS 180 MG PO CP24: ORAL_CAPSULE | ORAL | 0 refills | Status: DC

## 2016-11-15 NOTE — Assessment & Plan Note (Signed)
BP stable on current meds. Continue same.  

## 2016-11-15 NOTE — Progress Notes (Signed)
Subjective:    Patient ID: Keith Hardin, male    DOB: 1950/11/21, 67 y.o.   MRN: [id number]  HPI  Keith Hardin is a 66 yr old male who presents today for follow up.  Rectal bleeding- last visit we advised him to follow back up with his gastroenterologist. He had a colonoscopy performed in June which noted 2 polyps. Both were noted to be tubular adenomas. He was noted to have internal and external hemorrhoids. He denies any further rectal bleeding.   Lab Results  Component Value Date   WBC 4.6 08/23/2016   HGB 13.3 08/23/2016   HCT 39.7 08/23/2016   MCV 84.7 08/23/2016   PLT 222.0 08/23/2016   Weight gain- his weight last visit was 220lbs in April.  Not exercising regularly.  Wt Readings from Last 3 Encounters:  11/15/16 220 lb 12.8 oz (100.2 kg)  10/01/16 217 lb (98.4 kg)  09/21/16 217 lb (98.4 kg)   DM2- continues to follow with Peri Jefferson at [place]. Reports that his A1C was 6.9 last week.    htn- maintained on labetalol, lisinopril, diltiazem. Reports mild LE edema.   BP Readings from Last 3 Encounters:  11/15/16 (!) 114/48  10/01/16 133/79  09/21/16 (!) 100/54     Review of Systems    see HPI  Past Medical History:  Diagnosis Date  . Allergy   . Colon polyps   . Diabetes mellitus    type II  . GERD (gastroesophageal reflux disease)   . Glaucoma   . Heart murmur   . Hyperlipidemia   . Hypertension   . Hypogonadism male 05/02/2011  . Orchitis and epididymitis 01/2008   left with left hydrocele     Social History   Social History  . Marital status: Married    Spouse name: [name]  . Number of children: 0  . Years of education: N/A   Occupational History  . Unemployed Unemployed   Social History Main Topics  . Smoking status: Never Smoker  . Smokeless tobacco: Never Used  . Alcohol use No  . Drug use: No  . Sexual activity: Not on file   Other Topics Concern  . Not on file   Social History Narrative   Occupation: unemployed   Married  since 1999   No children     Past Surgical History:  Procedure Laterality Date  . ACHILLES TENDON REPAIR     right    Family History  Problem Relation Age of Onset  . Seizures Mother   . Hypertension Mother   . Cancer Brother        stomach  . Colon cancer Neg Hx   . Diabetes Neg Hx   . Heart attack Neg Hx   . Hyperlipidemia Neg Hx   . Sudden death Neg Hx     No Known Allergies  Current Outpatient Prescriptions on File Prior to Visit  Medication Sig Dispense Refill  . aspirin 81 MG tablet Take 81 mg by mouth daily.      . BD PEN NEEDLE NANO U/F 32G X 4 MM MISC USE TO INJECT INSULIN 100 each 1  . bimatoprost (LUMIGAN) 0.03 % ophthalmic solution Place into both eyes daily.    Marland Kitchen CARTIA XT 180 MG 24 hr capsule TAKE 1 CAPSULE(180 MG) BY MOUTH DAILY 90 capsule 0  . cloNIDine (CATAPRES) 0.3 MG tablet TAKE 1 TABLET BY MOUTH TWICE DAILY 180 tablet 1  . dapagliflozin propanediol (FARXIGA) 10 MG TABS tablet Take  10 mg by mouth daily.    . Ferrous Sulfate (IRON) 325 (65 Fe) MG TABS Take 1 tablet by mouth daily. 30 each 0  . fexofenadine (ALLEGRA) 180 MG tablet Take 180 mg by mouth daily.    . fluticasone (FLONASE) 50 MCG/ACT nasal spray Place 2 sprays into both nostrils daily.    Marland Kitchen glucose blood (ACCU-CHEK AVIVA) test strip Use to test blood sugar three times daily. 50 each 1  . hydrochlorothiazide (HYDRODIURIL) 25 MG tablet TAKE 1 TABLET(25 MG) BY MOUTH DAILY 30 tablet 2  . Insulin Glargine (BASAGLAR KWIKPEN) 100 UNIT/ML SOPN Inject 0.16 mLs (16 Units total) into the skin at bedtime. 15 mL 3  . labetalol (NORMODYNE) 200 MG tablet TAKE 1 TABLET BY MOUTH TWICE DAILY 180 tablet 0  . Lancets (ACCU-CHEK MULTICLIX) lancets Use as directed 3 times daily to check blood sugar 102 each 2  . lisinopril (PRINIVIL,ZESTRIL) 40 MG tablet TAKE 1 TABLET(40 MG) BY MOUTH DAILY 90 tablet 0  . Omega-3 Fatty Acids (FISH OIL) 1000 MG CAPS Take 1 capsule by mouth 2 (two) times daily.    Earney Navy  Bicarbonate (ZEGERID OTC) 20-1100 MG CAPS capsule TAKE ONE CAPSULE BY MOUTH EVERY DAY BEFORE BREAKFAST 28 capsule 5  . pioglitazone (ACTOS) 30 MG tablet TAKE 1 TABLET(30 MG) BY MOUTH DAILY 90 tablet 0  . pravastatin (PRAVACHOL) 80 MG tablet TAKE 1 TABLET(80 MG) BY MOUTH DAILY 90 tablet 0  . sildenafil (VIAGRA) 25 MG tablet 1-2 tabs by mouth prior to sexual activity 10 tablet 5  . sitaGLIPtin-metformin (JANUMET) 50-1000 MG tablet Take 1 tablet by mouth 2 (two) times daily with a meal. 180 tablet 1   No current facility-administered medications on file prior to visit.     BP (!) 114/48 (BP Location: Left Arm, Patient Position: Sitting, Cuff Size: Normal)   Pulse (!) 50   Temp 98 F (36.7 C) (Oral)   Resp 18   Wt 220 lb 12.8 oz (100.2 kg)   SpO2 96%   BMI 37.90 kg/m    Objective:   Physical Exam  Constitutional: He is oriented to person, place, and time. He appears well-developed and well-nourished. No distress.  HENT:  Head: Normocephalic and atraumatic.  Cardiovascular: Normal rate and regular rhythm.   No murmur heard. Pulmonary/Chest: Effort normal and breath sounds normal. No respiratory distress. He has no wheezes. He has no rales.  Musculoskeletal: He exhibits no edema.  Neurological: He is alert and oriented to person, place, and time.  Skin: Skin is warm and dry.  Psychiatric: He has a normal mood and affect. His behavior is normal. Thought content normal.          Assessment & Plan:  Rectal bleeding- resolved. Was likely hemorrhoidal in nature.

## 2016-11-15 NOTE — Assessment & Plan Note (Signed)
Discussed importance of healthy diet and adding in some regular exercise.

## 2016-11-15 NOTE — Assessment & Plan Note (Signed)
Stable, management per endocrinology.

## 2016-11-16 ENCOUNTER — Other Ambulatory Visit: Payer: Self-pay | Admitting: Family

## 2017-01-15 ENCOUNTER — Other Ambulatory Visit: Payer: Self-pay | Admitting: Family

## 2017-02-16 ENCOUNTER — Other Ambulatory Visit: Payer: Self-pay | Admitting: Family

## 2017-02-17 NOTE — Telephone Encounter (Signed)
Rx approved and sent to the pharmacy by e-script.//AB/CMA 

## 2017-02-18 ENCOUNTER — Ambulatory Visit (INDEPENDENT_AMBULATORY_CARE_PROVIDER_SITE_OTHER): Payer: BC Managed Care – PPO | Admitting: Family

## 2017-02-18 ENCOUNTER — Encounter: Payer: Self-pay | Admitting: Family

## 2017-02-18 VITALS — BP 145/63 | HR 60 | Temp 97.8°F | Ht 64.0 in | Wt 221.8 lb

## 2017-02-18 DIAGNOSIS — Z0000 Encounter for general adult medical examination without abnormal findings: Secondary | ICD-10-CM

## 2017-02-18 DIAGNOSIS — E611 Iron deficiency: Secondary | ICD-10-CM

## 2017-02-18 LAB — CBC WITH DIFFERENTIAL/PLATELET
BASOS PCT: 0.6 % (ref 0.0–3.0)
Basophils Absolute: 0 10*3/uL (ref 0.0–0.1)
EOS ABS: 0.1 10*3/uL (ref 0.0–0.7)
Eosinophils Relative: 3.4 % (ref 0.0–5.0)
HEMATOCRIT: 41.4 % (ref 39.0–52.0)
HEMOGLOBIN: 13.6 g/dL (ref 13.0–17.0)
LYMPHS PCT: 31.9 % (ref 12.0–46.0)
Lymphs Abs: 1.4 10*3/uL (ref 0.7–4.0)
MCHC: 32.9 g/dL (ref 30.0–36.0)
MCV: 86.1 fl (ref 78.0–100.0)
Monocytes Absolute: 0.4 10*3/uL (ref 0.1–1.0)
Monocytes Relative: 8.4 % (ref 3.0–12.0)
Neutro Abs: 2.4 10*3/uL (ref 1.4–7.7)
Neutrophils Relative %: 55.7 % (ref 43.0–77.0)
Platelets: 220 10*3/uL (ref 150.0–400.0)
RBC: 4.8 Mil/uL (ref 4.22–5.81)
RDW: 15.4 % (ref 11.5–15.5)
WBC: 4.3 10*3/uL (ref 4.0–10.5)

## 2017-02-18 LAB — HEPATIC FUNCTION PANEL
ALBUMIN: 4.2 g/dL (ref 3.5–5.2)
ALT: 40 U/L (ref 0–53)
AST: 29 U/L (ref 0–37)
Alkaline Phosphatase: 48 U/L (ref 39–117)
BILIRUBIN DIRECT: 0.1 mg/dL (ref 0.0–0.3)
TOTAL PROTEIN: 6.5 g/dL (ref 6.0–8.3)
Total Bilirubin: 0.5 mg/dL (ref 0.2–1.2)

## 2017-02-18 LAB — BASIC METABOLIC PANEL
BUN: 14 mg/dL (ref 6–23)
CHLORIDE: 105 meq/L (ref 96–112)
CO2: 31 mEq/L (ref 19–32)
CREATININE: 1 mg/dL (ref 0.40–1.50)
Calcium: 9.5 mg/dL (ref 8.4–10.5)
GFR: 96.12 mL/min (ref >60.00)
Glucose, Bld: 138 mg/dL — ABNORMAL HIGH (ref 70–99)
POTASSIUM: 3.6 meq/L (ref 3.5–5.1)
SODIUM: 145 meq/L (ref 135–145)

## 2017-02-18 LAB — URINALYSIS, ROUTINE W REFLEX MICROSCOPIC
Bilirubin Urine: NEGATIVE
Hgb urine dipstick: NEGATIVE
KETONES UR: NEGATIVE
Nitrite: NEGATIVE
PH: 6.5 (ref 5.0–8.0)
RBC / HPF: NONE SEEN (ref 0–?)
SPECIFIC GRAVITY, URINE: 1.01 (ref 1.000–1.030)
TOTAL PROTEIN, URINE-UPE24: NEGATIVE
UROBILINOGEN UA: 0.2 (ref 0.0–1.0)

## 2017-02-18 LAB — LIPID PANEL
Cholesterol: 160 mg/dL (ref 0–200)
HDL: 51 mg/dL (ref >39.00)
LDL CALC: 90 mg/dL (ref 0–99)
NonHDL: 108.8
TRIGLYCERIDES: 93 mg/dL (ref 0.0–149.0)
Total CHOL/HDL Ratio: 3
VLDL: 18.6 mg/dL (ref 0.0–40.0)

## 2017-02-18 LAB — FERRITIN: Ferritin: 27.3 ng/mL (ref 22.0–322.0)

## 2017-02-18 LAB — IRON: Iron: 55 ug/dL (ref 42–165)

## 2017-02-18 LAB — PSA: PSA: 2.16 ng/mL (ref 0.10–4.00)

## 2017-02-18 LAB — TSH: TSH: 0.91 u[IU]/mL (ref 0.35–4.50)

## 2017-02-18 MED ORDER — ZOSTER VAC RECOMB ADJUVANTED 50 MCG/0.5ML IM SUSR: INTRAMUSCULAR | 1 refills | Status: DC

## 2017-02-18 NOTE — Patient Instructions (Signed)
Please complete lab work prior to leaving. A prescription has been sent to your pharmacy for the shingles vaccine.  This can be administered by the pharmacist if they have in stock.

## 2017-02-18 NOTE — Progress Notes (Signed)
Pre visit review using our clinic tool,if applicable. No additional management support is needed unless otherwise documented below in the visit note.  

## 2017-02-18 NOTE — Progress Notes (Signed)
Subjective:    Patient ID: Keith Hardin, male    DOB: 1950/06/24, 66 y.o.   MRN: [id number]  HPI  Keith Hardin is a 66 year old male who presents today for annual physical.  Patient presents today for complete physical.   Immunizations: flu shot today. Tetanus up to date, shingrix due, pneumovax/shingrix up to date Diet: stable Exercise: no Colonoscopy: 6/18 Vision: up to date Dental: up to date  Wt Readings from Last 3 Encounters:  02/18/17 221 lb 12.8 oz (100.6 kg)  11/15/16 220 lb 12.8 oz (100.2 kg)  10/01/16 217 lb (98.4 kg)     Review of Systems  Constitutional: Negative for unexpected weight change.  HENT: Negative for hearing loss and rhinorrhea.   Eyes: Negative for visual disturbance.  Respiratory: Negative for cough.   Cardiovascular: Negative for leg swelling.  Gastrointestinal: Negative for blood in stool, constipation and diarrhea.  Genitourinary: Negative for difficulty urinating, dysuria and frequency.  Musculoskeletal: Negative for arthralgias and myalgias.  Skin: Negative for rash.  Neurological: Negative for headaches.  Hematological: Negative for adenopathy.  Psychiatric/Behavioral:       Denies depression/anxiety   Past Medical History:  Diagnosis Date  . Allergy   . Colon polyps   . Diabetes mellitus    type II  . GERD (gastroesophageal reflux disease)   . Glaucoma   . Heart murmur   . Hyperlipidemia   . Hypertension   . Hypogonadism male 05/02/2011  . Orchitis and epididymitis 01/2008   left with left hydrocele     Social History   Social History  . Marital status: Married    Spouse name: [name]  . Number of children: 0  . Years of education: N/A   Occupational History  . Unemployed Unemployed   Social History Main Topics  . Smoking status: Never Smoker  . Smokeless tobacco: Never Used  . Alcohol use No  . Drug use: No  . Sexual activity: Not on file   Other Topics Concern  . Not on file   Social History Narrative   Occupation: unemployed   Married since 1999   No children     Past Surgical History:  Procedure Laterality Date  . ACHILLES TENDON REPAIR     right    Family History  Problem Relation Age of Onset  . Seizures Mother   . Hypertension Mother   . Cancer Brother        stomach  . Hypothyroidism Sister   . Hypertension Brother   . Colon cancer Neg Hx   . Diabetes Neg Hx   . Heart attack Neg Hx   . Hyperlipidemia Neg Hx   . Sudden death Neg Hx     No Known Allergies  Current Outpatient Prescriptions on File Prior to Visit  Medication Sig Dispense Refill  . aspirin 81 MG tablet Take 81 mg by mouth daily.      . BD PEN NEEDLE NANO U/F 32G X 4 MM MISC USE TO INJECT INSULIN 100 each 0  . bimatoprost (LUMIGAN) 0.03 % ophthalmic solution Place into both eyes daily.    . cloNIDine (CATAPRES) 0.3 MG tablet TAKE 1 TABLET BY MOUTH TWICE DAILY 180 tablet 1  . dapagliflozin propanediol (FARXIGA) 10 MG TABS tablet Take 10 mg by mouth daily.    Marland Kitchen diltiazem (CARTIA XT) 180 MG 24 hr capsule TAKE 1 CAPSULE(180 MG) BY MOUTH DAILY 90 capsule 0  . Ferrous Sulfate (IRON) 325 (65 Fe) MG TABS Take 1 tablet  by mouth daily. 30 each 0  . fexofenadine (ALLEGRA) 180 MG tablet Take 180 mg by mouth daily.    . fluticasone (FLONASE) 50 MCG/ACT nasal spray Place 2 sprays into both nostrils daily.    Marland Kitchen glucose blood (ACCU-CHEK AVIVA) test strip Use to test blood sugar three times daily. 50 each 1  . hydrochlorothiazide (HYDRODIURIL) 25 MG tablet TAKE 1 TABLET(25 MG) BY MOUTH DAILY 30 tablet 5  . Insulin Glargine (BASAGLAR KWIKPEN) 100 UNIT/ML SOPN Inject 0.16 mLs (16 Units total) into the skin at bedtime. 15 mL 3  . labetalol (NORMODYNE) 200 MG tablet Take 1 tablet (200 mg total) by mouth 2 (two) times daily. 180 tablet 0  . Lancets (ACCU-CHEK MULTICLIX) lancets Use as directed 3 times daily to check blood sugar 102 each 2  . lisinopril (PRINIVIL,ZESTRIL) 40 MG tablet TAKE 1 TABLET(40 MG) BY MOUTH DAILY 90  tablet 0  . Omega-3 Fatty Acids (FISH OIL) 1000 MG CAPS Take 1 capsule by mouth 2 (two) times daily.    Earney Navy Bicarbonate (ZEGERID OTC) 20-1100 MG CAPS capsule TAKE ONE CAPSULE BY MOUTH EVERY DAY BEFORE BREAKFAST 28 capsule 5  . pioglitazone (ACTOS) 30 MG tablet TAKE 1 TABLET(30 MG) BY MOUTH DAILY 90 tablet 0  . pravastatin (PRAVACHOL) 80 MG tablet TAKE 1 TABLET(80 MG) BY MOUTH DAILY 90 tablet 1  . sildenafil (VIAGRA) 25 MG tablet 1-2 tabs by mouth prior to sexual activity 10 tablet 5  . sitaGLIPtin-metformin (JANUMET) 50-1000 MG tablet Take 1 tablet by mouth 2 (two) times daily with a meal. 180 tablet 1   No current facility-administered medications on file prior to visit.     BP (!) 145/63   Pulse (!) 48   Temp 97.8 F (36.6 C) (Oral)   Ht 5\' 4"  (1.626 m)   Wt 221 lb 12.8 oz (100.6 kg)   SpO2 100%   BMI 38.07 kg/m    Objective:   Physical Exam  Physical Exam  Constitutional: He is oriented to person, place, and time. He appears well-developed and well-nourished. No distress.  HENT:  Head: Normocephalic and atraumatic.  Right Ear: Tympanic membrane and ear canal normal.  Left Ear: Tympanic membrane and ear canal normal.  Mouth/Throat: Oropharynx is clear and moist.  Eyes: Pupils are equal, round, and reactive to light. No scleral icterus.  Neck: Normal range of motion. No thyromegaly present.  Cardiovascular: Normal rate and regular rhythm.   No murmur heard. Pulmonary/Chest: Effort normal and breath sounds normal. No respiratory distress. He has no wheezes. He has no rales. He exhibits no tenderness.  Abdominal: Soft. Bowel sounds are normal. He exhibits no distension and no mass. There is no tenderness. There is no rebound and no guarding.  Musculoskeletal: He exhibits no edema.  Lymphadenopathy:    He has no cervical adenopathy.  Neurological: He is alert and oriented to person, place, and time. He has normal patellar reflexes. He exhibits normal muscle  tone. Coordination normal.  Skin: Skin is warm and dry.  Psychiatric: He has a normal mood and affect. His behavior is normal. Judgment and thought content normal.           Assessment & Plan:   Preventative care-discussed importance of healthy diet and exercise and weight loss.  Will obtain routine lab work.  Flu shot today.  Prescription has been sent to his pharmacy for the shingles vaccine.       Assessment & Plan:

## 2017-02-19 ENCOUNTER — Other Ambulatory Visit: Payer: Self-pay | Admitting: Family

## 2017-02-20 ENCOUNTER — Encounter: Payer: Self-pay | Admitting: Family

## 2017-02-21 ENCOUNTER — Telehealth: Payer: Self-pay | Admitting: Family

## 2017-02-21 MED ORDER — LABETALOL HCL 200 MG PO TABS: 200.0000 mg | ORAL_TABLET | Freq: Two times a day (BID) | ORAL | 1 refills | Status: DC

## 2017-02-21 MED ORDER — DILTIAZEM HCL ER COATED BEADS 180 MG PO CP24: ORAL_CAPSULE | ORAL | 1 refills | Status: DC

## 2017-02-21 NOTE — Telephone Encounter (Signed)
Caller name: Relation to HT:XHFS Call back number:[phone] Pharmacy:[facility]  Reason for call: pt states he saw Melissa on Friday, pt is needing rx labetalol (NORMODYNE) 200 MG tablet,diltiazem (CARTIA XT) 180 MG 24 hr capsule, pt also states that [facility] did not have the shingles vaccine, please advise

## 2017-02-21 NOTE — Telephone Encounter (Signed)
Refills sent. Advised pt he could check with other pharmacies in the area. [facility] said it may be 1 month before they get more in. Advised pt to check back with [facility] at that time as it may be after the first of the year before we get more for new starts. Pt voices understanding.

## 2017-03-14 ENCOUNTER — Ambulatory Visit: Payer: BC Managed Care – PPO | Admitting: Family

## 2017-03-15 ENCOUNTER — Encounter: Payer: Self-pay | Admitting: Family

## 2017-03-15 ENCOUNTER — Ambulatory Visit: Payer: BC Managed Care – PPO | Admitting: Family

## 2017-03-15 VITALS — BP 138/68 | HR 50 | Temp 97.8°F | Resp 16 | Ht 64.0 in | Wt 219.4 lb

## 2017-03-15 DIAGNOSIS — E119 Type 2 diabetes mellitus without complications: Secondary | ICD-10-CM

## 2017-03-15 DIAGNOSIS — E785 Hyperlipidemia, unspecified: Secondary | ICD-10-CM | POA: Diagnosis not present

## 2017-03-15 DIAGNOSIS — I10 Essential (primary) hypertension: Secondary | ICD-10-CM | POA: Diagnosis not present

## 2017-03-15 MED ORDER — CLONIDINE HCL 0.3 MG PO TABS: 0.3000 mg | ORAL_TABLET | Freq: Two times a day (BID) | ORAL | 1 refills | Status: DC

## 2017-03-15 MED ORDER — LISINOPRIL 40 MG PO TABS: ORAL_TABLET | ORAL | 1 refills | Status: DC

## 2017-03-15 MED ORDER — HYDROCHLOROTHIAZIDE 25 MG PO TABS: ORAL_TABLET | ORAL | 1 refills | Status: DC

## 2017-03-15 NOTE — Assessment & Plan Note (Signed)
Stable on statin, continue same.

## 2017-03-15 NOTE — Assessment & Plan Note (Signed)
Stable on current meds.  Continue same. 

## 2017-03-15 NOTE — Assessment & Plan Note (Signed)
Stable, management per endo.

## 2017-03-15 NOTE — Progress Notes (Signed)
Subjective:    Patient ID: Keith Hardin, male    DOB: 1950/08/01, 67 y.o.   MRN: [id number]  HPI  Mr. Keith Hardin is a 66 yr old male who presents today for follow up.  1) DM2- Sugar is being monitored by Endocrinology.  Last A1C was 6.9 Lab Results  Component Value Date   HGBA1C 7.6 (H) 07/30/2015   HGBA1C 6.6 (H) 01/23/2015   HGBA1C 6.7 (A) 10/04/2014   Lab Results  Component Value Date   MICROALBUR <0.7 07/30/2015   LDLCALC 90 02/18/2017   CREATININE 1.00 02/18/2017   2) HTN- maintained on labetalol, lisinopril, cartia.  BP Readings from Last 3 Encounters:  03/15/17 138/68  02/18/17 (!) 145/63  11/15/16 (!) 114/48   3) hyperlipidemia- maintained on pravastatin. Maintained on pravastatin.   Lab Results  Component Value Date   CHOL 160 02/18/2017   HDL 51.00 02/18/2017   LDLCALC 90 02/18/2017   TRIG 93.0 02/18/2017   CHOLHDL 3 02/18/2017      Review of Systems    see HPI  Past Medical History:  Diagnosis Date  . Allergy   . Colon polyps   . Diabetes mellitus    type II  . GERD (gastroesophageal reflux disease)   . Glaucoma   . Heart murmur   . Hyperlipidemia   . Hypertension   . Hypogonadism male 05/02/2011  . Orchitis and epididymitis 01/2008   left with left hydrocele     Social History   Socioeconomic History  . Marital status: Married    Spouse name: [name]  . Number of children: 0  . Years of education: Not on file  . Highest education level: Not on file  Social Needs  . Financial resource strain: Not on file  . Food insecurity - worry: Not on file  . Food insecurity - inability: Not on file  . Transportation needs - medical: Not on file  . Transportation needs - non-medical: Not on file  Occupational History  . Occupation: Merchandiser, retail: UNEMPLOYED  Tobacco Use  . Smoking status: Never Smoker  . Smokeless tobacco: Never Used  Substance and Sexual Activity  . Alcohol use: No  . Drug use: No  . Sexual activity: Not on file   Other Topics Concern  . 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 ACHILLES TENDON REPAIR     right    Family History  Problem Relation Age of Onset  . Seizures Mother   . Hypertension Mother   . Cancer Brother        stomach  . Hypothyroidism Sister   . Hypertension Brother   . Colon cancer Neg Hx   . Diabetes Neg Hx   . Heart attack Neg Hx   . Hyperlipidemia Neg Hx   . Sudden death Neg Hx     No Known Allergies  Current Outpatient Medications on File Prior to Visit  Medication Sig Dispense Refill  . aspirin 81 MG tablet Take 81 mg by mouth daily.      . BD PEN NEEDLE NANO U/F 32G X 4 MM MISC USE TO INJECT INSULIN 100 each 0  . bimatoprost (LUMIGAN) 0.03 % ophthalmic solution Place into both eyes daily.    . cloNIDine (CATAPRES) 0.3 MG tablet  TAKE 1 TABLET BY MOUTH TWICE DAILY 180 tablet 1  . dapagliflozin propanediol (FARXIGA) 10 MG TABS tablet Take 10 mg by mouth daily.    Marland Kitchen diltiazem (CARTIA XT) 180 MG 24 hr capsule TAKE 1 CAPSULE(180 MG) BY MOUTH DAILY 90 capsule 1  . Ferrous Sulfate (IRON) 325 (65 Fe) MG TABS Take 1 tablet by mouth daily. 30 each 0  . fexofenadine (ALLEGRA) 180 MG tablet Take 180 mg by mouth daily.    . fluticasone (FLONASE) 50 MCG/ACT nasal spray Place 2 sprays into both nostrils daily.    Marland Kitchen glucose blood (ACCU-CHEK AVIVA) test strip Use to test blood sugar three times daily. 50 each 1  . hydrochlorothiazide (HYDRODIURIL) 25 MG tablet TAKE 1 TABLET(25 MG) BY MOUTH DAILY 30 tablet 5  . Insulin Glargine (BASAGLAR KWIKPEN) 100 UNIT/ML SOPN Inject 0.16 mLs (16 Units total) into the skin at bedtime. 15 mL 3  . labetalol (NORMODYNE) 200 MG tablet Take 1 tablet (200 mg total) by mouth 2 (two) times daily. 180 tablet 1  . Lancets (ACCU-CHEK MULTICLIX) lancets Use as directed 3 times  daily to check blood sugar 102 each 2  . lisinopril (PRINIVIL,ZESTRIL) 40 MG tablet TAKE 1 TABLET(40 MG) BY MOUTH DAILY 90 tablet 0  . Omega-3 Fatty Acids (FISH OIL) 1000 MG CAPS Take 1 capsule by mouth 2 (two) times daily.    Earney Navy Bicarbonate (ZEGERID OTC) 20-1100 MG CAPS capsule TAKE ONE CAPSULE BY MOUTH EVERY DAY BEFORE BREAKFAST 28 capsule 5  . pioglitazone (ACTOS) 30 MG tablet TAKE 1 TABLET(30 MG) BY MOUTH DAILY 90 tablet 0  . pravastatin (PRAVACHOL) 80 MG tablet TAKE 1 TABLET(80 MG) BY MOUTH DAILY 90 tablet 1  . sildenafil (VIAGRA) 25 MG tablet 1-2 tabs by mouth prior to sexual activity 10 tablet 5  . sitaGLIPtin-metformin (JANUMET) 50-1000 MG tablet Take 1 tablet by mouth 2 (two) times daily with a meal. 180 tablet 1  . Zoster Vaccine Adjuvanted [facility]) injection Inject 0.5mg  IM now and repeat in 2-6 months. 0.5 mL 1   No current facility-administered medications on file prior to visit.     BP 138/68 (BP Location: Right Arm, Cuff Size: Large)   Pulse (!) 50   Temp 97.8 F (36.6 C) (Oral)   Resp 16   Ht 5\' 4"  (1.626 m)   Wt 219 lb 6.4 oz (99.5 kg)   SpO2 99%   BMI 37.66 kg/m    Objective:   Physical Exam  Constitutional: He is oriented to person, place, and time. He appears well-developed and well-nourished. No distress.  HENT:  Head: Normocephalic and atraumatic.  Cardiovascular: Normal rate and regular rhythm.  No murmur heard. Pulmonary/Chest: Effort normal and breath sounds normal. No respiratory distress. He has no wheezes. He has no rales.  Musculoskeletal: He exhibits no edema.  Neurological: He is alert and oriented to person, place, and time.  Skin: Skin is warm and dry.  Psychiatric: He has a normal mood and affect. His behavior is normal. Thought content normal.          Assessment & Plan:

## 2017-03-16 ENCOUNTER — Other Ambulatory Visit: Payer: Self-pay | Admitting: Emergency Medicine

## 2017-03-16 MED ORDER — INSULIN PEN NEEDLE 32G X 4 MM MISC: 0 refills | Status: DC

## 2017-06-15 ENCOUNTER — Other Ambulatory Visit: Payer: Self-pay | Admitting: Family

## 2017-06-20 ENCOUNTER — Other Ambulatory Visit: Payer: Self-pay | Admitting: Family Medicine

## 2017-07-11 ENCOUNTER — Telehealth: Payer: Self-pay | Admitting: *Deleted

## 2017-07-11 ENCOUNTER — Telehealth: Payer: Self-pay | Admitting: Family

## 2017-07-11 NOTE — Telephone Encounter (Signed)
Pt walked into office asking about copay care. Advised pt that we are currently out.  Copied from [place] [phone]. Topic: Inquiry >> Jul 11, 2017  8:24 AM Keith Hardin wrote: Reason for CRM: PT is asking if the office has discount cards for janumet.

## 2017-07-11 NOTE — Telephone Encounter (Signed)
Copied from [place]. Topic: Quick Communication - See Telephone Encounter >> Jul 11, 2017  4:10 PM Rosalin Hawking wrote: CRM for notification. See Telephone encounter for:  07/11/17.    Pt came in office wanting to know if we have any coupons for Janumet, (there was none in the back) so pt would like to know as soon we receive coupons of Janumet to please call pt.

## 2017-07-18 ENCOUNTER — Encounter: Payer: Self-pay | Admitting: Family

## 2017-07-18 DIAGNOSIS — H35359 Cystoid macular degeneration, unspecified eye: Secondary | ICD-10-CM | POA: Insufficient documentation

## 2017-08-14 ENCOUNTER — Other Ambulatory Visit: Payer: Self-pay | Admitting: Family

## 2017-08-22 ENCOUNTER — Other Ambulatory Visit: Payer: Self-pay | Admitting: Family

## 2017-08-22 NOTE — Telephone Encounter (Signed)
Copied from [place] [phone]. Topic: Quick Communication - Rx Refill/Question >> Aug 22, 2017 12:18 PM Waylan Rocher, Lumin L wrote: Medication: pravastatin (PRAVACHOL) 80 MG tablet Has the patient contacted their pharmacy? Yes.   (Agent: If no, request that the patient contact the pharmacy for the refill.) Preferred Pharmacy (with phone number or street name): [facility] [phone] - [place], [place] - [address] AT [place] [address] [place] [address] Phone: [phone] Fax: [phone] Agent: Please be advised that RX refills may take up to 3 business days. We ask that you follow-up with your pharmacy.

## 2017-09-12 ENCOUNTER — Encounter: Payer: Self-pay | Admitting: Family

## 2017-09-12 ENCOUNTER — Ambulatory Visit: Payer: BC Managed Care – PPO | Admitting: Family

## 2017-09-12 VITALS — BP 123/53 | HR 49 | Temp 97.9°F | Resp 16 | Ht 64.0 in | Wt 224.0 lb

## 2017-09-12 DIAGNOSIS — E785 Hyperlipidemia, unspecified: Secondary | ICD-10-CM | POA: Diagnosis not present

## 2017-09-12 DIAGNOSIS — I10 Essential (primary) hypertension: Secondary | ICD-10-CM

## 2017-09-12 DIAGNOSIS — Z794 Long term (current) use of insulin: Secondary | ICD-10-CM | POA: Diagnosis not present

## 2017-09-12 DIAGNOSIS — E119 Type 2 diabetes mellitus without complications: Secondary | ICD-10-CM

## 2017-09-12 MED ORDER — LABETALOL HCL 200 MG PO TABS
100.0000 mg | ORAL_TABLET | Freq: Two times a day (BID) | ORAL | 0 refills | Status: DC
Start: 2017-09-12 — End: 2017-10-24

## 2017-09-12 MED ORDER — BASAGLAR KWIKPEN 100 UNIT/ML ~~LOC~~ SOPN: 37.0000 [IU] | PEN_INJECTOR | Freq: Every day | SUBCUTANEOUS | 3 refills | Status: DC

## 2017-09-12 NOTE — Patient Instructions (Addendum)
Please complete lab work prior to leaving. Work on reducing carbohydrates and portion sizes and adding regular walking.  Decrease labetalol to 100mg  twice daily (1/2 tab).

## 2017-09-12 NOTE — Progress Notes (Signed)
Subjective:    Patient ID: Keith Hardin, male    DOB: May 15, 1950, 67 y.o.   MRN: [id number]  HPI   Pt is a 67 yr old male who presents today for follow up.  DM2- Report sugar 120 this AM. On farxiga, basaglar 37 units, actos,  And janumet.  Last A1C was 7.8- followed by endo, Peri Jefferson PA-C.   Lab Results  Component Value Date   HGBA1C 7.6 (H) 07/30/2015   HGBA1C 6.6 (H) 01/23/2015   HGBA1C 6.7 (A) 10/04/2014   Lab Results  Component Value Date   MICROALBUR <0.7 07/30/2015   LDLCALC 90 02/18/2017   CREATININE 1.00 02/18/2017   HTN- diltiazem, labetalol, hctz.   BP Readings from Last 3 Encounters:  09/12/17 (!) 123/53  03/15/17 138/68  02/18/17 (!) 145/63    Hyperlipidemia- maintained on pravastatin, denies myalgia.  Lab Results  Component Value Date   CHOL 160 02/18/2017   HDL 51.00 02/18/2017   LDLCALC 90 02/18/2017   TRIG 93.0 02/18/2017   CHOLHDL 3 02/18/2017     Wt Readings from Last 3 Encounters:  09/12/17 224 lb (101.6 kg)  03/15/17 219 lb 6.4 oz (99.5 kg)  02/18/17 221 lb 12.8 oz (100.6 kg)     Review of Systems Past Medical History:  Diagnosis Date  . Allergy   . Colon polyps   . Diabetes mellitus    type II  . GERD (gastroesophageal reflux disease)   . Glaucoma   . Heart murmur   . Hyperlipidemia   . Hypertension   . Hypogonadism male 05/02/2011  . Orchitis and epididymitis 01/2008   left with left hydrocele     Social History   Socioeconomic History  . Marital status: Married    Spouse name: [name]  . Number of children: 0  . Years of education: Not on file  . Highest education level: Not on file  Occupational History  . Occupation: Merchandiser, retail: UNEMPLOYED  Social Needs  . Financial resource strain: Not on file  . Food insecurity:    Worry: Not on file    Inability: Not on file  . Transportation needs:    Medical: Not on file    Non-medical: Not on file  Tobacco Use  . Smoking status: Never Smoker  .  Smokeless tobacco: Never Used  Substance and Sexual Activity  . Alcohol use: No  . Drug use: No  . Sexual activity: Not on file  Lifestyle  . Physical activity:    Days per week: Not on file    Minutes per session: Not on file  . Stress: Not on file  Relationships  . Social connections:    Talks on phone: Not on file    Gets together: Not on file    Attends religious service: Not on file    Active member of club or organization: Not on file    Attends meetings of clubs or organizations: Not on file    Relationship status: Not on file  . Intimate partner violence:    Fear of current or ex partner: Not on file    Emotionally abused: Not on file    Physically abused: Not on file    Forced sexual activity: Not on file  Other Topics Concern  . 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 ACHILLES TENDON REPAIR     right    Family History  Problem Relation Age of Onset  . Seizures Mother   . Hypertension Mother   . Cancer Brother        stomach  . Hypothyroidism Sister   . Hypertension Brother   . Colon cancer Neg Hx   . Diabetes Neg Hx   . Heart attack Neg Hx   . Hyperlipidemia Neg Hx   . Sudden death Neg Hx     No Known Allergies  Current Outpatient Medications on File Prior to Visit  Medication Sig Dispense Refill  . aspirin 81 MG tablet Take 81 mg by mouth daily.      . bimatoprost (LUMIGAN) 0.03 % ophthalmic solution Place into both eyes daily.    . cloNIDine (CATAPRES) 0.3 MG tablet Take 1 tablet (0.3 mg total) by mouth 2 (two) times daily. 180 tablet 1  . dapagliflozin propanediol (FARXIGA) 10 MG TABS tablet Take 10 mg by mouth daily.    Marland Kitchen diltiazem (CARTIA XT) 180 MG 24 hr capsule TAKE 1 CAPSULE(180 MG) BY MOUTH DAILY 90 capsule 0  . Ferrous Sulfate (IRON) 325 (65 Fe) MG TABS Take 1 tablet  by mouth daily. 30 each 0  . fexofenadine (ALLEGRA) 180 MG tablet Take 180 mg by mouth daily.    . fluticasone (FLONASE) 50 MCG/ACT nasal spray Place 2 sprays into both nostrils daily.    Marland Kitchen glucose blood (ACCU-CHEK AVIVA) test strip Use to test blood sugar three times daily. 50 each 1  . hydrochlorothiazide (HYDRODIURIL) 25 MG tablet TAKE 1 TABLET(25 MG) BY MOUTH DAILY 90 tablet 0  . Insulin Pen Needle (BD PEN NEEDLE NANO U/F) 32G X 4 MM MISC USE TO INJECT INSULIN AS DIRECTED 100 each 1  . labetalol (NORMODYNE) 200 MG tablet TAKE 1 TABLET(200 MG) BY MOUTH TWICE DAILY 180 tablet 0  . Lancets (ACCU-CHEK MULTICLIX) lancets Use as directed 3 times daily to check blood sugar 102 each 2  . lisinopril (PRINIVIL,ZESTRIL) 40 MG tablet TAKE 1 TABLET(40 MG) BY MOUTH DAILY 90 tablet 1  . Omega-3 Fatty Acids (FISH OIL) 1000 MG CAPS Take 1 capsule by mouth 2 (two) times daily.    Earney Navy Bicarbonate (ZEGERID OTC) 20-1100 MG CAPS capsule TAKE ONE CAPSULE BY MOUTH EVERY DAY BEFORE BREAKFAST 28 capsule 5  . pioglitazone (ACTOS) 30 MG tablet TAKE 1 TABLET(30 MG) BY MOUTH DAILY 90 tablet 0  . pravastatin (PRAVACHOL) 80 MG tablet TAKE 1 TABLET(80 MG) BY MOUTH DAILY 90 tablet 1  . sitaGLIPtin-metformin (JANUMET) 50-1000 MG tablet Take 1 tablet by mouth 2 (two) times daily with a meal. 180 tablet 1  . Zoster Vaccine Adjuvanted [facility]) injection Inject 0.5mg  IM now and repeat in 2-6 months. 0.5 mL 1   No current facility-administered medications on file prior to visit.     BP (!) 123/53 (BP Location: Right Arm, Patient Position: Sitting, Cuff Size: Large)   Pulse (!) 49   Temp 97.9 F (36.6 C) (Oral)   Resp 16   Ht 5\' 4"  (1.626 m)   Wt 224 lb (101.6 kg)   SpO2 99%   BMI 38.45 kg/m       Objective:   Physical Exam  Constitutional: He is oriented to person, place, and time. He appears well-developed and well-nourished. No distress.  HENT:  Head: Normocephalic and atraumatic.    Cardiovascular: Normal rate and regular rhythm.  No murmur heard. Pulmonary/Chest: Effort normal and breath sounds normal. No respiratory distress. He has no wheezes. He has no rales.  Musculoskeletal: He exhibits no edema.  Neurological: He is alert and oriented to person, place, and time.  Skin: Skin is warm and dry.  Psychiatric: He has a normal mood and affect. His behavior is normal. Thought content normal.          Assessment & Plan:  HTN- bp stable but he is a bit bradycardic. Will decrease atenolol from 200mg  bid to 100mg  bid.   DM2- uncontrolled. This is being managed by endo.  We discussed diet, exercise, weight loss.  Hyperlipidemia- LDL at goal, continue statin.

## 2017-09-13 ENCOUNTER — Encounter: Payer: Self-pay | Admitting: Family

## 2017-09-13 LAB — BASIC METABOLIC PANEL
BUN: 20 mg/dL (ref 6–23)
CHLORIDE: 105 meq/L (ref 96–112)
CO2: 28 mEq/L (ref 19–32)
Calcium: 9.3 mg/dL (ref 8.4–10.5)
Creatinine, Ser: 1.04 mg/dL (ref 0.40–1.50)
GFR: 91.71 mL/min (ref >60.00)
Glucose, Bld: 170 mg/dL — ABNORMAL HIGH (ref 70–99)
Potassium: 3.5 mEq/L (ref 3.5–5.1)
Sodium: 144 mEq/L (ref 135–145)

## 2017-09-25 ENCOUNTER — Other Ambulatory Visit: Payer: Self-pay | Admitting: Family

## 2017-10-11 ENCOUNTER — Other Ambulatory Visit: Payer: Self-pay | Admitting: Family

## 2017-10-17 ENCOUNTER — Ambulatory Visit: Payer: BC Managed Care – PPO | Admitting: Family

## 2017-10-17 VITALS — BP 121/56 | HR 47 | Temp 98.0°F | Resp 18 | Ht 64.0 in | Wt 225.6 lb

## 2017-10-17 DIAGNOSIS — R001 Bradycardia, unspecified: Secondary | ICD-10-CM

## 2017-10-17 NOTE — Progress Notes (Signed)
Subjective:    Patient ID: Keith Hardin, male    DOB: [birth year], 67 y.o.   MRN: [id number]  HPI  Pt  Presents today for follow up of his blood pressure. Last visit we decreased his labetalol from 200mg  bid to 100mg  bid due to asymptomatic bradycardia.  He denies chest pain or shortness of breath.He is currently maintained on cartia xt 180mg , labetalol 100mg  bid, catapres and lisinopril 40mg .   BP Readings from Last 3 Encounters:  10/17/17 (!) 121/56  09/12/17 (!) 123/53  03/15/17 138/68      Review of Systems    see HPI  Past Medical History:  Diagnosis Date  . Allergy   . Colon polyps   . Diabetes mellitus    type II  . GERD (gastroesophageal reflux disease)   . Glaucoma   . Heart murmur   . Hyperlipidemia   . Hypertension   . Hypogonadism male 05/02/2011  . Orchitis and epididymitis 01/2008   left with left hydrocele     Social History   Socioeconomic History  . Marital status: Married    Spouse name: [name]  . Number of children: 0  . Years of education: Not on file  . Highest education level: Not on file  Occupational History  . Occupation: Merchandiser, retail: UNEMPLOYED  Social Needs  . Financial resource strain: Not on file  . Food insecurity:    Worry: Not on file    Inability: Not on file  . Transportation needs:    Medical: Not on file    Non-medical: Not on file  Tobacco Use  . Smoking status: Never Smoker  . Smokeless tobacco: Never Used  Substance and Sexual Activity  . Alcohol use: No  . Drug use: No  . Sexual activity: Not on file  Lifestyle  . Physical activity:    Days per week: Not on file    Minutes per session: Not on file  . Stress: Not on file  Relationships  . Social connections:    Talks on phone: Not on file    Gets together: Not on file    Attends religious service: Not on file    Active member of club or organization: Not on file    Attends meetings of clubs or organizations: Not on file    Relationship status:  Not on file  . Intimate partner violence:    Fear of current or ex partner: Not on file    Emotionally abused: Not on file    Physically abused: Not on file    Forced sexual activity: Not on file  Other Topics Concern  . 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 ACHILLES TENDON REPAIR     right    Family History  Problem Relation Age of Onset  . Seizures Mother   . Hypertension Mother   . Cancer Brother        stomach  . Hypothyroidism Sister   . Hypertension Brother   . Colon cancer Neg Hx   . Diabetes Neg Hx   . Heart attack Neg Hx   . Hyperlipidemia Neg Hx   . Sudden death Neg Hx     No Known Allergies  Current Outpatient Medications on File Prior to Visit  Medication  Sig Dispense Refill  . aspirin 81 MG tablet Take 81 mg by mouth daily.      . brimonidine (ALPHAGAN) 0.2 % ophthalmic solution INSTILL 1 DROP INTO AFFECTED EYE(S) BY OPHTHALMIC ROUTE EVERY 8 HOURS  6  . cloNIDine (CATAPRES) 0.3 MG tablet TAKE 1 TABLET(0.3 MG) BY MOUTH TWICE DAILY 180 tablet 1  . dapagliflozin propanediol (FARXIGA) 10 MG TABS tablet Take 10 mg by mouth daily.    Marland Kitchen diltiazem (CARTIA XT) 180 MG 24 hr capsule TAKE 1 CAPSULE(180 MG) BY MOUTH DAILY 90 capsule 0  . dorzolamide-timolol (COSOPT) 22.3-6.8 MG/ML ophthalmic solution INSTILL 1 DROP INTO BOTH EYES TWICE A DAY  6  . Ferrous Sulfate (IRON) 325 (65 Fe) MG TABS Take 1 tablet by mouth daily. 30 each 0  . fexofenadine (ALLEGRA) 180 MG tablet Take 180 mg by mouth daily.    . fluticasone (FLONASE) 50 MCG/ACT nasal spray Place 2 sprays into both nostrils daily.    Marland Kitchen glucose blood (ACCU-CHEK AVIVA) test strip Use to test blood sugar three times daily. 50 each 1  . hydrochlorothiazide (HYDRODIURIL) 25 MG tablet TAKE 1 TABLET(25 MG) BY MOUTH DAILY 90 tablet 0  . Insulin  Glargine (BASAGLAR KWIKPEN) 100 UNIT/ML SOPN Inject 0.37 mLs (37 Units total) into the skin at bedtime. 15 mL 3  . Insulin Pen Needle (BD PEN NEEDLE NANO U/F) 32G X 4 MM MISC USE TO INJECT INSULIN AS DIRECTED 100 each 1  . ketorolac (ACULAR) 0.5 % ophthalmic solution INSTILL 1 DROP INTO AFFECTED EYE(S) 4 TIMES DAILY  1  . labetalol (NORMODYNE) 200 MG tablet Take 0.5 tablets (100 mg total) by mouth 2 (two) times daily. 180 tablet 0  . Lancets (ACCU-CHEK MULTICLIX) lancets Use as directed 3 times daily to check blood sugar 102 each 2  . lisinopril (PRINIVIL,ZESTRIL) 40 MG tablet TAKE 1 TABLET(40 MG) BY MOUTH DAILY 90 tablet 1  . Omega-3 Fatty Acids (FISH OIL) 1000 MG CAPS Take 1 capsule by mouth 2 (two) times daily.    Earney Navy Bicarbonate (ZEGERID OTC) 20-1100 MG CAPS capsule TAKE ONE CAPSULE BY MOUTH EVERY DAY BEFORE BREAKFAST 28 capsule 5  . pioglitazone (ACTOS) 30 MG tablet TAKE 1 TABLET(30 MG) BY MOUTH DAILY 90 tablet 0  . pravastatin (PRAVACHOL) 80 MG tablet TAKE 1 TABLET(80 MG) BY MOUTH DAILY 90 tablet 1  . prednisoLONE acetate (PRED FORTE) 1 % ophthalmic suspension INSTILL 1 DROP INTO BOTH EYES 4 TIMES A DAY  6  . sitaGLIPtin-metformin (JANUMET) 50-1000 MG tablet Take 1 tablet by mouth 2 (two) times daily with a meal. 180 tablet 1  . Zoster Vaccine Adjuvanted [facility]) injection Inject 0.5mg  IM now and repeat in 2-6 months. 0.5 mL 1   No current facility-administered medications on file prior to visit.     BP (!) 121/56 (BP Location: Left Arm, Cuff Size: Large)   Pulse (!) 47   Temp 98 F (36.7 C) (Oral)   Resp 18   Ht 5\' 4"  (1.626 m)   Wt 225 lb 9.6 oz (102.3 kg)   SpO2 100%   BMI 38.72 kg/m    Objective:   Physical Exam  Constitutional: He is oriented to person, place, and time. He appears well-developed and well-nourished. No distress.  HENT:  Head: Normocephalic and atraumatic.  Cardiovascular: Regular rhythm. Bradycardia present.  Murmur  heard. Pulmonary/Chest: Effort normal and breath sounds normal. No respiratory distress. He has no wheezes. He has no rales.  Neurological: He is alert and  oriented to person, place, and time.  Skin: Skin is warm and dry.  Psychiatric: He has a normal mood and affect. His behavior is normal. Thought content normal.          Assessment & Plan:  Bradycardia- no significant improvement in his heart rate with lowering of labetalol dose.  He remains asymptomatic.  At this point will discontinue his beta-blocker completely.  If heart rate remains low after discontinuation of beta-blocker will need to consider discontinuing his calcium channel blocker.  Catapres may also be lowering his heart rate is well we will need to keep this in mind.  An EKG is performed today and personally reviewed.  EKG notes sinus bradycardia with first-degree AV block.  I suspect that his heart rate will improve with these changes.  However if it does not we will consider referral to cardiology for further evaluation.

## 2017-10-17 NOTE — Patient Instructions (Signed)
Stop Labetalol

## 2017-10-24 ENCOUNTER — Encounter: Payer: Self-pay | Admitting: Family

## 2017-10-24 ENCOUNTER — Ambulatory Visit: Payer: BC Managed Care – PPO | Admitting: Family

## 2017-10-24 VITALS — BP 139/68 | HR 49 | Temp 98.3°F | Resp 16 | Ht 64.0 in | Wt 228.4 lb

## 2017-10-24 DIAGNOSIS — R001 Bradycardia, unspecified: Secondary | ICD-10-CM

## 2017-10-24 MED ORDER — AMLODIPINE BESYLATE 5 MG PO TABS: 5.0000 mg | ORAL_TABLET | Freq: Every day | ORAL | 3 refills | Status: DC

## 2017-10-24 NOTE — Patient Instructions (Signed)
Stop diltiazem, start amlodipine.

## 2017-10-24 NOTE — Progress Notes (Signed)
Subjective:    Patient ID: Keith Hardin, male    DOB: [birth year], 67 y.o.   MRN: [id number]  HPI   Patient is a 67 yr old male who presents today for follow up of his bradycardia. Last visit HR remained low despite lowering of is labetalol seedose. We discontinued labetalol. Denies fatigue, dizziness.    Review of Systems See HPI  Past Medical History:  Diagnosis Date  . Allergy   . Colon polyps   . Diabetes mellitus    type II  . GERD (gastroesophageal reflux disease)   . Glaucoma   . Heart murmur   . Hyperlipidemia   . Hypertension   . Hypogonadism male 05/02/2011  . Orchitis and epididymitis 01/2008   left with left hydrocele     Social History   Socioeconomic History  . Marital status: Married    Spouse name: [name]  . Number of children: 0  . Years of education: Not on file  . Highest education level: Not on file  Occupational History  . Occupation: Merchandiser, retail: UNEMPLOYED  Social Needs  . Financial resource strain: Not on file  . Food insecurity:    Worry: Not on file    Inability: Not on file  . Transportation needs:    Medical: Not on file    Non-medical: Not on file  Tobacco Use  . Smoking status: Never Smoker  . Smokeless tobacco: Never Used  Substance and Sexual Activity  . Alcohol use: No  . Drug use: No  . Sexual activity: Not on file  Lifestyle  . Physical activity:    Days per week: Not on file    Minutes per session: Not on file  . Stress: Not on file  Relationships  . Social connections:    Talks on phone: Not on file    Gets together: Not on file    Attends religious service: Not on file    Active member of club or organization: Not on file    Attends meetings of clubs or organizations: Not on file    Relationship status: Not on file  . Intimate partner violence:    Fear of current or ex partner: Not on file    Emotionally abused: Not on file    Physically abused: Not on file    Forced sexual activity: Not on file    Other Topics Concern  . 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 ACHILLES TENDON REPAIR     right    Family History  Problem Relation Age of Onset  . Seizures Mother   . Hypertension Mother   . Cancer Brother        stomach  . Hypothyroidism Sister   . Hypertension Brother   . Colon cancer Neg Hx   . Diabetes Neg Hx   . Heart attack Neg Hx   . Hyperlipidemia Neg Hx   . Sudden death Neg Hx     No Known Allergies  Current Outpatient Medications on File Prior to Visit  Medication Sig Dispense Refill  . aspirin 81 MG tablet Take 81 mg by mouth daily.      . brimonidine (ALPHAGAN) 0.2 % ophthalmic solution INSTILL 1 DROP INTO AFFECTED EYE(S) BY OPHTHALMIC ROUTE EVERY 8 HOURS  6  . cloNIDine (CATAPRES) 0.3 MG tablet TAKE 1 TABLET(0.3 MG) BY MOUTH TWICE DAILY 180 tablet 1  . dapagliflozin propanediol (FARXIGA) 10 MG TABS tablet Take 10 mg by mouth daily.    Marland Kitchen diltiazem (CARTIA XT) 180 MG 24 hr capsule TAKE 1 CAPSULE(180 MG) BY MOUTH DAILY 90 capsule 0  . dorzolamide-timolol (COSOPT) 22.3-6.8 MG/ML ophthalmic solution INSTILL 1 DROP INTO BOTH EYES TWICE A DAY  6  . Ferrous Sulfate (IRON) 325 (65 Fe) MG TABS Take 1 tablet by mouth daily. 30 each 0  . fexofenadine (ALLEGRA) 180 MG tablet Take 180 mg by mouth daily.    . fluticasone (FLONASE) 50 MCG/ACT nasal spray Place 2 sprays into both nostrils daily.    Marland Kitchen glucose blood (ACCU-CHEK AVIVA) test strip Use to test blood sugar three times daily. 50 each 1  . hydrochlorothiazide (HYDRODIURIL) 25 MG tablet TAKE 1 TABLET(25 MG) BY MOUTH DAILY 90 tablet 0  . Insulin Glargine (BASAGLAR KWIKPEN) 100 UNIT/ML SOPN Inject 0.37 mLs (37 Units total) into the skin at bedtime. 15 mL 3  . Insulin Pen Needle (BD PEN NEEDLE NANO U/F) 32G X 4 MM MISC USE TO INJECT INSULIN AS  DIRECTED 100 each 1  . ketorolac (ACULAR) 0.5 % ophthalmic solution INSTILL 1 DROP INTO AFFECTED EYE(S) 4 TIMES DAILY  1  . Lancets (ACCU-CHEK MULTICLIX) lancets Use as directed 3 times daily to check blood sugar 102 each 2  . lisinopril (PRINIVIL,ZESTRIL) 40 MG tablet TAKE 1 TABLET(40 MG) BY MOUTH DAILY 90 tablet 1  . Omega-3 Fatty Acids (FISH OIL) 1000 MG CAPS Take 1 capsule by mouth 2 (two) times daily.    Earney Navy Bicarbonate (ZEGERID OTC) 20-1100 MG CAPS capsule TAKE ONE CAPSULE BY MOUTH EVERY DAY BEFORE BREAKFAST 28 capsule 5  . pioglitazone (ACTOS) 30 MG tablet TAKE 1 TABLET(30 MG) BY MOUTH DAILY 90 tablet 0  . pravastatin (PRAVACHOL) 80 MG tablet TAKE 1 TABLET(80 MG) BY MOUTH DAILY 90 tablet 1  . prednisoLONE acetate (PRED FORTE) 1 % ophthalmic suspension INSTILL 1 DROP INTO BOTH EYES 4 TIMES A DAY  6  . sitaGLIPtin-metformin (JANUMET) 50-1000 MG tablet Take 1 tablet by mouth 2 (two) times daily with a meal. 180 tablet 1  . Zoster Vaccine Adjuvanted [facility]) injection Inject 0.5mg  IM now and repeat in 2-6 months. 0.5 mL 1  . labetalol (NORMODYNE) 200 MG tablet Take 0.5 tablets (100 mg total) by mouth 2 (two) times daily. (Patient not taking: Reported on 10/24/2017) 180 tablet 0   No current facility-administered medications on file prior to visit.     BP 139/68 (BP Location: Left Arm, Patient Position: Sitting, Cuff Size: Large)   Pulse (!) 49   Temp 98.3 F (36.8 C) (Oral)   Resp 16   Ht 5\' 4"  (1.626 m)   Wt 228 lb 6.4 oz (103.6 kg)   SpO2 99%   BMI 39.20 kg/m       Objective:   Physical Exam  Constitutional: He is oriented to person, place, and time. He appears well-developed and well-nourished. No distress.  HENT:  Head: Normocephalic and atraumatic.  Cardiovascular: Normal rate and regular rhythm.  No murmur heard. Pulmonary/Chest: Effort normal and breath sounds normal. No respiratory distress. He has no wheezes. He has no rales.  Musculoskeletal: He  exhibits no edema.  Neurological: He is alert and oriented to person, place, and time.  Skin: Skin is warm and dry.  Psychiatric: He has a normal mood and  affect. His behavior is normal. Thought content normal.          Assessment & Plan:  Bradycardia- Unchanged, despite discontinuation of beta blocker. Discussed need to d/c diltiazem. Will change to amlodipine, remain off of labetalol.  Will refer to cardiology for further evaluation. Follow up in 1 week for nurse visit to recheck BP and HR.

## 2017-10-25 NOTE — Progress Notes (Signed)
Referring-Keith Inda Castle NP Reason for referral-Bradycardia  HPI: 67 yo male for evaluation of bradycardia at request of Debbrah Alar NP. Echo 4/17 showed normal LV function. Recently found to by bradycardic with HR 47. Labetalol and cardizem DCed.  Patient denies dyspnea, chest pain, palpitations or syncope.  Minimal chronic pedal edema.  Current Outpatient Medications  Medication Sig Dispense Refill  . amLODipine (NORVASC) 5 MG tablet Take 1 tablet (5 mg total) by mouth daily. 30 tablet 3  . aspirin 81 MG tablet Take 81 mg by mouth daily.      . brimonidine (ALPHAGAN) 0.2 % ophthalmic solution INSTILL 1 DROP INTO AFFECTED EYE(S) BY OPHTHALMIC ROUTE EVERY 8 HOURS  6  . cloNIDine (CATAPRES) 0.3 MG tablet TAKE 1 TABLET(0.3 MG) BY MOUTH TWICE DAILY 180 tablet 1  . dapagliflozin propanediol (FARXIGA) 10 MG TABS tablet Take 10 mg by mouth daily.    . dorzolamide-timolol (COSOPT) 22.3-6.8 MG/ML ophthalmic solution INSTILL 1 DROP INTO BOTH EYES TWICE A DAY  6  . Ferrous Sulfate (IRON) 325 (65 Fe) MG TABS Take 1 tablet by mouth daily. 30 each 0  . fexofenadine (ALLEGRA) 180 MG tablet Take 180 mg by mouth daily.    . fluticasone (FLONASE) 50 MCG/ACT nasal spray Place 2 sprays into both nostrils daily.    Marland Kitchen glucose blood (ACCU-CHEK AVIVA) test strip Use to test blood sugar three times daily. 50 each 1  . hydrochlorothiazide (HYDRODIURIL) 25 MG tablet TAKE 1 TABLET(25 MG) BY MOUTH DAILY 90 tablet 0  . Insulin Glargine (BASAGLAR KWIKPEN) 100 UNIT/ML SOPN Inject 0.37 mLs (37 Units total) into the skin at bedtime. 15 mL 3  . Insulin Pen Needle (BD PEN NEEDLE NANO U/F) 32G X 4 MM MISC USE TO INJECT INSULIN AS DIRECTED 100 each 1  . ketorolac (ACULAR) 0.5 % ophthalmic solution INSTILL 1 DROP INTO AFFECTED EYE(S) 4 TIMES DAILY  1  . Lancets (ACCU-CHEK MULTICLIX) lancets Use as directed 3 times daily to check blood sugar 102 each 2  . lisinopril (PRINIVIL,ZESTRIL) 40 MG tablet TAKE 1 TABLET(40  MG) BY MOUTH DAILY 90 tablet 1  . Omega-3 Fatty Acids (FISH OIL) 1000 MG CAPS Take 1 capsule by mouth 2 (two) times daily.    Earney Navy Bicarbonate (ZEGERID OTC) 20-1100 MG CAPS capsule TAKE ONE CAPSULE BY MOUTH EVERY DAY BEFORE BREAKFAST 28 capsule 5  . pioglitazone (ACTOS) 30 MG tablet TAKE 1 TABLET(30 MG) BY MOUTH DAILY 90 tablet 0  . pravastatin (PRAVACHOL) 80 MG tablet TAKE 1 TABLET(80 MG) BY MOUTH DAILY 90 tablet 1  . prednisoLONE acetate (PRED FORTE) 1 % ophthalmic suspension INSTILL 1 DROP INTO BOTH EYES 4 TIMES A DAY  6  . sitaGLIPtin-metformin (JANUMET) 50-1000 MG tablet Take 1 tablet by mouth 2 (two) times daily with a meal. 180 tablet 1  . Zoster Vaccine Adjuvanted [facility]) injection Inject 0.5mg  IM now and repeat in 2-6 months. 0.5 mL 1   No current facility-administered medications for this visit.     No Known Allergies   Past Medical History:  Diagnosis Date  . Allergy   . Colon polyps   . Diabetes mellitus    type II  . GERD (gastroesophageal reflux disease)   . Glaucoma   . Heart murmur   . Hyperlipidemia   . Hypertension   . Hypogonadism male 05/02/2011  . Orchitis and epididymitis 01/2008   left with left hydrocele    Past Surgical History:  Procedure Laterality Date  .  ACHILLES TENDON REPAIR     right    Social History   Socioeconomic History  . Marital status: Married    Spouse name: [name]  . Number of children: 0  . Years of education: Not on file  . Highest education level: Not on file  Occupational History  . Occupation: Merchandiser, retail: UNEMPLOYED  Social Needs  . Financial resource strain: Not on file  . Food insecurity:    Worry: Not on file    Inability: Not on file  . Transportation needs:    Medical: Not on file    Non-medical: Not on file  Tobacco Use  . Smoking status: Never Smoker  . Smokeless tobacco: Never Used  Substance and Sexual Activity  . Alcohol use: No  . Drug use: No  . Sexual activity: Not on  file  Lifestyle  . Physical activity:    Days per week: Not on file    Minutes per session: Not on file  . Stress: Not on file  Relationships  . Social connections:    Talks on phone: Not on file    Gets together: Not on file    Attends religious service: Not on file    Active member of club or organization: Not on file    Attends meetings of clubs or organizations: Not on file    Relationship status: Not on file  . Intimate partner violence:    Fear of current or ex partner: Not on file    Emotionally abused: Not on file    Physically abused: Not on file    Forced sexual activity: Not on file  Other Topics Concern  . 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 Seizures Mother   . Hypertension Mother   . Cancer Brother        stomach  . Hypothyroidism Sister   . Hypertension Brother   . Colon cancer Neg Hx   . Diabetes Neg Hx   . Heart attack Neg Hx   . Hyperlipidemia Neg Hx   . Sudden death Neg Hx     ROS: no fevers or chills, productive cough, hemoptysis, dysphasia, odynophagia, melena, hematochezia, dysuria, hematuria, rash, seizure activity, orthopnea, PND, claudication. Remaining systems are negative.  Physical Exam:   Blood pressure 134/82, pulse 63, height 5\' 4"  (1.626 m), weight 228 lb (103.4 kg), SpO2 94 %.  General:  Well developed/well nourished in NAD Skin warm/dry Patient not depressed No peripheral clubbing Back-normal HEENT-normal/normal eyelids Neck supple/normal carotid upstroke bilaterally; no bruits; no JVD; no thyromegaly chest - CTA/ normal expansion CV - RRR/normal S1 and S2; no rubs or gallops;  2/6 systolic murmur; PMI nondisplaced Abdomen -NT/ND, no HSM, no mass, + bowel sounds, no bruit 2+ femoral pulses, no bruits Ext-trace edema, no chords, 2+ DP Neuro-grossly  nonfocal  ECG -10/17/17 marked sinus bradycardia with first-degree AV block personally reviewed  A/P  1 bradycardia-patient noted to be bradycardic on recent electrocardiogram.  Labetalol and Cardizem were discontinued.  Heart rate has improved and is now in the 60s.  Patient is asymptomatic.  No further intervention required.  Note clonidine can also contribute to bradycardia and can be weaned in the future if needed.  2 hypertension-blood pressure is controlled today after adding back  amlodipine.  Would follow and advance amlodipine as needed.  3 murmur-soft systolic ejection murmur on examination.  Previous echocardiogram not suggestive of valvular heart disease.  4 hyperlipidemia-continue statin.  Management per primary care.  Kirk Ruths, MD

## 2017-10-26 ENCOUNTER — Ambulatory Visit (INDEPENDENT_AMBULATORY_CARE_PROVIDER_SITE_OTHER): Payer: BC Managed Care – PPO | Admitting: Cardiology

## 2017-10-26 ENCOUNTER — Encounter: Payer: Self-pay | Admitting: Cardiology

## 2017-10-26 VITALS — BP 134/82 | HR 63 | Ht 64.0 in | Wt 228.0 lb

## 2017-10-26 DIAGNOSIS — E7800 Pure hypercholesterolemia, unspecified: Secondary | ICD-10-CM | POA: Diagnosis not present

## 2017-10-26 DIAGNOSIS — R001 Bradycardia, unspecified: Secondary | ICD-10-CM

## 2017-10-26 DIAGNOSIS — I10 Essential (primary) hypertension: Secondary | ICD-10-CM | POA: Diagnosis not present

## 2017-10-26 NOTE — Patient Instructions (Signed)
Your physician recommends that you schedule a follow-up appointment in: AS NEEDED  

## 2017-11-03 ENCOUNTER — Ambulatory Visit: Payer: BC Managed Care – PPO | Admitting: Family

## 2017-11-03 VITALS — BP 119/70 | HR 54

## 2017-11-03 DIAGNOSIS — I10 Essential (primary) hypertension: Secondary | ICD-10-CM

## 2017-11-03 DIAGNOSIS — S61233A Puncture wound without foreign body of left middle finger without damage to nail, initial encounter: Secondary | ICD-10-CM

## 2017-11-03 DIAGNOSIS — R001 Bradycardia, unspecified: Secondary | ICD-10-CM | POA: Diagnosis not present

## 2017-11-03 DIAGNOSIS — T148XXA Other injury of unspecified body region, initial encounter: Secondary | ICD-10-CM

## 2017-11-03 NOTE — Progress Notes (Signed)
CMA note reviewed and agree.  Nance Pear NP

## 2017-11-03 NOTE — Progress Notes (Signed)
Pt here for Blood pressure check per Debbrah Alar, NP  Last visit, Diltiazem was stopped due to bradycardia and amlodipine was added.  Pt currently takes: Amlodipine 5mg  once a day. Clonidine 0.3mg  twice a day HCTZ 25mg  once a day. Lisinopril 40mg  once a day.  Pt reports compliance with medication. Denies new symptoms / side effects.  BP today @ 7:09am = 119/70 HR = 54  Pt also reports that he puntured his left middle finger on an aerator spike. He has been applying neosporin and wonders if he needs a tetanus booster. Last tdap 04/06/2013.  Pt advised per PCP to continue current medications and follow up with [place] in 6 months. Appointment scheduled for 05/08/18 at 3:20pm. Pt states he has follow with endocrinology on 11/18/17.

## 2017-11-03 NOTE — Progress Notes (Signed)
Puncture wound-I personally looked at his finger. No erythema/drainage. Advised pt to call if he develops redness and drainage and to continue antibiotic ointment.  Tetanus up to date.

## 2018-01-09 ENCOUNTER — Other Ambulatory Visit: Payer: Self-pay | Admitting: Family Medicine

## 2018-01-11 ENCOUNTER — Other Ambulatory Visit: Payer: Self-pay | Admitting: Family

## 2018-01-11 DIAGNOSIS — I10 Essential (primary) hypertension: Secondary | ICD-10-CM

## 2018-02-15 ENCOUNTER — Other Ambulatory Visit: Payer: Self-pay | Admitting: Family

## 2018-02-24 ENCOUNTER — Other Ambulatory Visit: Payer: Self-pay | Admitting: Family

## 2018-03-02 ENCOUNTER — Encounter: Payer: Self-pay | Admitting: Family Medicine

## 2018-03-02 ENCOUNTER — Ambulatory Visit: Payer: BC Managed Care – PPO | Admitting: Family Medicine

## 2018-03-02 ENCOUNTER — Ambulatory Visit (INDEPENDENT_AMBULATORY_CARE_PROVIDER_SITE_OTHER): Payer: BC Managed Care – PPO

## 2018-03-02 VITALS — BP 140/78 | HR 57 | Temp 97.8°F | Ht 64.0 in | Wt 231.0 lb

## 2018-03-02 DIAGNOSIS — M79672 Pain in left foot: Secondary | ICD-10-CM

## 2018-03-02 NOTE — Patient Instructions (Addendum)
Nice to meet you  Please try the exercises  Please try ice on the area  Please follow up with me to have custom orthotics made. Please bring your shoes with you for this  I will call you with the results from today.

## 2018-03-02 NOTE — Progress Notes (Signed)
Keith Hardin - 67 y.o. male MRN [id number]  Date of birth: 1950-07-15  SUBJECTIVE:  Including CC & ROS.  Chief Complaint  Patient presents with  . Foot Pain    pt c/o left foot pain for 3x weeks , pt has not taken any meds for pain. pt describes pain as throbbing 7/10. hard to put weight on foot    Keith Hardin is a 67 y.o. male that is presenting with left foot pain.  The pain is occurring over the anterior ankle joint and navicular bone.  The pain is occurring for a few weeks.  Denies any inciting event.  Pain is worse with the first she steps in the morning.  He denies having tried anything to improve his pain.  The pain can be significant in nature.  He denies any history of gout.  Denies any history of similar pain.  Pain is sharp and stabbing.  Pain is localized to this area.  Has some swelling at the end of the day.    Review of Systems  Constitutional: Negative for fever.  HENT: Negative for congestion.   Respiratory: Negative for cough.   Cardiovascular: Negative for chest pain.  Gastrointestinal: Negative for abdominal pain.  Musculoskeletal: Positive for arthralgias and gait problem.  Skin: Negative for color change.  Neurological: Negative for weakness.  Hematological: Negative for adenopathy.  Psychiatric/Behavioral: Negative for agitation.    HISTORY: Past Medical, Surgical, Social, and Family History Reviewed & Updated per EMR.   Pertinent Historical Findings include:  Past Medical History:  Diagnosis Date  . Allergy   . Colon polyps   . Diabetes mellitus    type II  . GERD (gastroesophageal reflux disease)   . Glaucoma   . Heart murmur   . Hyperlipidemia   . Hypertension   . Hypogonadism male 05/02/2011  . Orchitis and epididymitis 01/2008   left with left hydrocele    Past Surgical History:  Procedure Laterality Date  . ACHILLES TENDON REPAIR     right    No Known Allergies  Family History  Problem Relation Age of Onset  . Seizures Mother    . Hypertension Mother   . Cancer Brother        stomach  . Hypothyroidism Sister   . Hypertension Brother   . Colon cancer Neg Hx   . Diabetes Neg Hx   . Heart attack Neg Hx   . Hyperlipidemia Neg Hx   . Sudden death Neg Hx      Social History   Socioeconomic History  . Marital status: Married    Spouse name: [name]  . Number of children: 0  . Years of education: Not on file  . Highest education level: Not on file  Occupational History  . Occupation: Merchandiser, retail: UNEMPLOYED  Social Needs  . Financial resource strain: Not on file  . Food insecurity:    Worry: Not on file    Inability: Not on file  . Transportation needs:    Medical: Not on file    Non-medical: Not on file  Tobacco Use  . Smoking status: Never Smoker  . Smokeless tobacco: Never Used  Substance and Sexual Activity  . Alcohol use: No  . Drug use: No  . Sexual activity: Not on file  Lifestyle  . Physical activity:    Days per week: Not on file    Minutes per session: Not on file  . Stress: Not on file  Relationships  .  Social connections:    Talks on phone: Not on file    Gets together: Not on file    Attends religious service: Not on file    Active member of club or organization: Not on file    Attends meetings of clubs or organizations: Not on file    Relationship status: Not on file  . Intimate partner violence:    Fear of current or ex partner: Not on file    Emotionally abused: Not on file    Physically abused: Not on file    Forced sexual activity: Not on file  Other Topics Concern  . 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)    Married since 1999   No children    Enjoys church     PHYSICAL EXAM:  VS: BP 140/78 (BP Location: Right Arm, Patient Position: Sitting, Cuff Size: Large)   Pulse (!) 57   Temp 97.8 F (36.6 C) (Oral)   Ht 5\' 4"  (1.626 m)   Wt 231 lb (104.8 kg)   SpO2 95%   BMI 39.65 kg/m    Physical Exam Gen: NAD, alert, cooperative with exam, well-appearing ENT: normal lips, normal nasal mucosa,  Eye: normal EOM, normal conjunctiva and lids CV:  no edema, +2 pedal pulses   Resp: no accessory muscle use, non-labored,  GI: no masses or tenderness, no hernia  Skin: no rashes, no areas of induration  Neuro: normal tone, normal sensation to touch Psych:  normal insight, alert and oriented MSK:  Left foot: Pes planus Tenderness to palpation over the navicular and anterior ankle joint. Normal range of motion. Normal strength resistance. Significant loss of the longitudinal arch. Neurovascular intact     ASSESSMENT & PLAN:   Left foot pain Symptoms are most suggestive for an impingement due to structural changes of his foot.  Does not appear to be gout at this time.  Possible for posterior tibialis tendinitis. -X-ray. -Provided Pennsaid samples. -We will have a follow-up to have custom orthotics placed. -If no improvement consider physical therapy or ultrasound.

## 2018-03-02 NOTE — Assessment & Plan Note (Signed)
Symptoms are most suggestive for an impingement due to structural changes of his foot.  Does not appear to be gout at this time.  Possible for posterior tibialis tendinitis. -X-ray. -Provided Pennsaid samples. -We will have a follow-up to have custom orthotics placed. -If no improvement consider physical therapy or ultrasound.

## 2018-03-09 ENCOUNTER — Telehealth: Payer: Self-pay | Admitting: Family Medicine

## 2018-03-09 NOTE — Telephone Encounter (Signed)
Informed of results.   Rosemarie Ax, MD [facility] 03/09/2018, 4:39 PM

## 2018-03-16 ENCOUNTER — Ambulatory Visit: Payer: BC Managed Care – PPO | Admitting: Internal Medicine

## 2018-03-16 ENCOUNTER — Other Ambulatory Visit: Payer: Self-pay

## 2018-03-16 ENCOUNTER — Other Ambulatory Visit: Payer: Self-pay | Admitting: Family

## 2018-03-16 ENCOUNTER — Encounter: Payer: Self-pay | Admitting: Internal Medicine

## 2018-03-16 VITALS — BP 112/72 | HR 66 | Temp 97.8°F | Ht 64.0 in | Wt 233.0 lb

## 2018-03-16 DIAGNOSIS — J40 Bronchitis, not specified as acute or chronic: Secondary | ICD-10-CM

## 2018-03-16 MED ORDER — BENZONATATE 200 MG PO CAPS: 200.0000 mg | ORAL_CAPSULE | Freq: Three times a day (TID) | ORAL | 0 refills | Status: DC | PRN

## 2018-03-16 MED ORDER — AZITHROMYCIN 250 MG PO TABS: ORAL_TABLET | ORAL | 0 refills | Status: DC

## 2018-03-16 MED ORDER — AZELASTINE HCL 0.1 % NA SOLN: 2.0000 | Freq: Every evening | NASAL | 3 refills | Status: DC | PRN

## 2018-03-16 NOTE — Progress Notes (Signed)
Subjective:    Patient ID: Keith Hardin, male    DOB: [birth year], 67 y.o.   MRN: [id number]  DOS:  03/16/2018 Type of visit - description : acute Symptoms of started 7 to 10 days ago with cough, worse at night, + greenish sputum production. Taking OTC Mucinex without much help.   Review of Systems Denies fever chills but last night was very sweaty. + Runny nose, mild sore throat. No headache, nausea, vomiting. No unusual aches or pains No history of asthma, patient is a non-smoker.  Past Medical History:  Diagnosis Date  . Allergy   . Colon polyps   . Diabetes mellitus    type II  . GERD (gastroesophageal reflux disease)   . Glaucoma   . Heart murmur   . Hyperlipidemia   . Hypertension   . Hypogonadism male 05/02/2011  . Orchitis and epididymitis 01/2008   left with left hydrocele    Past Surgical History:  Procedure Laterality Date  . ACHILLES TENDON REPAIR     right    Social History   Socioeconomic History  . Marital status: Married    Spouse name: [name]  . Number of children: 0  . Years of education: Not on file  . Highest education level: Not on file  Occupational History  . Occupation: Merchandiser, retail: UNEMPLOYED  Social Needs  . Financial resource strain: Not on file  . Food insecurity:    Worry: Not on file    Inability: Not on file  . Transportation needs:    Medical: Not on file    Non-medical: Not on file  Tobacco Use  . Smoking status: Never Smoker  . Smokeless tobacco: Never Used  Substance and Sexual Activity  . Alcohol use: No  . Drug use: No  . Sexual activity: Not on file  Lifestyle  . Physical activity:    Days per week: Not on file    Minutes per session: Not on file  . Stress: Not on file  Relationships  . Social connections:    Talks on phone: Not on file    Gets together: Not on file    Attends religious service: Not on file    Active member of club or organization: Not on file    Attends meetings of clubs or  organizations: Not on file    Relationship status: Not on file  . Intimate partner violence:    Fear of current or ex partner: Not on file    Emotionally abused: Not on file    Physically abused: Not on file    Forced sexual activity: Not on file  Other Topics Concern  . 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 Always use your most recent med list.          accu-chek multiclix lancets Use as directed 3 times daily to check blood sugar   amLODipine 5 MG tablet Commonly known as:  NORVASC TAKE 1 TABLET(5 MG) BY MOUTH DAILY   aspirin 81 MG tablet Take 81 mg by mouth daily.   azelastine 0.1 % nasal spray Commonly known as:  ASTELIN Place 2 sprays into  both nostrils at bedtime as needed for rhinitis. Use in each nostril as directed   azithromycin 250 MG tablet Commonly known as:  ZITHROMAX 2 tabs a day the first day, then 1 tab a day x 4 days   BASAGLAR KWIKPEN 100 UNIT/ML Sopn Inject 0.37 mLs (37 Units total) into the skin at bedtime.   benzonatate 200 MG capsule Commonly known as:  TESSALON Take 1 capsule (200 mg total) by mouth 3 (three) times daily as needed for cough.   bimatoprost 0.03 % ophthalmic solution Commonly known as:  LUMIGAN   brimonidine 0.2 % ophthalmic solution Commonly known as:  ALPHAGAN INSTILL 1 DROP INTO AFFECTED EYE(S) BY OPHTHALMIC ROUTE EVERY 8 HOURS   CARTIA XT 180 MG 24 hr capsule Generic drug:  diltiazem   cloNIDine 0.3 MG tablet Commonly known as:  CATAPRES TAKE 1 TABLET(0.3 MG) BY MOUTH TWICE DAILY   dapagliflozin propanediol 10 MG Tabs tablet Commonly known as:  FARXIGA Take 10 mg by mouth daily.   dorzolamide-timolol 22.3-6.8 MG/ML ophthalmic solution Commonly known as:  COSOPT INSTILL 1  DROP INTO BOTH EYES TWICE A DAY   fexofenadine 180 MG tablet Commonly known as:  ALLEGRA Take 180 mg by mouth daily.   Fish Oil 1000 MG Caps Take 1 capsule by mouth 2 (two) times daily.   fluticasone 50 MCG/ACT nasal spray Commonly known as:  FLONASE Place 2 sprays into both nostrils daily.   glucose blood test strip Use to test blood sugar three times daily.   hydrochlorothiazide 25 MG tablet Commonly known as:  HYDRODIURIL TAKE 1 TABLET(25 MG) BY MOUTH DAILY   Insulin Pen Needle 32G X 4 MM Misc USE TO INJECT INSULIN AS DIRECTED   Iron 325 (65 Fe) MG Tabs Take 1 tablet by mouth daily.   ketorolac 0.5 % ophthalmic solution Commonly known as:  ACULAR INSTILL 1 DROP INTO AFFECTED EYE(S) 4 TIMES DAILY   lisinopril 40 MG tablet Commonly known as:  PRINIVIL,ZESTRIL TAKE 1 TABLET(40 MG) BY MOUTH DAILY   LOTEMAX 0.5 % Gel Generic drug:  Loteprednol Etabonate INSTILL 1 DROP INTO BOTH EYES TWICE A DAY   Omeprazole-Sodium Bicarbonate 20-1100 MG Caps capsule Commonly known as:  ZEGERID TAKE ONE CAPSULE BY MOUTH EVERY DAY BEFORE BREAKFAST   OVER THE COUNTER MEDICATION NATURE'S FORMULA HEALING HERBS TEA for healthy living.  1/2 teaspoon daily in water.   OVER THE COUNTER MEDICATION NATURE'S FORMULA.  CLEANSING HERBS for healthy living.  1/2 teaspoon daily in water.   pioglitazone 30 MG tablet Commonly known as:  ACTOS TAKE 1 TABLET(30 MG) BY MOUTH DAILY   pravastatin 80 MG tablet Commonly known as:  PRAVACHOL TAKE 1 TABLET(80 MG) BY MOUTH DAILY   prednisoLONE acetate 1 % ophthalmic suspension Commonly known as:  PRED FORTE INSTILL 1 DROP INTO BOTH EYES 4 TIMES A DAY   sitaGLIPtin-metformin 50-1000 MG tablet Commonly known as:  JANUMET Take 1 tablet by mouth 2 (two) times daily with a meal.   TRESIBA FLEXTOUCH 100 UNIT/ML Sopn FlexTouch Pen Generic drug:  insulin degludec           Objective:   Physical Exam BP 112/72 (BP Location: Left Arm, Patient  Position: Sitting, Cuff Size: Large)   Pulse 66   Temp 97.8 F (36.6 C)   Ht 5\' 4"  (1.626 m)   Wt 233 lb (105.7 kg)   SpO2 97%   BMI 39.99 kg/m  General:   Well developed, NAD, BMI noted. HEENT:  Normocephalic .  Face symmetric, atraumatic.  TMs bulge bilaterally but not red.  Nose congested, sinuses no TTP.  Throat symmetric and not red Lungs:  Minimal rhonchi with cough. Normal respiratory effort, no intercostal retractions, no accessory muscle use. Heart: RRR,  no murmur.  No pretibial edema bilaterally  Skin: Not pale. Not jaundice Neurologic:  alert & oriented X3.  Speech normal, gait appropriate for age and unassisted Psych--  Cognition and judgment appear intact.  Cooperative with normal attention span and concentration.  Behavior appropriate. No anxious or depressed appearing.      Assessment & Plan:   67 year old gentleman.  PMH includes diabetes, HTN, high cholesterol, morbid obesity, mild aortic stenosis.  Presents with. Bronchitis: Not responding to supportive treatment for 7 to 10 days. Recommend to change Mucinex to Mucinex DM, start Astelin, Tessalon Perles and a Z-Pak. Call if not gradually improving, see AVS

## 2018-03-16 NOTE — Patient Instructions (Signed)
Rest, fluids , tylenol  For cough:  Take Mucinex DM twice a day as needed until better Also Tessalon Perles as prescribed   For nasal congestion: Use ASTELIN a prescribed spray : 2 nasal sprays on each side of the nose at night until you feel better  Avoid decongestants such as  Pseudoephedrine or phenylephrine    Take the antibiotic as prescribed  (zithromax)  Call if not gradually better over the next  10 days  Call anytime if the symptoms are severe

## 2018-03-26 ENCOUNTER — Other Ambulatory Visit: Payer: Self-pay | Admitting: Family

## 2018-04-14 ENCOUNTER — Other Ambulatory Visit: Payer: Self-pay | Admitting: Family

## 2018-04-14 DIAGNOSIS — I10 Essential (primary) hypertension: Secondary | ICD-10-CM

## 2018-05-08 ENCOUNTER — Encounter: Payer: Self-pay | Admitting: Family

## 2018-05-08 ENCOUNTER — Ambulatory Visit: Payer: BC Managed Care – PPO | Admitting: Family

## 2018-05-08 VITALS — BP 127/60 | HR 52 | Temp 97.7°F | Resp 16 | Ht 64.0 in | Wt 229.0 lb

## 2018-05-08 DIAGNOSIS — I10 Essential (primary) hypertension: Secondary | ICD-10-CM | POA: Diagnosis not present

## 2018-05-08 DIAGNOSIS — E785 Hyperlipidemia, unspecified: Secondary | ICD-10-CM | POA: Diagnosis not present

## 2018-05-08 DIAGNOSIS — E119 Type 2 diabetes mellitus without complications: Secondary | ICD-10-CM

## 2018-05-08 MED ORDER — AMLODIPINE BESYLATE 5 MG PO TABS: ORAL_TABLET | ORAL | 1 refills | Status: DC

## 2018-05-08 NOTE — Progress Notes (Signed)
Subjective:    Patient ID: Keith Hardin, male    DOB: Oct 08, 1950, 68 y.o.   MRN: [id number]  HPI  Patient is a 68 yr old male who presents today for follow up.  HTN- bp meds include clonidine, hctz, amlodipine and lisinopril.   BP Readings from Last 3 Encounters:  03/16/18 112/72  03/02/18 140/78  11/03/17 119/70   DM2- diabetes has been monitored by [facility].  Last visit on 7/26 A1C was 7.1. Has follow up at the end of the month and reports sugars have been running 90-110.   Lab Results  Component Value Date   HGBA1C 7.6 (H) 07/30/2015   HGBA1C 6.6 (H) 01/23/2015   HGBA1C 6.7 (A) 10/04/2014   Lab Results  Component Value Date   MICROALBUR <0.7 07/30/2015   [place] 90 02/18/2017   CREATININE 1.04 09/12/2017    GERD- maintained on zegerid.  Reports symptoms are stable on current med.   Hyperlipidemia- maintained on pravastatin.   Lab Results  Component Value Date   CHOL 160 02/18/2017   HDL 51.00 02/18/2017   LDLCALC 90 02/18/2017   TRIG 93.0 02/18/2017   CHOLHDL 3 02/18/2017    Review of Systems See HPI  Past Medical History:  Diagnosis Date  . Allergy   . Colon polyps   . Diabetes mellitus    type II  . GERD (gastroesophageal reflux disease)   . Glaucoma   . Heart murmur   . Hyperlipidemia   . Hypertension   . Hypogonadism male 05/02/2011  . Orchitis and epididymitis 01/2008   left with left hydrocele     Social History   Socioeconomic History  . Marital status: Married    Spouse name: [name]  . Number of children: 0  . Years of education: Not on file  . Highest education level: Not on file  Occupational History  . Occupation: Merchandiser, retail: UNEMPLOYED  Social Needs  . Financial resource strain: Not on file  . Food insecurity:    Worry: Not on file    Inability: Not on file  . Transportation needs:    Medical: Not on file    Non-medical: Not on file  Tobacco Use  . Smoking status: Never Smoker  . Smokeless tobacco:  Never Used  Substance and Sexual Activity  . Alcohol use: No  . Drug use: No  . Sexual activity: Not on file  Lifestyle  . Physical activity:    Days per week: Not on file    Minutes per session: Not on file  . Stress: Not on file  Relationships  . Social connections:    Talks on phone: Not on file    Gets together: Not on file    Attends religious service: Not on file    Active member of club or organization: Not on file    Attends meetings of clubs or organizations: Not on file    Relationship status: Not on file  . Intimate partner violence:    Fear of current or ex partner: Not on file    Emotionally abused: Not on file    Physically abused: Not on file    Forced sexual activity: Not on file  Other Topics Concern  . 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  ACHILLES TENDON REPAIR     right    Family History  Problem Relation Age of Onset  . Seizures Mother   . Hypertension Mother   . Cancer Brother        stomach  . Hypothyroidism Sister   . Hypertension Brother   . Colon cancer Neg Hx   . Diabetes Neg Hx   . Heart attack Neg Hx   . Hyperlipidemia Neg Hx   . Sudden death Neg Hx     No Known Allergies  Current Outpatient Medications on File Prior to Visit  Medication Sig Dispense Refill  . amLODipine (NORVASC) 5 MG tablet TAKE 1 TABLET(5 MG) BY MOUTH DAILY 30 tablet 1  . aspirin 81 MG tablet Take 81 mg by mouth daily.      Marland Kitchen azelastine (ASTELIN) 0.1 % nasal spray Place 2 sprays into both nostrils at bedtime as needed for rhinitis. Use in each nostril as directed 30 mL 3  . bimatoprost (LUMIGAN) 0.03 % ophthalmic solution     . brimonidine (ALPHAGAN) 0.2 % ophthalmic solution INSTILL 1 DROP INTO AFFECTED EYE(S) BY OPHTHALMIC ROUTE EVERY 8 HOURS  6  . cloNIDine (CATAPRES) 0.3 MG tablet  TAKE 1 TABLET(0.3 MG) BY MOUTH TWICE DAILY 180 tablet 1  . dapagliflozin propanediol (FARXIGA) 10 MG TABS tablet Take 10 mg by mouth daily.    Marland Kitchen diltiazem (CARTIA XT) 180 MG 24 hr capsule     . dorzolamide-timolol (COSOPT) 22.3-6.8 MG/ML ophthalmic solution INSTILL 1 DROP INTO BOTH EYES TWICE A DAY  6  . Ferrous Sulfate (IRON) 325 (65 Fe) MG TABS Take 1 tablet by mouth daily. 30 each 0  . fexofenadine (ALLEGRA) 180 MG tablet Take 180 mg by mouth daily.    . fluticasone (FLONASE) 50 MCG/ACT nasal spray Place 2 sprays into both nostrils daily.    Marland Kitchen glucose blood (ACCU-CHEK AVIVA) test strip Use to test blood sugar three times daily. 50 each 1  . hydrochlorothiazide (HYDRODIURIL) 25 MG tablet TAKE 1 TABLET(25 MG) BY MOUTH DAILY 90 tablet 1  . Insulin Glargine (BASAGLAR KWIKPEN) 100 UNIT/ML SOPN Inject 0.37 mLs (37 Units total) into the skin at bedtime. 15 mL 3  . Insulin Pen Needle (BD PEN NEEDLE NANO U/F) 32G X 4 MM MISC USE TO INJECT INSULIN AS DIRECTED 100 each 4  . ketorolac (ACULAR) 0.5 % ophthalmic solution INSTILL 1 DROP INTO AFFECTED EYE(S) 4 TIMES DAILY  1  . Lancets (ACCU-CHEK MULTICLIX) lancets Use as directed 3 times daily to check blood sugar 102 each 2  . lisinopril (PRINIVIL,ZESTRIL) 40 MG tablet TAKE 1 TABLET(40 MG) BY MOUTH DAILY 90 tablet 1  . Loteprednol Etabonate (LOTEMAX) 0.5 % GEL INSTILL 1 DROP INTO BOTH EYES TWICE A DAY    . Omega-3 Fatty Acids (FISH OIL) 1000 MG CAPS Take 1 capsule by mouth 2 (two) times daily.    Earney Navy Bicarbonate (ZEGERID OTC) 20-1100 MG CAPS capsule TAKE ONE CAPSULE BY MOUTH EVERY DAY BEFORE BREAKFAST 28 capsule 5  . OVER THE COUNTER MEDICATION NATURE'S FORMULA HEALING HERBS TEA for healthy living.  1/2 teaspoon daily in water.    Marland Kitchen OVER THE COUNTER MEDICATION NATURE'S FORMULA.  CLEANSING HERBS for healthy living.  1/2 teaspoon daily in water.    . pioglitazone (ACTOS) 30 MG tablet TAKE 1 TABLET(30 MG) BY MOUTH DAILY 90 tablet 0  .  pravastatin (PRAVACHOL) 80 MG tablet TAKE 1 TABLET(80 MG) BY MOUTH DAILY 90 tablet 1  . prednisoLONE  acetate (PRED FORTE) 1 % ophthalmic suspension INSTILL 1 DROP INTO BOTH EYES 4 TIMES A DAY  6  . sitaGLIPtin-metformin (JANUMET) 50-1000 MG tablet Take 1 tablet by mouth 2 (two) times daily with a meal. 180 tablet 1  . TRESIBA FLEXTOUCH 100 UNIT/ML SOPN FlexTouch Pen   2   No current facility-administered medications on file prior to visit.     BP 127/60 (BP Location: Right Arm, Patient Position: Sitting, Cuff Size: Large)   Pulse (!) 52   Temp 97.7 F (36.5 C) (Oral)   Resp 16   Ht 5\' 4"  (1.626 m)   Wt 229 lb (103.9 kg)   SpO2 99%   BMI 39.31 kg/m       Objective:   Physical Exam Constitutional:      General: He is not in acute distress.    Appearance: He is well-developed.  HENT:     Head: Normocephalic and atraumatic.  Cardiovascular:     Rate and Rhythm: Normal rate and regular rhythm.     Heart sounds: No murmur.  Pulmonary:     Effort: Pulmonary effort is normal. No respiratory distress.     Breath sounds: Normal breath sounds. No wheezing or rales.  Skin:    General: Skin is warm and dry.  Neurological:     Mental Status: He is alert and oriented to person, place, and time.  Psychiatric:        Behavior: Behavior normal.        Thought Content: Thought content normal.           Assessment & Plan:  HTN- bp stable on current meds. Continue same, obtain follow up cmet.  DM2- clinically stable. Advised pt to keep his upcoming appointment with endocrinology.  Hyperlipidemia- obtain follow up lipid panel. Continue statin.

## 2018-05-08 NOTE — Patient Instructions (Signed)
Please complete lab work prior to leaving.   

## 2018-05-09 LAB — COMPREHENSIVE METABOLIC PANEL
ALT: 25 U/L (ref 0–53)
AST: 17 U/L (ref 0–37)
Albumin: 4.2 g/dL (ref 3.5–5.2)
Alkaline Phosphatase: 67 U/L (ref 39–117)
BUN: 20 mg/dL (ref 6–23)
CHLORIDE: 103 meq/L (ref 96–112)
CO2: 30 mEq/L (ref 19–32)
CREATININE: 1.05 mg/dL (ref 0.40–1.50)
Calcium: 9.5 mg/dL (ref 8.4–10.5)
GFR: 90.53 mL/min (ref >60.00)
Glucose, Bld: 153 mg/dL — ABNORMAL HIGH (ref 70–99)
Potassium: 3.7 mEq/L (ref 3.5–5.1)
Sodium: 143 mEq/L (ref 135–145)
Total Bilirubin: 0.5 mg/dL (ref 0.2–1.2)
Total Protein: 6.5 g/dL (ref 6.0–8.3)

## 2018-05-09 LAB — LIPID PANEL
Cholesterol: 159 mg/dL (ref 0–200)
HDL: 43.9 mg/dL (ref >39.00)
LDL Cholesterol: 79 mg/dL (ref 0–99)
NonHDL: 114.89
Total CHOL/HDL Ratio: 4
Triglycerides: 180 mg/dL — ABNORMAL HIGH (ref 0.0–149.0)
VLDL: 36 mg/dL (ref 0.0–40.0)

## 2018-05-10 ENCOUNTER — Encounter: Payer: Self-pay | Admitting: Family

## 2018-05-11 NOTE — Progress Notes (Signed)
Letter mailed out to pt

## 2018-05-15 ENCOUNTER — Other Ambulatory Visit: Payer: Self-pay | Admitting: Family

## 2018-06-08 ENCOUNTER — Other Ambulatory Visit: Payer: Self-pay | Admitting: Internal Medicine

## 2018-06-26 ENCOUNTER — Ambulatory Visit: Payer: BC Managed Care – PPO | Admitting: Family

## 2018-06-26 ENCOUNTER — Ambulatory Visit (HOSPITAL_BASED_OUTPATIENT_CLINIC_OR_DEPARTMENT_OTHER)
Admission: RE | Admit: 2018-06-26 | Discharge: 2018-06-26 | Disposition: A | Discharge status: Home / Self Care | Payer: BC Managed Care – PPO | Source: Ambulatory Visit | Attending: Family | Admitting: Family

## 2018-06-26 ENCOUNTER — Encounter: Payer: Self-pay | Admitting: Family

## 2018-06-26 VITALS — BP 137/74 | HR 58 | Temp 97.6°F | Resp 16 | Ht 64.0 in | Wt 226.0 lb

## 2018-06-26 DIAGNOSIS — M79672 Pain in left foot: Secondary | ICD-10-CM

## 2018-06-26 DIAGNOSIS — M25572 Pain in left ankle and joints of left foot: Secondary | ICD-10-CM | POA: Diagnosis not present

## 2018-06-26 DIAGNOSIS — R600 Localized edema: Secondary | ICD-10-CM | POA: Diagnosis not present

## 2018-06-26 DIAGNOSIS — R011 Cardiac murmur, unspecified: Secondary | ICD-10-CM

## 2018-06-26 DIAGNOSIS — I10 Essential (primary) hypertension: Secondary | ICD-10-CM

## 2018-06-26 NOTE — Patient Instructions (Signed)
Please complete your foot x-ray on the first floor. You should be contacted about scheduling your echocardiogram (heart ultrasound). Call if your swelling worsens or if you develop shortness of breath.

## 2018-06-26 NOTE — Progress Notes (Signed)
Subjective:    Patient ID: Keith Hardin, male    DOB: 1951/02/04, 68 y.o.   MRN: [id number]  HPI  Patient is a 68 yr old male who presents today with chief complaint of bilateral leg swelling.  Wt Readings from Last 3 Encounters:  06/26/18 226 lb (102.5 kg)  05/08/18 229 lb (103.9 kg)  03/16/18 233 lb (105.7 kg)   Reports some left sided foot pain which is being treated by podiatry. Dr. Joya Gaskins.   He repots that podiatry noticed the swelling. Reports that swelling is worst at the end of the day.  He continues hctz.   BP Readings from Last 3 Encounters:  06/26/18 137/74  05/08/18 127/60  03/16/18 112/72   Reports that he twisted his left ankle about 1 month ago. Reports ongoing pain in the left lateral foot/ankle. Reports that podiatry did not x-ray his foot/ankle.    Review of Systems See HPI  Past Medical History:  Diagnosis Date  . Allergy   . Colon polyps   . Diabetes mellitus    type II  . GERD (gastroesophageal reflux disease)   . Glaucoma   . Heart murmur   . Hyperlipidemia   . Hypertension   . Hypogonadism male 05/02/2011  . Orchitis and epididymitis 01/2008   left with left hydrocele     Social History   Socioeconomic History  . Marital status: Married    Spouse name: [name]  . Number of children: 0  . Years of education: Not on file  . Highest education level: Not on file  Occupational History  . Occupation: Merchandiser, retail: UNEMPLOYED  Social Needs  . Financial resource strain: Not on file  . Food insecurity:    Worry: Not on file    Inability: Not on file  . Transportation needs:    Medical: Not on file    Non-medical: Not on file  Tobacco Use  . Smoking status: Never Smoker  . Smokeless tobacco: Never Used  Substance and Sexual Activity  . Alcohol use: No  . Drug use: No  . Sexual activity: Not on file  Lifestyle  . Physical activity:    Days per week: Not on file    Minutes per session: Not on file  . Stress: Not on file    Relationships  . Social connections:    Talks on phone: Not on file    Gets together: Not on file    Attends religious service: Not on file    Active member of club or organization: Not on file    Attends meetings of clubs or organizations: Not on file    Relationship status: Not on file  . Intimate partner violence:    Fear of current or ex partner: Not on file    Emotionally abused: Not on file    Physically abused: Not on file    Forced sexual activity: Not on file  Other Topics Concern  . 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 ACHILLES TENDON REPAIR     right    Family History  Problem Relation Age of Onset  . Seizures Mother   . Hypertension Mother   . Cancer Brother        stomach  . Hypothyroidism Sister   .  Hypertension Brother   . Colon cancer Neg Hx   . Diabetes Neg Hx   . Heart attack Neg Hx   . Hyperlipidemia Neg Hx   . Sudden death Neg Hx     No Known Allergies  Current Outpatient Medications on File Prior to Visit  Medication Sig Dispense Refill  . amLODipine (NORVASC) 5 MG tablet TAKE 1 TABLET(5 MG) BY MOUTH DAILY 90 tablet 1  . aspirin 81 MG tablet Take 81 mg by mouth daily.      Marland Kitchen azelastine (ASTELIN) 0.1 % nasal spray INSTILL 2 SPRAYS INTO BOTH NOSTRILS AT BEDTIME AS NEEDED FOR RHINITIS AS DIRECTED 90 mL 1  . bimatoprost (LUMIGAN) 0.03 % ophthalmic solution     . brimonidine (ALPHAGAN) 0.2 % ophthalmic solution INSTILL 1 DROP INTO AFFECTED EYE(S) BY OPHTHALMIC ROUTE EVERY 8 HOURS  6  . cloNIDine (CATAPRES) 0.3 MG tablet TAKE 1 TABLET(0.3 MG) BY MOUTH TWICE DAILY 180 tablet 1  . dapagliflozin propanediol (FARXIGA) 10 MG TABS tablet Take 10 mg by mouth daily.    Marland Kitchen diltiazem (CARTIA XT) 180 MG 24 hr capsule     . dorzolamide-timolol (COSOPT) 22.3-6.8 MG/ML ophthalmic solution  INSTILL 1 DROP INTO BOTH EYES TWICE A DAY  6  . Ferrous Sulfate (IRON) 325 (65 Fe) MG TABS Take 1 tablet by mouth daily. 30 each 0  . fexofenadine (ALLEGRA) 180 MG tablet Take 180 mg by mouth daily.    . fluticasone (FLONASE) 50 MCG/ACT nasal spray Place 2 sprays into both nostrils daily.    Marland Kitchen glucose blood (ACCU-CHEK AVIVA) test strip Use to test blood sugar three times daily. 50 each 1  . hydrochlorothiazide (HYDRODIURIL) 25 MG tablet TAKE 1 TABLET(25 MG) BY MOUTH DAILY 90 tablet 1  . Insulin Pen Needle (BD PEN NEEDLE NANO U/F) 32G X 4 MM MISC USE TO INJECT INSULIN AS DIRECTED 100 each 4  . ketorolac (ACULAR) 0.5 % ophthalmic solution INSTILL 1 DROP INTO AFFECTED EYE(S) 4 TIMES DAILY  1  . Lancets (ACCU-CHEK MULTICLIX) lancets Use as directed 3 times daily to check blood sugar 102 each 2  . lisinopril (PRINIVIL,ZESTRIL) 40 MG tablet TAKE 1 TABLET(40 MG) BY MOUTH DAILY 90 tablet 1  . Loteprednol Etabonate (LOTEMAX) 0.5 % GEL INSTILL 1 DROP INTO BOTH EYES TWICE A DAY    . Omega-3 Fatty Acids (FISH OIL) 1000 MG CAPS Take 1 capsule by mouth 2 (two) times daily.    Earney Navy Bicarbonate (ZEGERID OTC) 20-1100 MG CAPS capsule TAKE ONE CAPSULE BY MOUTH EVERY DAY BEFORE BREAKFAST 28 capsule 5  . OVER THE COUNTER MEDICATION NATURE'S FORMULA HEALING HERBS TEA for healthy living.  1/2 teaspoon daily in water.    Marland Kitchen OVER THE COUNTER MEDICATION NATURE'S FORMULA.  CLEANSING HERBS for healthy living.  1/2 teaspoon daily in water.    . pioglitazone (ACTOS) 30 MG tablet TAKE 1 TABLET(30 MG) BY MOUTH DAILY 90 tablet 0  . pravastatin (PRAVACHOL) 80 MG tablet TAKE 1 TABLET(80 MG) BY MOUTH DAILY 90 tablet 1  . prednisoLONE acetate (PRED FORTE) 1 % ophthalmic suspension INSTILL 1 DROP INTO BOTH EYES 4 TIMES A DAY  6  . sitaGLIPtin-metformin (JANUMET) 50-1000 MG tablet Take 1 tablet by mouth 2 (two) times daily with a meal. 180 tablet 1  . TRESIBA FLEXTOUCH 100 UNIT/ML SOPN FlexTouch Pen   2   No current  facility-administered medications on file prior to visit.     BP 137/74 (BP Location: Right Arm, Patient  Position: Sitting, Cuff Size: Large)   Pulse (!) 58   Temp 97.6 F (36.4 C) (Oral)   Resp 16   Ht 5\' 4"  (1.626 m)   Wt 226 lb (102.5 kg)   SpO2 98%   BMI 38.79 kg/m       Objective:   Physical Exam Constitutional:      General: He is not in acute distress.    Appearance: He is well-developed.  HENT:     Head: Normocephalic and atraumatic.  Cardiovascular:     Rate and Rhythm: Normal rate and regular rhythm.     Heart sounds: Murmur present. Crescendo  systolic murmur present with a grade of 2/6.  Pulmonary:     Effort: Pulmonary effort is normal. No respiratory distress.     Breath sounds: Normal breath sounds. No wheezing or rales.  Musculoskeletal:     Right lower leg: 2+ Pitting Edema present.     Left lower leg: 2+ Pitting Edema present.     Comments: Mild left ankle swelling.   Skin:    General: Skin is warm and dry.  Neurological:     Mental Status: He is alert and oriented to person, place, and time.  Psychiatric:        Behavior: Behavior normal.        Thought Content: Thought content normal.           Assessment & Plan:  Pedal edema- suspect secondary to amlodipine. Otherwise appears clinically euvolemic.  Murmur- will obtain follow up 2 d echo,  ? AS, looked ok back in 2017.  HTN- bp stable on current meds continue same. He is not bothered by the LE edema but we discussed if this bothers him in the future we can consider changing amlodipine to a different medication.  Left foot/ankle pain- obtain x-ray to rule out fracture.

## 2018-08-18 ENCOUNTER — Other Ambulatory Visit: Payer: Self-pay | Admitting: Family

## 2018-09-22 ENCOUNTER — Other Ambulatory Visit: Payer: Self-pay | Admitting: Family

## 2018-10-05 LAB — HM DIABETES EYE EXAM

## 2018-10-12 ENCOUNTER — Other Ambulatory Visit: Payer: Self-pay | Admitting: Family

## 2018-10-12 DIAGNOSIS — I10 Essential (primary) hypertension: Secondary | ICD-10-CM

## 2018-10-12 NOTE — Telephone Encounter (Signed)
Patient called to check status of refill request. Please advise

## 2018-10-30 ENCOUNTER — Ambulatory Visit (INDEPENDENT_AMBULATORY_CARE_PROVIDER_SITE_OTHER): Payer: BC Managed Care – PPO | Admitting: Family

## 2018-10-30 ENCOUNTER — Other Ambulatory Visit: Payer: Self-pay

## 2018-10-30 DIAGNOSIS — R319 Hematuria, unspecified: Secondary | ICD-10-CM | POA: Diagnosis not present

## 2018-10-30 DIAGNOSIS — E7800 Pure hypercholesterolemia, unspecified: Secondary | ICD-10-CM

## 2018-10-30 DIAGNOSIS — E1165 Type 2 diabetes mellitus with hyperglycemia: Secondary | ICD-10-CM

## 2018-10-30 DIAGNOSIS — D509 Iron deficiency anemia, unspecified: Secondary | ICD-10-CM | POA: Diagnosis not present

## 2018-10-30 DIAGNOSIS — I10 Essential (primary) hypertension: Secondary | ICD-10-CM

## 2018-10-30 DIAGNOSIS — R490 Dysphonia: Secondary | ICD-10-CM

## 2018-10-30 NOTE — Progress Notes (Signed)
Virtual Visit via Video Note  I connected with Keith Hardin on 10/30/18 at 12:20 PM EDT by a video enabled telemedicine application and verified that I am speaking with the correct person using two identifiers.  Location: Patient: home Provider: home   I discussed the limitations of evaluation and management by telemedicine and the availability of in person appointments. The patient expressed understanding and agreed to proceed.  History of Present Illness:  Patient is a 68 yr old male who presents today for follow up.  HTN- pt is maintained on amlodipine 5mg , clonidine 0.3, lisinopril. Denies LE edema, or sob. He states he is not taking diltiazem which is a historical med on his list.    Last bp 117/72 on 6/12 at his endocrinologist's office  Hyperlipidemia- maintained on fish oil, pravachol  Lab Results  Component Value Date   CHOL 159 05/08/2018   HDL 43.90 05/08/2018   LDLCALC 79 05/08/2018   TRIG 180.0 (H) 05/08/2018   CHOLHDL 4 05/08/2018   DM2- last A1C was 8.4 at his endocrinologist last week.  Glimepiride was recently added to his regimen.  Lab Results  Component Value Date   HGBA1C 7.6 (H) 07/30/2015   HGBA1C 6.6 (H) 01/23/2015   HGBA1C 6.7 (A) 10/04/2014   Lab Results  Component Value Date   MICROALBUR <0.7 07/30/2015   [place] 79 05/08/2018   CREATININE 1.05 05/08/2018    Patient reports episode of passing what appeared to be a kidney stone. Reports that he had brief pain and brief hematuria but since that time has has had no further pain or hematuria.   Patient reports some voice hoarseness, for a while.  Reports that he is taking allegra. But still having a lot of sinus drainage.   Past Medical History:  Diagnosis Date  . Allergy   . Colon polyps   . Diabetes mellitus    type II  . GERD (gastroesophageal reflux disease)   . Glaucoma   . Heart murmur   . Hyperlipidemia   . Hypertension   . Hypogonadism male 05/02/2011  . Orchitis and  epididymitis 01/2008   left with left hydrocele     Social History   Socioeconomic History  . Marital status: Married    Spouse name: [name]  . Number of children: 0  . Years of education: Not on file  . Highest education level: Not on file  Occupational History  . Occupation: Merchandiser, retail: UNEMPLOYED  Social Needs  . Financial resource strain: Not on file  . Food insecurity    Worry: Not on file    Inability: Not on file  . Transportation needs    Medical: Not on file    Non-medical: Not on file  Tobacco Use  . Smoking status: Never Smoker  . Smokeless tobacco: Never Used  Substance and Sexual Activity  . Alcohol use: No  . Drug use: No  . Sexual activity: Not on file  Lifestyle  . Physical activity    Days per week: Not on file    Minutes per session: Not on file  . Stress: Not on file  Relationships  . Social Herbalist on phone: Not on file    Gets together: Not on file    Attends religious service: Not on file    Active member of club or organization: Not on file    Attends meetings of clubs or organizations: Not on file    Relationship status: Not  on file  . Intimate partner violence    Fear of current or ex partner: Not on file    Emotionally abused: Not on file    Physically abused: Not on file    Forced sexual activity: Not on file  Other Topics Concern  . 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 ACHILLES TENDON REPAIR     right    Family History  Problem Relation Age of Onset  . Seizures Mother   . Hypertension Mother   . Cancer Brother        stomach  . Hypothyroidism Sister   . Hypertension Brother   . Colon cancer Neg Hx   . Diabetes Neg Hx   . Heart attack Neg Hx   . Hyperlipidemia Neg Hx   . Sudden death Neg Hx     No Known  Allergies  Current Outpatient Medications on File Prior to Visit  Medication Sig Dispense Refill  . amLODipine (NORVASC) 5 MG tablet TAKE 1 TABLET(5 MG) BY MOUTH DAILY 90 tablet 1  . aspirin 81 MG tablet Take 81 mg by mouth daily.      Marland Kitchen azelastine (ASTELIN) 0.1 % nasal spray INSTILL 2 SPRAYS INTO BOTH NOSTRILS AT BEDTIME AS NEEDED FOR RHINITIS AS DIRECTED 90 mL 1  . bimatoprost (LUMIGAN) 0.03 % ophthalmic solution     . brimonidine (ALPHAGAN) 0.2 % ophthalmic solution INSTILL 1 DROP INTO AFFECTED EYE(S) BY OPHTHALMIC ROUTE EVERY 8 HOURS  6  . cloNIDine (CATAPRES) 0.3 MG tablet TAKE 1 TABLET(0.3 MG) BY MOUTH TWICE DAILY 180 tablet 1  . dapagliflozin propanediol (FARXIGA) 10 MG TABS tablet Take 10 mg by mouth daily.    . dorzolamide-timolol (COSOPT) 22.3-6.8 MG/ML ophthalmic solution INSTILL 1 DROP INTO BOTH EYES TWICE A DAY  6  . Ferrous Sulfate (IRON) 325 (65 Fe) MG TABS Take 1 tablet by mouth daily. 30 each 0  . fexofenadine (ALLEGRA) 180 MG tablet Take 180 mg by mouth daily.    . fluticasone (FLONASE) 50 MCG/ACT nasal spray Place 2 sprays into both nostrils daily.    Marland Kitchen glimepiride (AMARYL) 4 MG tablet Take by mouth.    Marland Kitchen glucose blood (ACCU-CHEK AVIVA) test strip Use to test blood sugar three times daily. 50 each 1  . hydrochlorothiazide (HYDRODIURIL) 25 MG tablet TAKE 1 TABLET(25 MG) BY MOUTH DAILY 90 tablet 1  . Insulin Pen Needle (BD PEN NEEDLE NANO U/F) 32G X 4 MM MISC USE TO INJECT INSULIN AS DIRECTED 100 each 4  . ketorolac (ACULAR) 0.5 % ophthalmic solution INSTILL 1 DROP INTO AFFECTED EYE(S) 4 TIMES DAILY  1  . Lancets (ACCU-CHEK MULTICLIX) lancets Use as directed 3 times daily to check blood sugar 102 each 2  . lisinopril (ZESTRIL) 40 MG tablet TAKE 1 TABLET(40 MG) BY MOUTH DAILY 90 tablet 1  . Loteprednol Etabonate (LOTEMAX) 0.5 % GEL INSTILL 1 DROP INTO BOTH EYES TWICE A DAY    . Omega-3 Fatty Acids (FISH OIL) 1000 MG CAPS Take 1 capsule by mouth 2 (two) times daily.    Earney Navy Bicarbonate (ZEGERID OTC) 20-1100 MG CAPS capsule TAKE ONE CAPSULE BY MOUTH EVERY DAY BEFORE BREAKFAST 28 capsule 5  . OVER THE COUNTER MEDICATION NATURE'S FORMULA HEALING HERBS TEA for healthy living.  1/2 teaspoon daily in water.    Marland Kitchen OVER THE COUNTER MEDICATION NATURE'S FORMULA.  CLEANSING HERBS for healthy living.  1/2 teaspoon daily in water.    . pioglitazone (ACTOS) 30 MG tablet TAKE 1 TABLET(30 MG) BY MOUTH DAILY 90 tablet 0  . pravastatin (PRAVACHOL) 80 MG tablet TAKE 1 TABLET(80 MG) BY MOUTH DAILY 90 tablet 1  . prednisoLONE acetate (PRED FORTE) 1 % ophthalmic suspension INSTILL 1 DROP INTO BOTH EYES 4 TIMES A DAY  6  . sitaGLIPtin-metformin (JANUMET) 50-1000 MG tablet Take 1 tablet by mouth 2 (two) times daily with a meal. 180 tablet 1  . TRESIBA FLEXTOUCH 100 UNIT/ML SOPN FlexTouch Pen   2   No current facility-administered medications on file prior to visit.     There were no vitals taken for this visit.     Observations/Objective:   Gen: Awake, alert, no acute distress Resp: Breathing is even and non-labored Psych: calm/pleasant demeanor Neuro: Alert and Oriented x 3, + facial symmetry, speech is clear.   Assessment and Plan:  HTN- bp stable, continue current meds.  Diltiazem removed from list.  DM2- uncontrolled. Defer management to endocrinology.  Hyperlipidemia- stable on current meds.  Hematuria- brief, hx sounds like he passed a kidney stone.  Will have him complete UA with micro and culture to insure resolution of hematuria and no infectionn.  Hoarseness- recommended that he add flonase 2 sprays to each nostril once daily. If hoarseness does not improve in the next few weeks I have asked him to reach out to me and I will place a referral to ent.    Iron deficiency anemia- obtain follow up cbc, serum iron/ferritin  I gave him the number to call to schedule his cardiology apt for bradycardia because he states he has not yet been contacted by  their office.   Follow Up Instructions:    I discussed the assessment and treatment plan with the patient. The patient was provided an opportunity to ask questions and all were answered. The patient agreed with the plan and demonstrated an understanding of the instructions.   The patient was advised to call back or seek an in-person evaluation if the symptoms worsen or if the condition fails to improve as anticipated.  Nance Pear, NP

## 2018-11-02 ENCOUNTER — Other Ambulatory Visit: Payer: Self-pay | Admitting: Family

## 2018-12-13 ENCOUNTER — Other Ambulatory Visit: Payer: Self-pay

## 2018-12-14 ENCOUNTER — Encounter: Payer: Self-pay | Admitting: Internal Medicine

## 2018-12-14 ENCOUNTER — Ambulatory Visit (HOSPITAL_BASED_OUTPATIENT_CLINIC_OR_DEPARTMENT_OTHER)
Admission: RE | Admit: 2018-12-14 | Discharge: 2018-12-14 | Disposition: A | Discharge status: Home / Self Care | Payer: BC Managed Care – PPO | Source: Ambulatory Visit | Attending: Internal Medicine | Admitting: Internal Medicine

## 2018-12-14 ENCOUNTER — Ambulatory Visit: Payer: BC Managed Care – PPO | Admitting: Internal Medicine

## 2018-12-14 VITALS — BP 114/47 | HR 49 | Temp 96.9°F | Resp 16 | Ht 64.0 in | Wt 229.1 lb

## 2018-12-14 DIAGNOSIS — M25551 Pain in right hip: Secondary | ICD-10-CM

## 2018-12-14 DIAGNOSIS — I10 Essential (primary) hypertension: Secondary | ICD-10-CM | POA: Diagnosis not present

## 2018-12-14 DIAGNOSIS — D509 Iron deficiency anemia, unspecified: Secondary | ICD-10-CM | POA: Diagnosis not present

## 2018-12-14 DIAGNOSIS — R319 Hematuria, unspecified: Secondary | ICD-10-CM | POA: Diagnosis not present

## 2018-12-14 LAB — URINALYSIS, ROUTINE W REFLEX MICROSCOPIC
Bilirubin Urine: NEGATIVE
Hgb urine dipstick: NEGATIVE
Ketones, ur: NEGATIVE
Nitrite: NEGATIVE
RBC / HPF: NONE SEEN (ref 0–?)
Specific Gravity, Urine: 1.015 (ref 1.000–1.030)
Total Protein, Urine: NEGATIVE
Urine Glucose: >=1000 — AB
Urobilinogen, UA: 0.2 (ref 0.0–1.0)
pH: 6 (ref 5.0–8.0)

## 2018-12-14 LAB — IRON: Iron: 52 ug/dL (ref 42–165)

## 2018-12-14 LAB — CBC WITH DIFFERENTIAL/PLATELET
Basophils Absolute: 0 10*3/uL (ref 0.0–0.1)
Basophils Relative: 0.6 % (ref 0.0–3.0)
Eosinophils Absolute: 0.2 10*3/uL (ref 0.0–0.7)
Eosinophils Relative: 3.7 % (ref 0.0–5.0)
HCT: 38.3 % — ABNORMAL LOW (ref 39.0–52.0)
Hemoglobin: 12.5 g/dL — ABNORMAL LOW (ref 13.0–17.0)
Lymphocytes Relative: 27.9 % (ref 12.0–46.0)
Lymphs Abs: 1.3 10*3/uL (ref 0.7–4.0)
MCHC: 32.7 g/dL (ref 30.0–36.0)
MCV: 83.9 fl (ref 78.0–100.0)
Monocytes Absolute: 0.5 10*3/uL (ref 0.1–1.0)
Monocytes Relative: 11 % (ref 3.0–12.0)
Neutro Abs: 2.7 10*3/uL (ref 1.4–7.7)
Neutrophils Relative %: 56.8 % (ref 43.0–77.0)
Platelets: 217 10*3/uL (ref 150.0–400.0)
RBC: 4.57 Mil/uL (ref 4.22–5.81)
RDW: 16.3 % — ABNORMAL HIGH (ref 11.5–15.5)
WBC: 4.8 10*3/uL (ref 4.0–10.5)

## 2018-12-14 LAB — BASIC METABOLIC PANEL
BUN: 17 mg/dL (ref 6–23)
CO2: 28 mEq/L (ref 19–32)
Calcium: 8.8 mg/dL (ref 8.4–10.5)
Chloride: 107 mEq/L (ref 96–112)
Creatinine, Ser: 0.96 mg/dL (ref 0.40–1.50)
GFR: 94.28 mL/min (ref >60.00)
Glucose, Bld: 89 mg/dL (ref 70–99)
Potassium: 3.5 mEq/L (ref 3.5–5.1)
Sodium: 145 mEq/L (ref 135–145)

## 2018-12-14 LAB — FERRITIN: Ferritin: 41.6 ng/mL (ref 22.0–322.0)

## 2018-12-14 NOTE — Patient Instructions (Signed)
GO TO THE LAB : Get the blood work    STOP BY THE FIRST FLOOR:  get the XR   Tylenol  500 mg OTC 2 tabs a day every 8 hours as needed for pain  Call if pain severe  We are referring you to Ortho

## 2018-12-14 NOTE — Progress Notes (Signed)
Subjective:    Patient ID: Keith Hardin, male    DOB: Aug 30, 1950, 68 y.o.   MRN: [id number]  DOS:  12/14/2018 Type of visit - description: Acute 2 days history of pain at the right anterior hip. Pain increased by walking with certain movements. Lump?  The patient is not sure.   Review of Systems  Denies any fall, injury rash. No testicular pain No nausea or vomiting. Was recently seen with hematuria, symptoms self resolved.  Past Medical History:  Diagnosis Date  . Allergy   . Colon polyps   . Diabetes mellitus    type II  . GERD (gastroesophageal reflux disease)   . Glaucoma   . Heart murmur   . Hyperlipidemia   . Hypertension   . Hypogonadism male 05/02/2011  . Orchitis and epididymitis 01/2008   left with left hydrocele    Past Surgical History:  Procedure Laterality Date  . ACHILLES TENDON REPAIR     right    Social History   Socioeconomic History  . Marital status: Married    Spouse name: [name]  . Number of children: 0  . Years of education: Not on file  . Highest education level: Not on file  Occupational History  . Occupation: Merchandiser, retail: UNEMPLOYED  Social Needs  . Financial resource strain: Not on file  . Food insecurity    Worry: Not on file    Inability: Not on file  . Transportation needs    Medical: Not on file    Non-medical: Not on file  Tobacco Use  . Smoking status: Never Smoker  . Smokeless tobacco: Never Used  Substance and Sexual Activity  . Alcohol use: No  . Drug use: No  . Sexual activity: Not on file  Lifestyle  . Physical activity    Days per week: Not on file    Minutes per session: Not on file  . Stress: Not on file  Relationships  . Social Herbalist on phone: Not on file    Gets together: Not on file    Attends religious service: Not on file    Active member of club or organization: Not on file    Attends meetings of clubs or organizations: Not on file    Relationship status: Not on  file  . Intimate partner violence    Fear of current or ex partner: Not on file    Emotionally abused: Not on file    Physically abused: Not on file    Forced sexual activity: Not on file  Other Topics Concern  . 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 If you have any questions, ask your nurse or doctor.        accu-chek multiclix lancets Use as directed 3 times daily to check blood sugar   amLODipine 5 MG tablet Commonly known as: NORVASC TAKE 1 TABLET BY MOUTH EVERY DAY   aspirin 81 MG tablet Take 81 mg by mouth daily.   azelastine 0.1 % nasal spray Commonly known as: ASTELIN INSTILL 2 SPRAYS INTO BOTH NOSTRILS AT BEDTIME AS NEEDED FOR RHINITIS AS DIRECTED  bimatoprost 0.03 % ophthalmic solution Commonly known as: LUMIGAN   brimonidine 0.2 % ophthalmic solution Commonly known as: ALPHAGAN INSTILL 1 DROP INTO AFFECTED EYE(S) BY OPHTHALMIC ROUTE EVERY 8 HOURS   cloNIDine 0.3 MG tablet Commonly known as: CATAPRES TAKE 1 TABLET(0.3 MG) BY MOUTH TWICE DAILY   dapagliflozin propanediol 10 MG Tabs tablet Commonly known as: FARXIGA Take 10 mg by mouth daily.   dorzolamide-timolol 22.3-6.8 MG/ML ophthalmic solution Commonly known as: COSOPT INSTILL 1 DROP INTO BOTH EYES TWICE A DAY   fexofenadine 180 MG tablet Commonly known as: ALLEGRA Take 180 mg by mouth daily.   Fish Oil 1000 MG Caps Take 1 capsule by mouth 2 (two) times daily.   fluticasone 50 MCG/ACT nasal spray Commonly known as: FLONASE Place 2 sprays into both nostrils daily.   glimepiride 4 MG tablet Commonly known as: AMARYL Take by mouth.   glucose blood test strip Commonly known as: Accu-Chek Aviva Use to test blood sugar three times daily.    hydrochlorothiazide 25 MG tablet Commonly known as: HYDRODIURIL TAKE 1 TABLET(25 MG) BY MOUTH DAILY   Insulin Pen Needle 32G X 4 MM Misc Commonly known as: BD Pen Needle Nano U/F USE TO INJECT INSULIN AS DIRECTED   Iron 325 (65 Fe) MG Tabs Take 1 tablet by mouth daily.   ketorolac 0.5 % ophthalmic solution Commonly known as: ACULAR INSTILL 1 DROP INTO AFFECTED EYE(S) 4 TIMES DAILY   lisinopril 40 MG tablet Commonly known as: ZESTRIL TAKE 1 TABLET(40 MG) BY MOUTH DAILY   Lotemax 0.5 % Gel Generic drug: Loteprednol Etabonate INSTILL 1 DROP INTO BOTH EYES TWICE A DAY   Omeprazole-Sodium Bicarbonate 20-1100 MG Caps capsule Commonly known as: Zegerid OTC TAKE ONE CAPSULE BY MOUTH EVERY DAY BEFORE BREAKFAST   OVER THE COUNTER MEDICATION NATURE'S FORMULA HEALING HERBS TEA for healthy living.  1/2 teaspoon daily in water.   OVER THE COUNTER MEDICATION NATURE'S FORMULA.  CLEANSING HERBS for healthy living.  1/2 teaspoon daily in water.   pioglitazone 30 MG tablet Commonly known as: ACTOS TAKE 1 TABLET(30 MG) BY MOUTH DAILY   pravastatin 80 MG tablet Commonly known as: PRAVACHOL TAKE 1 TABLET(80 MG) BY MOUTH DAILY   prednisoLONE acetate 1 % ophthalmic suspension Commonly known as: PRED FORTE INSTILL 1 DROP INTO BOTH EYES 4 TIMES A DAY   sitaGLIPtin-metformin 50-1000 MG tablet Commonly known as: JANUMET Take 1 tablet by mouth 2 (two) times daily with a meal.   Tyler Aas FlexTouch 100 UNIT/ML Sopn FlexTouch Pen Generic drug: insulin degludec           Objective:   Physical Exam BP (!) 114/47 (BP Location: Left Arm, Patient Position: Sitting, Cuff Size: Normal)   Pulse (!) 49   Temp (!) 96.9 F (36.1 C) (Temporal)   Resp 16   Ht 5\' 4"  (1.626 m)   Wt 229 lb 2 oz (103.9 kg)   SpO2 100%   BMI 39.33 kg/m  General:   Well developed, NAD, BMI noted.  HEENT:  Normocephalic . Face symmetric, atraumatic Lungs:  CTA B Normal respiratory effort, no intercostal  retractions, no accessory muscle use. Heart: RRR,  no murmur.  no pretibial edema bilaterally  Abdomen:  Not distended, soft, non-tender. No rebound or rigidity. GU: Scrotal contents normal, groins with no mass or hernia.  No lymphadenopathies. Skin: Not pale. Not jaundice MSK: Walks with mild limp due to pain Left hip rotation normal Right hip rotation: Full, mostly with abduction. Neurologic:  alert &  oriented X3.  Speech normal, gait appropriate for age and unassisted Psych--  Cognition and judgment appear intact.  Cooperative with normal attention span and concentration.  Behavior appropriate. No anxious or depressed appearing.     Assessment      68 year old gentleman.  PMH includes diabetes, HTN, high cholesterol, morbid obesity, mild aortic stenosis.  Presents with.   Hip pain: Right hip pain with no evidence of hernia or GU issue. Would recommend a x-ray, Tylenol, Ortho referral Was seen by PCP , did not go through with labs. -HTN: Seems controlled check a BMP -History of anemia: Check a CBC, iron, ferritin -Recent hematuria: Resolved per patient, check a UA urine culture Has not seen Endo, will let PCP know via messsage

## 2018-12-14 NOTE — Progress Notes (Signed)
Pre visit review using our clinic review tool, if applicable. No additional management support is needed unless otherwise documented below in the visit note. 

## 2018-12-15 ENCOUNTER — Ambulatory Visit: Payer: BC Managed Care – PPO | Admitting: Family

## 2018-12-15 ENCOUNTER — Telehealth: Payer: Self-pay

## 2018-12-15 NOTE — Telephone Encounter (Signed)
Copied from [place] [id number]. Topic: General - Other >> Dec 15, 2018  1:56 PM Celene Kras A wrote: Reason for CRM: Pt called and is requesting his results for his xray. Please advise.

## 2018-12-15 NOTE — Telephone Encounter (Signed)
Advise patient, minimal evidence of arthritis, plan is the same (Tylenol, Ortho referral)

## 2018-12-15 NOTE — Telephone Encounter (Signed)
Spoke w/ Pt-results given.

## 2018-12-16 LAB — URINE CULTURE
MICRO NUMBER:: 793453
SPECIMEN QUALITY:: ADEQUATE

## 2018-12-18 ENCOUNTER — Telehealth: Payer: Self-pay | Admitting: Family

## 2018-12-18 NOTE — Telephone Encounter (Signed)
Please advise pt that iron levels look good.  He is still mildly anemic. I would like him to complete IFOB dx anemia please.    No microscopic urine in his urinalysis which is good.

## 2018-12-18 NOTE — Telephone Encounter (Signed)
Results given to patient, advised to come and pick up IFOB kit

## 2018-12-27 ENCOUNTER — Other Ambulatory Visit (INDEPENDENT_AMBULATORY_CARE_PROVIDER_SITE_OTHER): Payer: BC Managed Care – PPO

## 2018-12-27 ENCOUNTER — Other Ambulatory Visit: Payer: Self-pay | Admitting: Emergency Medicine

## 2018-12-27 DIAGNOSIS — D509 Iron deficiency anemia, unspecified: Secondary | ICD-10-CM | POA: Diagnosis not present

## 2018-12-27 LAB — FECAL OCCULT BLOOD, IMMUNOCHEMICAL: Fecal Occult Bld: NEGATIVE

## 2018-12-28 ENCOUNTER — Encounter: Payer: Self-pay | Admitting: Family

## 2019-01-19 ENCOUNTER — Other Ambulatory Visit: Payer: Self-pay | Admitting: Family

## 2019-02-21 ENCOUNTER — Other Ambulatory Visit: Payer: Self-pay | Admitting: Family

## 2019-02-21 ENCOUNTER — Other Ambulatory Visit: Payer: Self-pay

## 2019-02-21 MED ORDER — PRAVASTATIN SODIUM 80 MG PO TABS: ORAL_TABLET | ORAL | 1 refills | Status: DC

## 2019-02-21 NOTE — Telephone Encounter (Signed)
Medication Refill - Medication: pravastatin (PRAVACHOL) 80 MG tablet [id number]     Preferred Pharmacy (with phone number or street name):  [facility] [id number] - [place],  - [address]  [address] [place] [address]  Phone: [phone] Fax: [phone]     Agent: Please be advised that RX refills may take up to 3 business days. We ask that you follow-up with your pharmacy.

## 2019-02-23 ENCOUNTER — Encounter: Payer: Self-pay | Admitting: Internal Medicine

## 2019-02-23 ENCOUNTER — Other Ambulatory Visit: Payer: Self-pay

## 2019-02-23 ENCOUNTER — Ambulatory Visit: Payer: BC Managed Care – PPO | Admitting: Internal Medicine

## 2019-02-23 VITALS — BP 134/61 | HR 66 | Temp 97.4°F | Resp 16 | Ht 64.0 in | Wt 233.2 lb

## 2019-02-23 DIAGNOSIS — B3749 Other urogenital candidiasis: Secondary | ICD-10-CM

## 2019-02-23 MED ORDER — MICONAZOLE NITRATE 2 % EX CREA: 1.0000 "application " | TOPICAL_CREAM | Freq: Two times a day (BID) | CUTANEOUS | 0 refills | Status: DC

## 2019-02-23 MED ORDER — FLUCONAZOLE 150 MG PO TABS: 150.0000 mg | ORAL_TABLET | Freq: Every day | ORAL | 0 refills | Status: DC

## 2019-02-23 NOTE — Progress Notes (Signed)
Pre visit review using our clinic review tool, if applicable. No additional management support is needed unless otherwise documented below in the visit note. 

## 2019-02-23 NOTE — Progress Notes (Signed)
Subjective:    Patient ID: Keith Hardin, male    DOB: January 26, 1951, 68 y.o.   MRN: [id number]  DOS:  02/23/2019 Type of visit - description: Acute visit Reports skin irritation of the penis for the last 3 weeks, on and off. Has not attempted to put any creams. This is the first time this happened. He has poorly controlled diabetes.    Review of Systems Denies fever chills No penile discharge per se No scrotal swelling or testicular pain.  Past Medical History:  Diagnosis Date  . Allergy   . Colon polyps   . Diabetes mellitus    type II  . GERD (gastroesophageal reflux disease)   . Glaucoma   . Heart murmur   . Hyperlipidemia   . Hypertension   . Hypogonadism male 05/02/2011  . Orchitis and epididymitis 01/2008   left with left hydrocele    Past Surgical History:  Procedure Laterality Date  . ACHILLES TENDON REPAIR     right    Social History   Socioeconomic History  . Marital status: Married    Spouse name: [name]  . Number of children: 0  . Years of education: Not on file  . Highest education level: Not on file  Occupational History  . Occupation: Merchandiser, retail: UNEMPLOYED  Social Needs  . Financial resource strain: Not on file  . Food insecurity    Worry: Not on file    Inability: Not on file  . Transportation needs    Medical: Not on file    Non-medical: Not on file  Tobacco Use  . Smoking status: Never Smoker  . Smokeless tobacco: Never Used  Substance and Sexual Activity  . Alcohol use: No  . Drug use: No  . Sexual activity: Not on file  Lifestyle  . Physical activity    Days per week: Not on file    Minutes per session: Not on file  . Stress: Not on file  Relationships  . Social Herbalist on phone: Not on file    Gets together: Not on file    Attends religious service: Not on file    Active member of club or organization: Not on file    Attends meetings of clubs or organizations: Not on file    Relationship  status: Not on file  . Intimate partner violence    Fear of current or ex partner: Not on file    Emotionally abused: Not on file    Physically abused: Not on file    Forced sexual activity: Not on file  Other Topics Concern  . 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 If you have any questions, ask your nurse or doctor.        STOP taking these medications   sitaGLIPtin-metformin 50-1000 MG tablet Commonly known as: JANUMET Stopped by: Kathlene November, MD     TAKE these medications   accu-chek multiclix lancets Use as directed 3 times daily to check blood sugar   amLODipine 5 MG tablet Commonly known as: NORVASC TAKE 1 TABLET BY MOUTH EVERY DAY   aspirin 81 MG tablet  Take 81 mg by mouth daily.   azelastine 0.1 % nasal spray Commonly known as: ASTELIN INSTILL 2 SPRAYS INTO BOTH NOSTRILS AT BEDTIME AS NEEDED FOR RHINITIS AS DIRECTED   BD Pen Needle Nano U/F 32G X 4 MM Misc Generic drug: Insulin Pen Needle USE AS DIRECTED TO INJECT INSULIN   bimatoprost 0.03 % ophthalmic solution Commonly known as: LUMIGAN   brimonidine 0.2 % ophthalmic solution Commonly known as: ALPHAGAN INSTILL 1 DROP INTO AFFECTED EYE(S) BY OPHTHALMIC ROUTE EVERY 8 HOURS   cloNIDine 0.3 MG tablet Commonly known as: CATAPRES TAKE 1 TABLET(0.3 MG) BY MOUTH TWICE DAILY   dapagliflozin propanediol 10 MG Tabs tablet Commonly known as: FARXIGA Take 10 mg by mouth daily.   dorzolamide-timolol 22.3-6.8 MG/ML ophthalmic solution Commonly known as: COSOPT INSTILL 1 DROP INTO BOTH EYES TWICE A DAY   fexofenadine 180 MG tablet Commonly known as: ALLEGRA Take 180 mg by mouth daily.   Fish Oil 1000 MG Caps Take 1 capsule by mouth 2 (two) times daily.    fluconazole 150 MG tablet Commonly known as: DIFLUCAN Take 1 tablet (150 mg total) by mouth daily. Started by: Kathlene November, MD   fluticasone 50 MCG/ACT nasal spray Commonly known as: FLONASE Place 2 sprays into both nostrils daily.   glimepiride 4 MG tablet Commonly known as: AMARYL Take by mouth.   glucose blood test strip Commonly known as: Accu-Chek Aviva Use to test blood sugar three times daily.   hydrochlorothiazide 25 MG tablet Commonly known as: HYDRODIURIL TAKE 1 TABLET(25 MG) BY MOUTH DAILY   Iron 325 (65 Fe) MG Tabs Take 1 tablet by mouth daily.   ketorolac 0.5 % ophthalmic solution Commonly known as: ACULAR INSTILL 1 DROP INTO AFFECTED EYE(S) 4 TIMES DAILY   lisinopril 40 MG tablet Commonly known as: ZESTRIL TAKE 1 TABLET(40 MG) BY MOUTH DAILY   Lotemax 0.5 % Gel Generic drug: Loteprednol Etabonate INSTILL 1 DROP INTO BOTH EYES TWICE A DAY   metFORMIN 1000 MG tablet Commonly known as: GLUCOPHAGE Take 1,000 mg by mouth 2 (two) times daily with a meal.   miconazole 2 % cream Commonly known as: MICOTIN Apply 1 application topically 2 (two) times daily. Started by: Kathlene November, MD   Omeprazole-Sodium Bicarbonate 20-1100 MG Caps capsule Commonly known as: Zegerid OTC TAKE ONE CAPSULE BY MOUTH EVERY DAY BEFORE BREAKFAST   OVER THE COUNTER MEDICATION NATURE'S FORMULA HEALING HERBS TEA for healthy living.  1/2 teaspoon daily in water.   OVER THE COUNTER MEDICATION NATURE'S FORMULA.  CLEANSING HERBS for healthy living.  1/2 teaspoon daily in water.   pioglitazone 30 MG tablet Commonly known as: ACTOS TAKE 1 TABLET(30 MG) BY MOUTH DAILY   pravastatin 80 MG tablet Commonly known as: PRAVACHOL TAKE 1 TABLET(80 MG) BY MOUTH DAILY   prednisoLONE acetate 1 % ophthalmic suspension Commonly known as: PRED FORTE INSTILL 1 DROP INTO BOTH EYES 4 TIMES A DAY   Rybelsus 3 MG Tabs Generic drug: Semaglutide Take 3 mg by mouth daily. 3mg  daily for 30 days, then  increase to 7mg    Tresiba FlexTouch 100 UNIT/ML Sopn FlexTouch Pen Generic drug: insulin degludec           Objective:   Physical Exam BP 134/61 (BP Location: Left Arm, Patient Position: Sitting, Cuff Size: Normal)   Pulse 66   Temp (!) 97.4 F (36.3 C) (Temporal)   Resp 16   Ht 5\' 4"  (1.626 m)   Wt 233 lb 4 oz (105.8 kg)  SpO2 99%   BMI 40.04 kg/m  General:   Well developed, NAD, BMI noted. HEENT:  Normocephalic . Face symmetric, atraumatic GU: Scrotal contents normal Penis: Uncircumcised, foreskin is swollen, easily retracted, slightly erythematous, glands is also erythematous.  No blisters. Neurologic:  alert & oriented X3.  Speech normal, gait appropriate for age and unassisted Psych--  Cognition and judgment appear intact.  Cooperative with normal attention span and concentration.  Behavior appropriate. No anxious or depressed appearing.      Assessment     68 year old gentleman.  PMH includes diabetes, HTN, high cholesterol, morbid obesity, mild aortic stenosis.  Presents with.  Balanoposthitis: The patient has poorly controlled diabetes, has been on farxiga for a while, this is the first time he has an infection like this. I explained the patient what the infection is, also the importance to treat this to prevent from getting worse and possibly having a bacterial infection. He is aware that diabetes makes him prone to this type of problems particularly if he takes farxiga. Encouraged to discuss that with his diabetes doctor. Plan: Diflucan x1, miconazole topical, keep the area clean and dry, call if not better, see AVS.

## 2019-02-23 NOTE — Patient Instructions (Signed)
Take the medication call fluconazole 1 time  Use the cream to 3 times a day  Keep the area clean and dry  Call if you are not gradually improving  Call if the symptoms come back, let your diabetic doctor know about them.

## 2019-03-23 ENCOUNTER — Telehealth: Payer: Self-pay | Admitting: Family

## 2019-03-23 NOTE — Telephone Encounter (Signed)
rx refill cloNIDine (CATAPRES) 0.3 MG tablet  PHARMACY [facility] [id number] - [place], [place] - [address] AT [address] [phone] (Phone) [phone] (Fax

## 2019-03-26 ENCOUNTER — Other Ambulatory Visit: Payer: Self-pay

## 2019-03-26 MED ORDER — CLONIDINE HCL 0.3 MG PO TABS: ORAL_TABLET | ORAL | 1 refills | Status: DC

## 2019-03-26 NOTE — Telephone Encounter (Signed)
rx was sent in for patient

## 2019-04-07 ENCOUNTER — Other Ambulatory Visit: Payer: Self-pay | Admitting: Family

## 2019-04-07 DIAGNOSIS — I10 Essential (primary) hypertension: Secondary | ICD-10-CM

## 2019-04-11 ENCOUNTER — Other Ambulatory Visit: Payer: Self-pay | Admitting: Family

## 2019-04-11 MED ORDER — LISINOPRIL 40 MG PO TABS: ORAL_TABLET | ORAL | 1 refills | Status: DC

## 2019-04-11 NOTE — Telephone Encounter (Signed)
Medication Refill - Medication: lisinopril (ZESTRIL) 40 MG tablet   Pt stated he requested refill on 04/09/19 with pharmacy but they did not send request and now pt is out of medication.  Has the patient contacted their pharmacy? Yes.   (Agent: If no, request that the patient contact the pharmacy for the refill.) (Agent: If yes, when and what did the pharmacy advise?)  Preferred Pharmacy (with phone number or street name):  [facility] [id number] - [place], [place] - [address] AT [place] Phone:  [phone]  Fax:  [phone]       Agent: Please be advised that RX refills may take up to 3 business days. We ask that you follow-up with your pharmacy.

## 2019-05-05 ENCOUNTER — Other Ambulatory Visit: Payer: Self-pay | Admitting: Family

## 2019-05-07 NOTE — Telephone Encounter (Signed)
90 day supply amlodipine sent to pharmacy. Spoke to pt and scheduled 20m f/u of HTN, hyperlipidemia for 8:40am on 05/11/19.

## 2019-05-11 ENCOUNTER — Ambulatory Visit (INDEPENDENT_AMBULATORY_CARE_PROVIDER_SITE_OTHER): Payer: BC Managed Care – PPO | Admitting: Family

## 2019-05-11 ENCOUNTER — Other Ambulatory Visit: Payer: Self-pay

## 2019-05-11 ENCOUNTER — Encounter: Payer: Self-pay | Admitting: Family

## 2019-05-11 DIAGNOSIS — I10 Essential (primary) hypertension: Secondary | ICD-10-CM

## 2019-05-11 DIAGNOSIS — E7800 Pure hypercholesterolemia, unspecified: Secondary | ICD-10-CM | POA: Diagnosis not present

## 2019-05-11 DIAGNOSIS — E119 Type 2 diabetes mellitus without complications: Secondary | ICD-10-CM | POA: Diagnosis not present

## 2019-05-11 DIAGNOSIS — R609 Edema, unspecified: Secondary | ICD-10-CM | POA: Diagnosis not present

## 2019-05-11 MED ORDER — FUROSEMIDE 20 MG PO TABS: ORAL_TABLET | ORAL | 0 refills | Status: DC

## 2019-05-11 NOTE — Progress Notes (Signed)
Subjective:    Patient ID: Keith Hardin, male    DOB: 08-Jun-1950, 69 y.o.   MRN: [id number]  HPI Virtual Visit via Video Note  I connected with Keith Hardin on 05/11/19 at  8:40 AM EST by a video enabled telemedicine application and verified that I am speaking with the correct person using two identifiers.  Location: Patient: home Provider: work   I discussed the limitations of evaluation and management by telemedicine and the availability of in person appointments. The patient expressed understanding and agreed to proceed.  Patient is a 69 yr old male who presents today for follow up.  Hyperlipidemia- maintained on pravachol. Lab Results  Component Value Date   CHOL 159 05/08/2018   HDL 43.90 05/08/2018   LDLCALC 79 05/08/2018   TRIG 180.0 (H) 05/08/2018   CHOLHDL 4 05/08/2018   DM2- following with Peri Jefferson PA-C (endocrinology) Reports glucose 87 today. Last A1C in October 2020 was 7.2.    Lab Results  Component Value Date   HGBA1C 7.6 (H) 07/30/2015   HGBA1C 6.6 (H) 01/23/2015   HGBA1C 6.7 (A) 10/04/2014   Lab Results  Component Value Date   MICROALBUR <0.7 07/30/2015   LDLCALC 79 05/08/2018   CREATININE 0.96 12/14/2018   HTN- maintained on HCTZ, clonidine, lisinopril. (bp at endo was 127/74 on 02/09/19) BP Readings from Last 3 Encounters:  02/23/19 134/61  12/14/18 (!) 114/47  06/26/18 137/74   C/o pitting edema- Denies SOB.   Review of Systems    see HPI  Past Medical History:  Diagnosis Date  . Allergy   . Colon polyps   . Diabetes mellitus    type II  . GERD (gastroesophageal reflux disease)   . Glaucoma   . Heart murmur   . Hyperlipidemia   . Hypertension   . Hypogonadism male 05/02/2011  . Orchitis and epididymitis 01/2008   left with left hydrocele     Social History   Socioeconomic History  . Marital status: Married    Spouse name: Keith Hardin  . Number of children: 0  . Years of education: Not on file  . Highest education  level: Not on file  Occupational History  . Occupation: Merchandiser, retail: UNEMPLOYED  Tobacco Use  . Smoking status: Never Smoker  . Smokeless tobacco: Never Used  Substance and Sexual Activity  . Alcohol use: No  . Drug use: No  . Sexual activity: Not on file  Other Topics Concern  . 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 Difficulty of Paying Living Expenses: Not on file  Food Insecurity:   . Worried About Charity fundraiser in the Last Year: Not on file  . Ran Out of Food in the Last Year: Not on file  Transportation Needs:   . Lack of Transportation (Medical): Not on file  . Lack of Transportation (Non-Medical): Not on file  Physical Activity:   . Days of Exercise per Week: Not on file  . Minutes of Exercise per Session: Not on file  Stress:   . Feeling of Stress : Not on file  Social Connections:   . Frequency of Communication with Friends and Family: Not on file  . Frequency of Social Gatherings with Friends and Family: Not  on file  . Attends Religious Services: Not on file  . Active Member of Clubs or Organizations: Not on file  . Attends Archivist Meetings: Not on file  . Marital Status: Not on file  Intimate Partner Violence:   . Fear of Current or Ex-Partner: Not on file  . Emotionally Abused: Not on file  . Physically Abused: Not on file  . Sexually Abused: Not on file    Past Surgical History:  Procedure Laterality Date  . ACHILLES TENDON REPAIR     right    Family History  Problem Relation Age of Onset  . Seizures Mother   . Hypertension Mother   . Cancer Brother        stomach  . Hypothyroidism Sister   . Hypertension Brother   . Colon cancer Neg Hx   . Diabetes Neg Hx   . Heart attack Neg Hx   . Hyperlipidemia Neg Hx   . Sudden death Neg Hx       No Known Allergies  Current Outpatient Medications on File Prior to Visit  Medication Sig Dispense Refill  . amLODipine (NORVASC) 5 MG tablet TAKE 1 TABLET BY MOUTH EVERY DAY 90 tablet 0  . aspirin 81 MG tablet Take 81 mg by mouth daily.      Marland Kitchen azelastine (ASTELIN) 0.1 % nasal spray INSTILL 2 SPRAYS INTO BOTH NOSTRILS AT BEDTIME AS NEEDED FOR RHINITIS AS DIRECTED 90 mL 1  . bimatoprost (LUMIGAN) 0.03 % ophthalmic solution     . brimonidine (ALPHAGAN) 0.2 % ophthalmic solution INSTILL 1 DROP INTO AFFECTED EYE(S) BY OPHTHALMIC ROUTE EVERY 8 HOURS  6  . cloNIDine (CATAPRES) 0.3 MG tablet TAKE 1 TABLET(0.3 MG) BY MOUTH TWICE DAILY 180 tablet 1  . dapagliflozin propanediol (FARXIGA) 10 MG TABS tablet Take 10 mg by mouth daily.    . dorzolamide-timolol (COSOPT) 22.3-6.8 MG/ML ophthalmic solution INSTILL 1 DROP INTO BOTH EYES TWICE A DAY  6  . Ferrous Sulfate (IRON) 325 (65 Fe) MG TABS Take 1 tablet by mouth daily. 30 each 0  . fexofenadine (ALLEGRA) 180 MG tablet Take 180 mg by mouth daily.    . fluconazole (DIFLUCAN) 150 MG tablet Take 1 tablet (150 mg total) by mouth daily. 1 tablet 0  . fluticasone (FLONASE) 50 MCG/ACT nasal spray Place 2 sprays into both nostrils daily.    Marland Kitchen glimepiride (AMARYL) 4 MG tablet Take by mouth.    Marland Kitchen glucose blood (ACCU-CHEK AVIVA) test strip Use to test blood sugar three times daily. 50 each 1  . hydrochlorothiazide (HYDRODIURIL) 25 MG tablet TAKE 1 TABLET(25 MG) BY MOUTH DAILY 90 tablet 0  . Insulin Pen Needle (BD PEN NEEDLE NANO U/F) 32G X 4 MM MISC USE AS DIRECTED TO INJECT INSULIN 100 each 4  . ketorolac (ACULAR) 0.5 % ophthalmic solution INSTILL 1 DROP INTO AFFECTED EYE(S) 4 TIMES DAILY  1  . Lancets (ACCU-CHEK MULTICLIX) lancets Use as directed 3 times daily to check blood sugar 102 each 2  . lisinopril (ZESTRIL) 40 MG tablet TAKE 1 TABLET(40 MG) BY MOUTH DAILY 90 tablet 1  . Loteprednol Etabonate (LOTEMAX) 0.5 % GEL INSTILL 1 DROP INTO BOTH EYES TWICE A  DAY    . metFORMIN (GLUCOPHAGE) 1000 MG tablet Take 1,000 mg by mouth 2 (two) times daily with a meal.    . miconazole (MICOTIN) 2 % cream Apply 1 application topically 2 (two) times daily. 28.35 g 0  . Omega-3 Fatty Acids (FISH OIL)  1000 MG CAPS Take 1 capsule by mouth 2 (two) times daily.    Earney Navy Bicarbonate (ZEGERID OTC) 20-1100 MG CAPS capsule TAKE ONE CAPSULE BY MOUTH EVERY DAY BEFORE BREAKFAST 28 capsule 5  . OVER THE COUNTER MEDICATION NATURE'S FORMULA HEALING HERBS TEA for healthy living.  1/2 teaspoon daily in water.    Marland Kitchen OVER THE COUNTER MEDICATION NATURE'S FORMULA.  CLEANSING HERBS for healthy living.  1/2 teaspoon daily in water.    . pioglitazone (ACTOS) 30 MG tablet TAKE 1 TABLET(30 MG) BY MOUTH DAILY 90 tablet 0  . pravastatin (PRAVACHOL) 80 MG tablet TAKE 1 TABLET(80 MG) BY MOUTH DAILY 90 tablet 1  . prednisoLONE acetate (PRED FORTE) 1 % ophthalmic suspension INSTILL 1 DROP INTO BOTH EYES 4 TIMES A DAY  6  . Semaglutide (RYBELSUS) 3 MG TABS Take 3 mg by mouth daily. 3mg  daily for 30 days, then increase to 7mg     . TRESIBA FLEXTOUCH 100 UNIT/ML SOPN FlexTouch Pen   2   No current facility-administered medications on file prior to visit.    There were no vitals taken for this visit.   Objective:   Physical Exam  Gen: Awake, alert, no acute distress Resp: Breathing is even and non-labored Psych: calm/pleasant demeanor Neuro: Alert and Oriented x 3, + facial symmetry, speech is clear. Ext: 3+ LE edema       Assessment & Plan:  Edema- will add lasix as follows:  One tab once daily for 3 days then one tab once daily as needed.  HTN- BP appears stable on current meds. Continue same.  DM2- stable/improved. Management per endocrinology.  Hyperlipidemia- tolerating statin, obtain follow up lipid panel.    I discussed the assessment and treatment plan with the patient. The patient was provided an opportunity to ask questions and all were answered. The  patient agreed with the plan and demonstrated an understanding of the instructions.   The patient was advised to call back or seek an in-person evaluation if the symptoms worsen or if the condition fails to improve as anticipated.  Nance Pear, NP

## 2019-05-18 ENCOUNTER — Other Ambulatory Visit: Payer: Self-pay

## 2019-05-18 ENCOUNTER — Other Ambulatory Visit (INDEPENDENT_AMBULATORY_CARE_PROVIDER_SITE_OTHER): Payer: BC Managed Care – PPO

## 2019-05-18 DIAGNOSIS — D509 Iron deficiency anemia, unspecified: Secondary | ICD-10-CM

## 2019-05-18 DIAGNOSIS — R319 Hematuria, unspecified: Secondary | ICD-10-CM

## 2019-05-18 LAB — URINALYSIS, ROUTINE W REFLEX MICROSCOPIC
Bilirubin Urine: NEGATIVE
Hgb urine dipstick: NEGATIVE
Ketones, ur: NEGATIVE
Nitrite: NEGATIVE
RBC / HPF: NONE SEEN (ref 0–?)
Specific Gravity, Urine: 1.02 (ref 1.000–1.030)
Total Protein, Urine: NEGATIVE
Urine Glucose: >=1000 — AB
Urobilinogen, UA: 0.2 (ref 0.0–1.0)
pH: 5.5 (ref 5.0–8.0)

## 2019-05-18 LAB — CBC WITH DIFFERENTIAL/PLATELET
Basophils Absolute: 0 10*3/uL (ref 0.0–0.1)
Basophils Relative: 0.4 % (ref 0.0–3.0)
Eosinophils Absolute: 0.1 10*3/uL (ref 0.0–0.7)
Eosinophils Relative: 3.2 % (ref 0.0–5.0)
HCT: 40.1 % (ref 39.0–52.0)
Hemoglobin: 13.2 g/dL (ref 13.0–17.0)
Lymphocytes Relative: 35.8 % (ref 12.0–46.0)
Lymphs Abs: 1.4 10*3/uL (ref 0.7–4.0)
MCHC: 32.9 g/dL (ref 30.0–36.0)
MCV: 83.9 fl (ref 78.0–100.0)
Monocytes Absolute: 0.3 10*3/uL (ref 0.1–1.0)
Monocytes Relative: 8.7 % (ref 3.0–12.0)
Neutro Abs: 2.1 10*3/uL (ref 1.4–7.7)
Neutrophils Relative %: 51.9 % (ref 43.0–77.0)
Platelets: 221 10*3/uL (ref 150.0–400.0)
RBC: 4.78 Mil/uL (ref 4.22–5.81)
RDW: 15.9 % — ABNORMAL HIGH (ref 11.5–15.5)
WBC: 4 10*3/uL (ref 4.0–10.5)

## 2019-05-18 LAB — FERRITIN: Ferritin: 57.2 ng/mL (ref 22.0–322.0)

## 2019-05-18 LAB — IRON: Iron: 67 ug/dL (ref 42–165)

## 2019-05-19 LAB — URINE CULTURE
MICRO NUMBER:: 10070860
SPECIMEN QUALITY:: ADEQUATE

## 2019-06-02 ENCOUNTER — Other Ambulatory Visit: Payer: Self-pay | Admitting: Family

## 2019-06-08 ENCOUNTER — Other Ambulatory Visit (HOSPITAL_BASED_OUTPATIENT_CLINIC_OR_DEPARTMENT_OTHER): Payer: BC Managed Care – PPO

## 2019-06-12 LAB — HEMOGLOBIN A1C: Hemoglobin A1C: 7

## 2019-06-18 ENCOUNTER — Telehealth: Payer: Self-pay | Admitting: Family

## 2019-06-18 NOTE — Telephone Encounter (Signed)
Patient advised copy will be at the front for him to pick up tomorrow

## 2019-06-18 NOTE — Telephone Encounter (Signed)
Pt would like a copy of his recent lab work. Please call patient when lab work is abv for pick up

## 2019-06-29 ENCOUNTER — Other Ambulatory Visit: Payer: Self-pay

## 2019-06-29 DIAGNOSIS — I10 Essential (primary) hypertension: Secondary | ICD-10-CM

## 2019-06-29 DIAGNOSIS — E7800 Pure hypercholesterolemia, unspecified: Secondary | ICD-10-CM

## 2019-06-30 ENCOUNTER — Other Ambulatory Visit: Payer: Self-pay | Admitting: Family

## 2019-07-02 ENCOUNTER — Other Ambulatory Visit: Payer: Self-pay

## 2019-07-02 ENCOUNTER — Other Ambulatory Visit (INDEPENDENT_AMBULATORY_CARE_PROVIDER_SITE_OTHER): Payer: BC Managed Care – PPO

## 2019-07-02 DIAGNOSIS — I10 Essential (primary) hypertension: Secondary | ICD-10-CM | POA: Diagnosis not present

## 2019-07-02 DIAGNOSIS — E7800 Pure hypercholesterolemia, unspecified: Secondary | ICD-10-CM | POA: Diagnosis not present

## 2019-07-02 LAB — COMPREHENSIVE METABOLIC PANEL
ALT: 26 U/L (ref 0–53)
AST: 21 U/L (ref 0–37)
Albumin: 4 g/dL (ref 3.5–5.2)
Alkaline Phosphatase: 65 U/L (ref 39–117)
BUN: 17 mg/dL (ref 6–23)
CO2: 31 mEq/L (ref 19–32)
Calcium: 8.8 mg/dL (ref 8.4–10.5)
Chloride: 103 mEq/L (ref 96–112)
Creatinine, Ser: 1.01 mg/dL (ref 0.40–1.50)
GFR: 88.77 mL/min (ref >60.00)
Glucose, Bld: 107 mg/dL — ABNORMAL HIGH (ref 70–99)
Potassium: 3.4 mEq/L — ABNORMAL LOW (ref 3.5–5.1)
Sodium: 142 mEq/L (ref 135–145)
Total Bilirubin: 0.5 mg/dL (ref 0.2–1.2)
Total Protein: 6.3 g/dL (ref 6.0–8.3)

## 2019-07-02 LAB — LIPID PANEL
Cholesterol: 154 mg/dL (ref 0–200)
HDL: 42.7 mg/dL (ref >39.00)
LDL Cholesterol: 88 mg/dL (ref 0–99)
NonHDL: 111.76
Total CHOL/HDL Ratio: 4
Triglycerides: 119 mg/dL (ref 0.0–149.0)
VLDL: 23.8 mg/dL (ref 0.0–40.0)

## 2019-07-02 NOTE — Telephone Encounter (Signed)
Refilled furosemide and advised pt that he was due for a 1 month follow up in February. He states he no longer needs furosemide. Cancelled Rx with pharmacist and scheduled pt in person f/u on 07/10/19 at [place] -- does pt still need 1 month f/u since he states he no longer needs the furosemide?

## 2019-07-02 NOTE — Telephone Encounter (Signed)
Yes please, we need to follow up on his blood pressure.

## 2019-07-03 ENCOUNTER — Telehealth: Payer: Self-pay | Admitting: Family

## 2019-07-03 NOTE — Telephone Encounter (Signed)
Patient is requesting a printed copy of lab results to be put up front .

## 2019-07-04 NOTE — Telephone Encounter (Signed)
Patient advised copy of recent labs at front for pick up,

## 2019-07-05 ENCOUNTER — Ambulatory Visit: Payer: BC Managed Care – PPO | Attending: Internal Medicine

## 2019-07-05 ENCOUNTER — Other Ambulatory Visit: Payer: Self-pay | Admitting: Family

## 2019-07-05 DIAGNOSIS — I10 Essential (primary) hypertension: Secondary | ICD-10-CM

## 2019-07-05 DIAGNOSIS — Z23 Encounter for immunization: Secondary | ICD-10-CM

## 2019-07-05 NOTE — Progress Notes (Signed)
   Covid-19 Vaccination Clinic  Name:  Keith Hardin    MRN: [id number] DOB: 04-15-1951  07/05/2019  [name] was observed post Covid-19 immunization for 15 minutes without incident. He was provided with Vaccine Information Sheet and instruction to access the V-Safe system.   [name] was instructed to call 911 with any severe reactions post vaccine: Marland Kitchen Difficulty breathing  . Swelling of face and throat  . A fast heartbeat  . A bad rash all over body  . Dizziness and weakness   Immunizations Administered    Name Date Dose VIS Date Route   Pfizer COVID-19 Vaccine 07/05/2019  2:28 PM 0.3 mL 04/06/2019 Intramuscular   Manufacturer: [place]   Lot: [id number]   [place]: [id number]

## 2019-07-10 ENCOUNTER — Other Ambulatory Visit: Payer: Self-pay

## 2019-07-10 ENCOUNTER — Ambulatory Visit: Payer: BC Managed Care – PPO | Admitting: Family

## 2019-07-10 ENCOUNTER — Encounter: Payer: Self-pay | Admitting: Family

## 2019-07-10 VITALS — BP 119/66 | HR 80 | Temp 96.3°F | Resp 18 | Ht 64.0 in | Wt 230.0 lb

## 2019-07-10 DIAGNOSIS — K219 Gastro-esophageal reflux disease without esophagitis: Secondary | ICD-10-CM

## 2019-07-10 DIAGNOSIS — E119 Type 2 diabetes mellitus without complications: Secondary | ICD-10-CM | POA: Diagnosis not present

## 2019-07-10 DIAGNOSIS — R609 Edema, unspecified: Secondary | ICD-10-CM | POA: Diagnosis not present

## 2019-07-10 DIAGNOSIS — E876 Hypokalemia: Secondary | ICD-10-CM | POA: Diagnosis not present

## 2019-07-10 DIAGNOSIS — R011 Cardiac murmur, unspecified: Secondary | ICD-10-CM

## 2019-07-10 DIAGNOSIS — E7800 Pure hypercholesterolemia, unspecified: Secondary | ICD-10-CM

## 2019-07-10 NOTE — Progress Notes (Signed)
Subjective:    Patient ID: Keith Hardin, male    DOB: 19-May-1950, 69 y.o.   MRN: [id number]  HPI  Patient is a 69 yr old male who presents today for follow up.  C/o LE edema- he complained of same last year at this time. We ordered a 2D echo which was not complete.   Wt Readings from Last 3 Encounters:  07/10/19 230 lb (104.3 kg)  02/23/19 233 lb 4 oz (105.8 kg)  12/14/18 229 lb 2 oz (103.9 kg)    Hyperlipidemia- maintained on pravastatin.  Lab Results  Component Value Date   CHOL 154 07/02/2019   HDL 42.70 07/02/2019   LDLCALC 88 07/02/2019   TRIG 119.0 07/02/2019   CHOLHDL 4 07/02/2019   DM2- following with endocrinology (Autumn Ronnald Ramp PA-C).   GERD- reports symptoms stable on otc zegerid.   Hematuria- UA 05/18/19 negative for microscopic blood.   HTN-  BP Readings from Last 3 Encounters:  07/10/19 119/66  02/23/19 134/61  12/14/18 (!) 114/47   Review of Systems See HPI  Past Medical History:  Diagnosis Date  . Allergy   . Colon polyps   . Diabetes mellitus    type II  . GERD (gastroesophageal reflux disease)   . Glaucoma   . Heart murmur   . Hyperlipidemia   . Hypertension   . Hypogonadism male 05/02/2011  . Orchitis and epididymitis 01/2008   left with left hydrocele     Social History   Socioeconomic History  . Marital status: Married    Spouse name: [name]  . Number of children: 0  . Years of education: Not on file  . Highest education level: Not on file  Occupational History  . Occupation: Merchandiser, retail: UNEMPLOYED  Tobacco Use  . Smoking status: Never Smoker  . Smokeless tobacco: Never Used  Substance and Sexual Activity  . Alcohol use: No  . Drug use: No  . Sexual activity: Not on file  Other Topics Concern  . 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 Difficulty of Paying Living Expenses:   Food Insecurity:   . Worried About Charity fundraiser in the Last Year:   . Arboriculturist in the Last Year:   Transportation Needs:   . Film/video editor (Medical):   Marland Kitchen Lack of Transportation (Non-Medical):   Physical Activity:   . Days of Exercise per Week:   . Minutes of Exercise per Session:   Stress:   . Feeling of Stress :   Social Connections:   . Frequency of Communication with Friends and Family:   . Frequency of Social Gatherings with Friends and Family:   . Attends Religious Services:   . Active Member of Clubs or Organizations:   . Attends Archivist Meetings:   Marland Kitchen Marital Status:   Intimate Partner Violence:   . Fear of Current or Ex-Partner:   . Emotionally Abused:   Marland Kitchen Physically Abused:   . Sexually Abused:     Past Surgical History:  Procedure Laterality Date  . ACHILLES TENDON REPAIR     right    Family History  Problem Relation Age of Onset  . Seizures Mother   . Hypertension Mother   .  Cancer Brother        stomach  . Hypothyroidism Sister   . Hypertension Brother   . Colon cancer Neg Hx   . Diabetes Neg Hx   . Heart attack Neg Hx   . Hyperlipidemia Neg Hx   . Sudden death Neg Hx     No Known Allergies  Current Outpatient Medications on File Prior to Visit  Medication Sig Dispense Refill  . amLODipine (NORVASC) 5 MG tablet TAKE 1 TABLET BY MOUTH EVERY DAY 90 tablet 0  . aspirin 81 MG tablet Take 81 mg by mouth daily.      Marland Kitchen azelastine (ASTELIN) 0.1 % nasal spray INSTILL 2 SPRAYS INTO BOTH NOSTRILS AT BEDTIME AS NEEDED FOR RHINITIS AS DIRECTED 90 mL 1  . bimatoprost (LUMIGAN) 0.03 % ophthalmic solution     . brimonidine (ALPHAGAN) 0.2 % ophthalmic solution INSTILL 1 DROP INTO AFFECTED EYE(S) BY OPHTHALMIC ROUTE EVERY 8 HOURS  6  . cloNIDine (CATAPRES) 0.3 MG tablet TAKE 1 TABLET(0.3 MG) BY MOUTH TWICE DAILY 180 tablet 1  . dapagliflozin propanediol (FARXIGA) 10 MG TABS  tablet Take 10 mg by mouth daily.    . dorzolamide-timolol (COSOPT) 22.3-6.8 MG/ML ophthalmic solution INSTILL 1 DROP INTO BOTH EYES TWICE A DAY  6  . Ferrous Sulfate (IRON) 325 (65 Fe) MG TABS Take 1 tablet by mouth daily. 30 each 0  . fexofenadine (ALLEGRA) 180 MG tablet Take 180 mg by mouth daily.    . fluconazole (DIFLUCAN) 150 MG tablet Take 1 tablet (150 mg total) by mouth daily. 1 tablet 0  . fluticasone (FLONASE) 50 MCG/ACT nasal spray Place 2 sprays into both nostrils daily.    . furosemide (LASIX) 20 MG tablet TAKE 1 TABLET BY MOUTH ONCE DAILY FOR 3 DAYS, THEN ONCE DAILY AS NEEDED FOR SWELLING 30 tablet 0  . glimepiride (AMARYL) 4 MG tablet Take by mouth.    Marland Kitchen glucose blood (ACCU-CHEK AVIVA) test strip Use to test blood sugar three times daily. 50 each 1  . hydrochlorothiazide (HYDRODIURIL) 25 MG tablet TAKE 1 TABLET(25 MG) BY MOUTH DAILY 90 tablet 1  . Insulin Pen Needle (BD PEN NEEDLE NANO U/F) 32G X 4 MM MISC USE AS DIRECTED TO INJECT INSULIN 100 each 4  . ketorolac (ACULAR) 0.5 % ophthalmic solution INSTILL 1 DROP INTO AFFECTED EYE(S) 4 TIMES DAILY  1  . Lancets (ACCU-CHEK MULTICLIX) lancets Use as directed 3 times daily to check blood sugar 102 each 2  . lisinopril (ZESTRIL) 40 MG tablet TAKE 1 TABLET(40 MG) BY MOUTH DAILY 90 tablet 1  . Loteprednol Etabonate (LOTEMAX) 0.5 % GEL INSTILL 1 DROP INTO BOTH EYES TWICE A DAY    . metFORMIN (GLUCOPHAGE) 1000 MG tablet Take 1,000 mg by mouth 2 (two) times daily with a meal.    . miconazole (MICOTIN) 2 % cream Apply 1 application topically 2 (two) times daily. 28.35 g 0  . Omega-3 Fatty Acids (FISH OIL) 1000 MG CAPS Take 1 capsule by mouth 2 (two) times daily.    Earney Navy Bicarbonate (ZEGERID OTC) 20-1100 MG CAPS capsule TAKE ONE CAPSULE BY MOUTH EVERY DAY BEFORE BREAKFAST 28 capsule 5  . OVER THE COUNTER MEDICATION NATURE'S FORMULA HEALING HERBS TEA for healthy living.  1/2 teaspoon daily in water.    Marland Kitchen OVER THE COUNTER  MEDICATION NATURE'S FORMULA.  CLEANSING HERBS for healthy living.  1/2 teaspoon daily in water.    . pioglitazone (ACTOS) 30 MG tablet TAKE 1 TABLET(30 MG)  BY MOUTH DAILY 90 tablet 0  . pravastatin (PRAVACHOL) 80 MG tablet TAKE 1 TABLET(80 MG) BY MOUTH DAILY 90 tablet 1  . prednisoLONE acetate (PRED FORTE) 1 % ophthalmic suspension INSTILL 1 DROP INTO BOTH EYES 4 TIMES A DAY  6  . Semaglutide (RYBELSUS) 3 MG TABS Take 3 mg by mouth daily. 3mg  daily for 30 days, then increase to 7mg     . TRESIBA FLEXTOUCH 100 UNIT/ML SOPN FlexTouch Pen   2   No current facility-administered medications on file prior to visit.    BP 119/66 (BP Location: Right Arm, Patient Position: Sitting, Cuff Size: Large)   Pulse 80   Temp (!) 96.3 F (35.7 C) (Temporal)   Resp 18   Ht 5\' 4"  (1.626 m)   Wt 230 lb (104.3 kg)   SpO2 100%   BMI 39.48 kg/m       Objective:   Physical Exam Constitutional:      General: He is not in acute distress.    Appearance: He is well-developed.  HENT:     Head: Normocephalic and atraumatic.  Cardiovascular:     Rate and Rhythm: Normal rate and regular rhythm.     Heart sounds: Murmur (grade 1 systolic) present.  Pulmonary:     Effort: Pulmonary effort is normal. No respiratory distress.     Breath sounds: Normal breath sounds. No wheezing or rales.  Musculoskeletal:     Right lower leg: 1+ Edema present.     Left lower leg: 1+ Edema present.  Skin:    General: Skin is warm and dry.  Neurological:     Mental Status: He is alert and oriented to person, place, and time.  Psychiatric:        Behavior: Behavior normal.        Thought Content: Thought content normal.            Assessment & Plan:    HTN- BP stable on current regimen.  Continue same.  DM2- following with Endo. Last A1C 7.0 06/12/19.   Hyperlipidemia- LDL at goal. Continue statin.  Hypokalemia- noted on most recent labs. Obtain follow up bmet.   GERD- stable on otc zegerid.  Edema- likely  secondary to amlodipine. Check 2D echo.   This visit occurred during the SARS-CoV-2 public health emergency.  Safety protocols were in place, including screening questions prior to the visit, additional usage of staff PPE, and extensive cleaning of exam room while observing appropriate contact time as indicated for disinfecting solutions.

## 2019-07-10 NOTE — Patient Instructions (Signed)
Please complete lab work prior to leaving.   

## 2019-07-11 ENCOUNTER — Encounter: Payer: Self-pay | Admitting: Family

## 2019-07-11 LAB — BASIC METABOLIC PANEL
BUN: 19 mg/dL (ref 6–23)
CO2: 29 mEq/L (ref 19–32)
Calcium: 9.1 mg/dL (ref 8.4–10.5)
Chloride: 105 mEq/L (ref 96–112)
Creatinine, Ser: 1.09 mg/dL (ref 0.40–1.50)
GFR: 81.29 mL/min (ref >60.00)
Glucose, Bld: 146 mg/dL — ABNORMAL HIGH (ref 70–99)
Potassium: 3.6 mEq/L (ref 3.5–5.1)
Sodium: 143 mEq/L (ref 135–145)

## 2019-07-13 NOTE — Progress Notes (Signed)
Mailed out to pt 

## 2019-07-16 ENCOUNTER — Ambulatory Visit (HOSPITAL_BASED_OUTPATIENT_CLINIC_OR_DEPARTMENT_OTHER)
Admission: RE | Admit: 2019-07-16 | Discharge: 2019-07-16 | Disposition: A | Discharge status: Home / Self Care | Payer: BC Managed Care – PPO | Source: Ambulatory Visit | Attending: Family | Admitting: Family

## 2019-07-16 ENCOUNTER — Other Ambulatory Visit: Payer: Self-pay

## 2019-07-16 DIAGNOSIS — R609 Edema, unspecified: Secondary | ICD-10-CM | POA: Insufficient documentation

## 2019-07-16 DIAGNOSIS — R011 Cardiac murmur, unspecified: Secondary | ICD-10-CM | POA: Diagnosis not present

## 2019-07-16 DIAGNOSIS — R600 Localized edema: Secondary | ICD-10-CM | POA: Diagnosis not present

## 2019-07-16 NOTE — Progress Notes (Signed)
  Echocardiogram 2D Echocardiogram has been performed.  Keith Hardin 07/16/2019, 3:07 PM

## 2019-07-28 ENCOUNTER — Other Ambulatory Visit: Payer: Self-pay | Admitting: Family

## 2019-07-30 ENCOUNTER — Ambulatory Visit: Payer: BC Managed Care – PPO | Attending: Internal Medicine

## 2019-07-30 DIAGNOSIS — Z23 Encounter for immunization: Secondary | ICD-10-CM

## 2019-07-30 NOTE — Progress Notes (Signed)
   Covid-19 Vaccination Clinic  Name:  Keith Hardin    MRN: [id number] DOB: [birth year]  07/30/2019  Mr. Box was observed post Covid-19 immunization for 15 minutes without incident. He was provided with Vaccine Information Sheet and instruction to access the V-Safe system.   [name] was instructed to call 911 with any severe reactions post vaccine: Marland Kitchen Difficulty breathing  . Swelling of face and throat  . A fast heartbeat  . A bad rash all over body  . Dizziness and weakness   Immunizations Administered    Name Date Dose VIS Date Route   Pfizer COVID-19 Vaccine 07/30/2019  4:06 PM 0.3 mL 04/06/2019 Intramuscular   Manufacturer: [place]   Lot: [id number]   [place]: [id number]

## 2019-08-02 ENCOUNTER — Telehealth: Payer: Self-pay | Admitting: Family

## 2019-08-02 NOTE — Telephone Encounter (Signed)
Pt called office back and I spoke with Rod Holler and she stated pt could go be seen by a GI provider if he has seen one in the past or wait to see Melissa next week.  I relayed that to the pt as well as offered him an appt with one of our providers tomorrow here in our office.  Pt stated he wanted to contact GI after I reviewed his chart and let him know he had seen Dr. Ardis Hughs in GI in 2018.  I provided pt with the number for GI and told him to please let us know if we could assist him any further.

## 2019-08-02 NOTE — Telephone Encounter (Signed)
Caller : Skye Mcclammy  Call Back # [phone]  Concern : Blood in Stool   Patient states dark red blood in stool for three days.  Please advise

## 2019-08-03 ENCOUNTER — Encounter: Payer: Self-pay | Admitting: Family Medicine

## 2019-08-03 ENCOUNTER — Ambulatory Visit: Payer: BC Managed Care – PPO | Admitting: Family Medicine

## 2019-08-03 ENCOUNTER — Other Ambulatory Visit: Payer: Self-pay

## 2019-08-03 VITALS — BP 106/60 | HR 57 | Temp 97.8°F | Resp 18 | Ht 64.0 in | Wt 232.0 lb

## 2019-08-03 DIAGNOSIS — K644 Residual hemorrhoidal skin tags: Secondary | ICD-10-CM | POA: Diagnosis not present

## 2019-08-03 DIAGNOSIS — K625 Hemorrhage of anus and rectum: Secondary | ICD-10-CM | POA: Diagnosis not present

## 2019-08-03 LAB — CBC WITH DIFFERENTIAL/PLATELET
Basophils Absolute: 0 10*3/uL (ref 0.0–0.1)
Basophils Relative: 1.1 % (ref 0.0–3.0)
Eosinophils Absolute: 0.1 10*3/uL (ref 0.0–0.7)
Eosinophils Relative: 3.3 % (ref 0.0–5.0)
HCT: 39.6 % (ref 39.0–52.0)
Hemoglobin: 13 g/dL (ref 13.0–17.0)
Lymphocytes Relative: 31.3 % (ref 12.0–46.0)
Lymphs Abs: 1.4 10*3/uL (ref 0.7–4.0)
MCHC: 32.7 g/dL (ref 30.0–36.0)
MCV: 84.7 fl (ref 78.0–100.0)
Monocytes Absolute: 0.4 10*3/uL (ref 0.1–1.0)
Monocytes Relative: 8.7 % (ref 3.0–12.0)
Neutro Abs: 2.5 10*3/uL (ref 1.4–7.7)
Neutrophils Relative %: 55.6 % (ref 43.0–77.0)
Platelets: 225 10*3/uL (ref 150.0–400.0)
RBC: 4.67 Mil/uL (ref 4.22–5.81)
RDW: 15.7 % — ABNORMAL HIGH (ref 11.5–15.5)
WBC: 4.4 10*3/uL (ref 4.0–10.5)

## 2019-08-03 LAB — COMPREHENSIVE METABOLIC PANEL
ALT: 23 U/L (ref 0–53)
AST: 18 U/L (ref 0–37)
Albumin: 4.1 g/dL (ref 3.5–5.2)
Alkaline Phosphatase: 60 U/L (ref 39–117)
BUN: 16 mg/dL (ref 6–23)
CO2: 27 mEq/L (ref 19–32)
Calcium: 9.2 mg/dL (ref 8.4–10.5)
Chloride: 103 mEq/L (ref 96–112)
Creatinine, Ser: 0.99 mg/dL (ref 0.40–1.50)
GFR: 90.82 mL/min (ref >60.00)
Glucose, Bld: 215 mg/dL — ABNORMAL HIGH (ref 70–99)
Potassium: 3.7 mEq/L (ref 3.5–5.1)
Sodium: 143 mEq/L (ref 135–145)
Total Bilirubin: 0.4 mg/dL (ref 0.2–1.2)
Total Protein: 6.1 g/dL (ref 6.0–8.3)

## 2019-08-03 LAB — POC HEMOCCULT BLD/STL (OFFICE/1-CARD/DIAGNOSTIC)
Card #1 Date: 4092021
Fecal Occult Blood, POC: NEGATIVE

## 2019-08-03 NOTE — Telephone Encounter (Signed)
Please advise pt that I reviewed his record and see that he has an appointment scheduled with GI on 4/14. I do think he should be seen by one of our providers sooner please.  Can we try to get him in today? We need to make sure that he is not dangerously anemic.

## 2019-08-03 NOTE — Patient Instructions (Signed)

## 2019-08-03 NOTE — Addendum Note (Signed)
Addended by: Kem Boroughs D on: 08/03/2019 01:35 PM   Modules accepted: Orders

## 2019-08-03 NOTE — Progress Notes (Signed)
Patient ID: Keith Hardin, male    DOB: 01/18/1951  Age: 69 y.o. MRN: [id number]    Subjective:  Subjective  HPI Keith Hardin presents for blood in stool since Monday.  No blood today  No abd pain, no NVD.   He has had this before and had a colon with Dr Ardis Hughs.  He had polyps and it is being repeated in 2023.  Pt denies constipation / hemorrhoids .    He has an app with GI next week.   Review of Systems  Constitutional: Negative.   HENT: Negative for congestion, ear pain, hearing loss, nosebleeds, postnasal drip, rhinorrhea, sinus pressure, sneezing and tinnitus.   Eyes: Negative for photophobia, discharge, itching and visual disturbance.  Respiratory: Negative.   Cardiovascular: Negative.   Gastrointestinal: Positive for anal bleeding and blood in stool. Negative for abdominal distention, abdominal pain, constipation, diarrhea, nausea, rectal pain and vomiting.  Endocrine: Negative.   Genitourinary: Negative.   Musculoskeletal: Negative.   Skin: Negative.   Allergic/Immunologic: Negative.   Neurological: Negative for dizziness, weakness, light-headedness, numbness and headaches.  Psychiatric/Behavioral: Negative for agitation, confusion, decreased concentration, dysphoric mood, sleep disturbance and suicidal ideas. The patient is not nervous/anxious.     History Past Medical History:  Diagnosis Date  . Allergy   . Colon polyps   . Diabetes mellitus    type II  . GERD (gastroesophageal reflux disease)   . Glaucoma   . Heart murmur   . Hyperlipidemia   . Hypertension   . Hypogonadism male 05/02/2011  . Orchitis and epididymitis 01/2008   left with left hydrocele    He has a past surgical history that includes Achilles tendon repair.   His family history includes Cancer in his brother; Hypertension in his brother and mother; Hypothyroidism in his sister; Seizures in his mother.He reports that he has never smoked. He has never used smokeless tobacco. He reports that he  does not drink alcohol or use drugs.  Current Outpatient Medications on File Prior to Visit  Medication Sig Dispense Refill  . amLODipine (NORVASC) 5 MG tablet TAKE 1 TABLET BY MOUTH EVERY DAY. **NEED OFFICE VISIT FOR REFILLS** 90 tablet 0  . aspirin 81 MG tablet Take 81 mg by mouth daily.      Marland Kitchen azelastine (ASTELIN) 0.1 % nasal spray INSTILL 2 SPRAYS INTO BOTH NOSTRILS AT BEDTIME AS NEEDED FOR RHINITIS AS DIRECTED 90 mL 1  . bimatoprost (LUMIGAN) 0.03 % ophthalmic solution     . brimonidine (ALPHAGAN) 0.2 % ophthalmic solution INSTILL 1 DROP INTO AFFECTED EYE(S) BY OPHTHALMIC ROUTE EVERY 8 HOURS  6  . cloNIDine (CATAPRES) 0.3 MG tablet TAKE 1 TABLET(0.3 MG) BY MOUTH TWICE DAILY 180 tablet 1  . dapagliflozin propanediol (FARXIGA) 10 MG TABS tablet Take 10 mg by mouth daily.    . dorzolamide-timolol (COSOPT) 22.3-6.8 MG/ML ophthalmic solution INSTILL 1 DROP INTO BOTH EYES TWICE A DAY  6  . Ferrous Sulfate (IRON) 325 (65 Fe) MG TABS Take 1 tablet by mouth daily. 30 each 0  . fexofenadine (ALLEGRA) 180 MG tablet Take 180 mg by mouth daily.    . fluconazole (DIFLUCAN) 150 MG tablet Take 1 tablet (150 mg total) by mouth daily. 1 tablet 0  . fluticasone (FLONASE) 50 MCG/ACT nasal spray Place 2 sprays into both nostrils daily.    . furosemide (LASIX) 20 MG tablet TAKE 1 TABLET BY MOUTH ONCE DAILY FOR 3 DAYS, THEN ONCE DAILY AS NEEDED FOR SWELLING 30 tablet 0  .  glimepiride (AMARYL) 4 MG tablet Take by mouth.    Marland Kitchen glucose blood (ACCU-CHEK AVIVA) test strip Use to test blood sugar three times daily. 50 each 1  . hydrochlorothiazide (HYDRODIURIL) 25 MG tablet TAKE 1 TABLET(25 MG) BY MOUTH DAILY 90 tablet 1  . Insulin Pen Needle (BD PEN NEEDLE NANO U/F) 32G X 4 MM MISC USE AS DIRECTED TO INJECT INSULIN 100 each 4  . ketorolac (ACULAR) 0.5 % ophthalmic solution INSTILL 1 DROP INTO AFFECTED EYE(S) 4 TIMES DAILY  1  . Lancets (ACCU-CHEK MULTICLIX) lancets Use as directed 3 times daily to check blood sugar  102 each 2  . lisinopril (ZESTRIL) 40 MG tablet TAKE 1 TABLET(40 MG) BY MOUTH DAILY 90 tablet 1  . Loteprednol Etabonate (LOTEMAX) 0.5 % GEL INSTILL 1 DROP INTO BOTH EYES TWICE A DAY    . metFORMIN (GLUCOPHAGE) 1000 MG tablet Take 1,000 mg by mouth 2 (two) times daily with a meal.    . miconazole (MICOTIN) 2 % cream Apply 1 application topically 2 (two) times daily. 28.35 g 0  . Omega-3 Fatty Acids (FISH OIL) 1000 MG CAPS Take 1 capsule by mouth 2 (two) times daily.    Earney Navy Bicarbonate (ZEGERID OTC) 20-1100 MG CAPS capsule TAKE ONE CAPSULE BY MOUTH EVERY DAY BEFORE BREAKFAST 28 capsule 5  . OVER THE COUNTER MEDICATION NATURE'S FORMULA HEALING HERBS TEA for healthy living.  1/2 teaspoon daily in water.    Marland Kitchen OVER THE COUNTER MEDICATION NATURE'S FORMULA.  CLEANSING HERBS for healthy living.  1/2 teaspoon daily in water.    . pioglitazone (ACTOS) 30 MG tablet TAKE 1 TABLET(30 MG) BY MOUTH DAILY 90 tablet 0  . pravastatin (PRAVACHOL) 80 MG tablet TAKE 1 TABLET(80 MG) BY MOUTH DAILY 90 tablet 1  . prednisoLONE acetate (PRED FORTE) 1 % ophthalmic suspension INSTILL 1 DROP INTO BOTH EYES 4 TIMES A DAY  6  . Semaglutide (RYBELSUS) 3 MG TABS Take 3 mg by mouth daily. 3mg  daily for 30 days, then increase to 7mg     . TRESIBA FLEXTOUCH 100 UNIT/ML SOPN FlexTouch Pen   2   No current facility-administered medications on file prior to visit.     Objective:  Objective  Physical Exam Vitals and nursing note reviewed.  Constitutional:      General: He is sleeping.     Appearance: He is well-developed.  HENT:     Head: Normocephalic and atraumatic.  Eyes:     Pupils: Pupils are equal, round, and reactive to light.  Neck:     Thyroid: No thyromegaly.  Cardiovascular:     Rate and Rhythm: Normal rate and regular rhythm.     Heart sounds: No murmur.  Pulmonary:     Effort: Pulmonary effort is normal. No respiratory distress.     Breath sounds: Normal breath sounds. No wheezing or rales.    Chest:     Chest wall: No tenderness.  Abdominal:     General: There is no distension.     Palpations: There is no mass.     Tenderness: There is no abdominal tenderness. There is no guarding or rebound.     Hernia: No hernia is present.  Genitourinary:    Rectum: Guaiac result negative. External hemorrhoid present. No tenderness or anal fissure.  Musculoskeletal:        General: No tenderness.     Cervical back: Normal range of motion and neck supple.  Skin:    General: Skin is warm and  dry.  Neurological:     Mental Status: He is oriented to person, place, and time.  Psychiatric:        Behavior: Behavior normal.        Thought Content: Thought content normal.        Judgment: Judgment normal.    BP 106/60 (BP Location: Right Arm, Patient Position: Sitting, Cuff Size: Large)   Pulse (!) 57   Temp 97.8 F (36.6 C) (Temporal)   Resp 18   Ht 5\' 4"  (1.626 m)   Wt 232 lb (105.2 kg)   SpO2 96%   BMI 39.82 kg/m  Wt Readings from Last 3 Encounters:  08/03/19 232 lb (105.2 kg)  07/10/19 230 lb (104.3 kg)  02/23/19 233 lb 4 oz (105.8 kg)     Lab Results  Component Value Date   WBC 4.0 05/18/2019   HGB 13.2 05/18/2019   HCT 40.1 05/18/2019   PLT 221.0 05/18/2019   GLUCOSE 146 (H) 07/10/2019   CHOL 154 07/02/2019   TRIG 119.0 07/02/2019   HDL 42.70 07/02/2019   LDLCALC 88 07/02/2019   ALT 26 07/02/2019   AST 21 07/02/2019   NA 143 07/10/2019   K 3.6 07/10/2019   CL 105 07/10/2019   CREATININE 1.09 07/10/2019   BUN 19 07/10/2019   CO2 29 07/10/2019   TSH 0.91 02/18/2017   PSA 2.16 02/18/2017   HGBA1C 7.6 (H) 07/30/2015   MICROALBUR <0.7 07/30/2015    ECHOCARDIOGRAM COMPLETE  Result Date: 07/17/2019    ECHOCARDIOGRAM REPORT   Patient Name:   MAOR SEYMOUR Date of Exam: 07/16/2019 Medical Rec #:  [id number]        Height:       64.0 in Accession #:    [id number]       Weight:       230.0 lb Date of Birth:  09/19/1950        BSA:          2.076 m Patient Age:     20 years         BP:           119/66 mmHg Patient Gender: M                HR:           56 bpm. Exam Location:  [place] [facility] Procedure: 2D Echo, Cardiac Doppler, Color Doppler and Strain Analysis Indications:    Murmur,Chronic LE Edema  History:        Patient has prior history of Echocardiogram examinations, most                 recent 08/20/2015. Signs/Symptoms:Murmur; Risk Factors:Diabetes,                 Hypertension and Dyslipidemia.  Sonographer:    Cardell Peach RDCS (AE) Referring Phys: [place]  1. Left ventricular ejection fraction, by estimation, is 60 to 65%. The left ventricle has normal function. The left ventricle has no regional wall motion abnormalities. Left ventricular diastolic parameters are consistent with Grade I diastolic dysfunction (impaired relaxation).  2. Right ventricular systolic function is normal. The right ventricular size is normal. There is normal pulmonary artery systolic pressure.  3. The mitral valve is normal in structure. No evidence of mitral valve regurgitation. No evidence of mitral stenosis.  4. Increased velocities across AV noted on echo from 2017 are much improved on this study. The aortic valve is normal  in structure. Aortic valve regurgitation is not visualized. No aortic stenosis is present. Aortic valve area, by VTI measures 1.52 cm.  Aortic valve mean gradient measures 7.5 mmHg. Aortic valve Vmax measures 1.91 m/s.  5. The inferior vena cava is normal in size with greater than 50% respiratory variability, suggesting right atrial pressure of 3 mmHg. FINDINGS  Left Ventricle: Left ventricular ejection fraction, by estimation, is 60 to 65%. The left ventricle has normal function. The left ventricle has no regional wall motion abnormalities. The left ventricular internal cavity size was normal in size. There is  no left ventricular hypertrophy. Left ventricular diastolic parameters are consistent with Grade I diastolic dysfunction  (impaired relaxation). Right Ventricle: The right ventricular size is normal. No increase in right ventricular wall thickness. Right ventricular systolic function is normal. There is normal pulmonary artery systolic pressure. The tricuspid regurgitant velocity is 1.53 m/s, and  with an assumed right atrial pressure of 3 mmHg, the estimated right ventricular systolic pressure is 0000000 mmHg. Left Atrium: Left atrial size was normal in size. Right Atrium: Right atrial size was normal in size. Pericardium: There is no evidence of pericardial effusion. Mitral Valve: The mitral valve is normal in structure. Normal mobility of the mitral valve leaflets. No evidence of mitral valve regurgitation. No evidence of mitral valve stenosis. Tricuspid Valve: The tricuspid valve is normal in structure. Tricuspid valve regurgitation is not demonstrated. No evidence of tricuspid stenosis. Aortic Valve: Increased velocities across AV noted on echo from 2017 are much improved on this study. The aortic valve is normal in structure. Aortic valve regurgitation is not visualized. No aortic stenosis is present. Aortic valve mean gradient measures 7.5 mmHg. Aortic valve peak gradient measures 14.5 mmHg. Aortic valve area, by VTI measures 1.52 cm. Pulmonic Valve: The pulmonic valve was normal in structure. Pulmonic valve regurgitation is not visualized. No evidence of pulmonic stenosis. Aorta: The aortic root is normal in size and structure. Venous: The inferior vena cava is normal in size with greater than 50% respiratory variability, suggesting right atrial pressure of 3 mmHg. IAS/Shunts: No atrial level shunt detected by color flow Doppler.  LEFT VENTRICLE PLAX 2D LVIDd:         4.78 cm  Diastology LVIDs:         3.03 cm  LV e' lateral:   10.30 cm/s LV PW:         1.30 cm  LV E/e' lateral: 6.8 LV IVS:        1.03 cm  LV e' medial:    7.18 cm/s LVOT diam:     1.80 cm  LV E/e' medial:  9.8 LV SV:         60 LV SV Index:   29 LVOT Area:      2.54 cm  RIGHT VENTRICLE             IVC RV Basal diam:  4.43 cm     IVC diam: 1.41 cm RV S prime:     15.40 cm/s TAPSE (M-mode): 3.0 cm LEFT ATRIUM             Index       RIGHT ATRIUM           Index LA diam:        3.60 cm 1.73 cm/m  RA Area:     19.70 cm LA Vol (A2C):   40.6 ml 19.56 ml/m RA Volume:   61.50 ml  29.62 ml/m LA Vol (A4C):  43.3 ml 20.86 ml/m LA Biplane Vol: 45.1 ml 21.72 ml/m  AORTIC VALVE AV Area (Vmax):    1.40 cm AV Area (Vmean):   1.39 cm AV Area (VTI):     1.52 cm AV Vmax:           190.50 cm/s AV Vmean:          127.000 cm/s AV VTI:            0.392 m AV Peak Grad:      14.5 mmHg AV Mean Grad:      7.5 mmHg LVOT Vmax:         105.15 cm/s LVOT Vmean:        69.450 cm/s LVOT VTI:          0.235 m LVOT/AV VTI ratio: 0.60  AORTA Ao Root diam: 2.50 cm Ao Asc diam:  3.50 cm MITRAL VALVE               TRICUSPID VALVE MV Area (PHT): 2.36 cm    TR Peak grad:   9.4 mmHg MV Decel Time: 322 msec    TR Vmax:        153.00 cm/s MV E velocity: 70.20 cm/s MV A velocity: 93.10 cm/s  SHUNTS MV E/A ratio:  0.75        Systemic VTI:  0.24 m                            Systemic Diam: 1.80 cm Jenne Campus MD Electronically signed by Jenne Campus MD Signature Date/Time: 07/17/2019/12:26:44 PM    Final      Assessment & Plan:  Plan  I am having [name] maintain his aspirin, accu-chek multiclix, Fish Oil, fluticasone, fexofenadine, glucose blood, dapagliflozin propanediol, Omeprazole-Sodium Bicarbonate, Iron, pioglitazone, brimonidine, dorzolamide-timolol, ketorolac, prednisoLONE acetate, OVER THE COUNTER MEDICATION, OVER THE COUNTER MEDICATION, bimatoprost, Tresiba FlexTouch, Loteprednol Etabonate, azelastine, glimepiride, BD Pen Needle Nano U/F, pravastatin, metFORMIN, Rybelsus, fluconazole, miconazole, cloNIDine, lisinopril, furosemide, hydrochlorothiazide, and amLODipine.  No orders of the defined types were placed in this encounter.   Problem List Items Addressed This Visit       Unprioritized   External hemorrhoid    Inc fiber in diet Sitz baths Prep H / tucks F/u GI --  May have bee the cause of the BRBPR       Rectal bleed - Primary    con't zegrid F/u GI Check labs       Relevant Orders   CBC with Differential/Platelet   Comprehensive metabolic panel      Follow-up: Return if symptoms worsen or fail to improve.  Ann Held, DO

## 2019-08-03 NOTE — Telephone Encounter (Signed)
Patient saw Dr. Etter Sjogren this morning

## 2019-08-03 NOTE — Assessment & Plan Note (Signed)
Inc fiber in diet Sitz baths Prep H / tucks F/u GI --  May have bee the cause of the BRBPR

## 2019-08-03 NOTE — Assessment & Plan Note (Signed)
con't zegrid F/u GI Check labs

## 2019-08-08 ENCOUNTER — Ambulatory Visit: Payer: BC Managed Care – PPO | Admitting: Nurse Practitioner

## 2019-08-08 ENCOUNTER — Encounter: Payer: Self-pay | Admitting: Nurse Practitioner

## 2019-08-08 VITALS — BP 110/60 | HR 60 | Temp 98.2°F | Ht 64.0 in | Wt 229.0 lb

## 2019-08-08 DIAGNOSIS — K648 Other hemorrhoids: Secondary | ICD-10-CM | POA: Diagnosis not present

## 2019-08-08 DIAGNOSIS — K625 Hemorrhage of anus and rectum: Secondary | ICD-10-CM | POA: Diagnosis not present

## 2019-08-08 MED ORDER — HYDROCORTISONE (PERIANAL) 2.5 % EX CREA: 1.0000 "application " | TOPICAL_CREAM | Freq: Every day | CUTANEOUS | 1 refills | Status: DC

## 2019-08-08 NOTE — Progress Notes (Signed)
ASSESSMENT / PLAN:   69 year old male with PMH significant for diabetes, obesity, hyperlipidemia, GERD, colon polyps  #Recurrent, painless rectal bleeding --Internal and external hemorrhoids on exam today including anoscopy.  Suspect hemorrhoidal bleeding --Trial of Anusol cream per rectum nightly x10 nights. --64 ounces of water daily --Start daily Benefiber --Patient will call in about 10 days with an update.  I have given him information on internal hemorrhoid banding should bleeding persist   HPI:     Chief Complaint:  Rectal bleeding    Keith Hardin is a 69 year old male with PMH as described above.  He had a colonoscopy with Dr. Ardis Hughs June 2018 for evaluation of rectal bleeding.  Internal and external hemorrhoids were found.  Two small removed. Pathology compatible with tubular adenoma and an inflammatory polyp.  Follow-up colonoscopy recommended in 5 years.  Patient is here for evaluation of painless rectal bleeding which started Monday before last.    Saw PCP for the bleeding on 4/9, external hemorrhoid seen. He does not recall being constipated with straining nor hard stools prior to the onset of bleeding.  His bowel movements fluctuate between being loose and of normal consistency.  Initially the blood was a darker red then turned more bright red.  He takes a daily baby aspirin, no other NSAIDs.  No blood thinners.  He is on daily iron.  He has been using Preparation H (per rectum) and hasn't had any further bleeding in several days.  No abdominal pain or other GI complaints.  Labs on 08/03/2019 unremarkable including a CBC, c-Met normal  (except glucose was 215)  Data Reviewed:  08/03/2019 CBC normal.  Hemoglobin 13    Past Medical History:  Diagnosis Date  . Allergy   . Colon polyps   . Diabetes mellitus    type II  . GERD (gastroesophageal reflux disease)   . Glaucoma   . Heart murmur   . Hyperlipidemia   . Hypertension   . Hypogonadism male  05/02/2011  . Orchitis and epididymitis 01/2008   left with left hydrocele     Past Surgical History:  Procedure Laterality Date  . ACHILLES TENDON REPAIR     right   Family History  Problem Relation Age of Onset  . Seizures Mother   . Hypertension Mother   . Cancer Brother        stomach  . Hypothyroidism Sister   . Hypertension Brother   . Colon cancer Neg Hx   . Diabetes Neg Hx   . Heart attack Neg Hx   . Hyperlipidemia Neg Hx   . Sudden death Neg Hx    Social History   Tobacco Use  . Smoking status: Never Smoker  . Smokeless tobacco: Never Used  Substance Use Topics  . Alcohol use: No  . Drug use: No   Current Outpatient Medications  Medication Sig Dispense Refill  . amLODipine (NORVASC) 5 MG tablet TAKE 1 TABLET BY MOUTH EVERY DAY. **NEED OFFICE VISIT FOR REFILLS** 90 tablet 0  . aspirin 81 MG tablet Take 81 mg by mouth daily.      Marland Kitchen azelastine (ASTELIN) 0.1 % nasal spray INSTILL 2 SPRAYS INTO BOTH NOSTRILS AT BEDTIME AS NEEDED FOR RHINITIS AS DIRECTED 90 mL 1  . brimonidine (ALPHAGAN) 0.2 % ophthalmic solution INSTILL 1 DROP INTO AFFECTED EYE(S) BY OPHTHALMIC ROUTE EVERY 8 HOURS  6  . cloNIDine (CATAPRES) 0.3 MG tablet TAKE 1  TABLET(0.3 MG) BY MOUTH TWICE DAILY 180 tablet 1  . dapagliflozin propanediol (FARXIGA) 10 MG TABS tablet Take 10 mg by mouth daily.    . Ferrous Sulfate (IRON) 325 (65 Fe) MG TABS Take 1 tablet by mouth daily. 30 each 0  . fexofenadine (ALLEGRA) 180 MG tablet Take 180 mg by mouth daily.    . fluticasone (FLONASE) 50 MCG/ACT nasal spray Place 2 sprays into both nostrils daily.    . furosemide (LASIX) 20 MG tablet TAKE 1 TABLET BY MOUTH ONCE DAILY FOR 3 DAYS, THEN ONCE DAILY AS NEEDED FOR SWELLING 30 tablet 0  . glimepiride (AMARYL) 4 MG tablet Take by mouth.    Marland Kitchen glucose blood (ACCU-CHEK AVIVA) test strip Use to test blood sugar three times daily. 50 each 1  . hydrochlorothiazide (HYDRODIURIL) 25 MG tablet TAKE 1 TABLET(25 MG) BY MOUTH DAILY  90 tablet 1  . Insulin Pen Needle (BD PEN NEEDLE NANO U/F) 32G X 4 MM MISC USE AS DIRECTED TO INJECT INSULIN 100 each 4  . ketorolac (ACULAR) 0.5 % ophthalmic solution INSTILL 1 DROP INTO AFFECTED EYE(S) 4 TIMES DAILY  1  . Lancets (ACCU-CHEK MULTICLIX) lancets Use as directed 3 times daily to check blood sugar 102 each 2  . lisinopril (ZESTRIL) 40 MG tablet TAKE 1 TABLET(40 MG) BY MOUTH DAILY 90 tablet 1  . metFORMIN (GLUCOPHAGE) 1000 MG tablet Take 1,000 mg by mouth 2 (two) times daily with a meal.    . Omega-3 Fatty Acids (FISH OIL) 1000 MG CAPS Take 1 capsule by mouth 2 (two) times daily.    Earney Navy Bicarbonate (ZEGERID OTC) 20-1100 MG CAPS capsule TAKE ONE CAPSULE BY MOUTH EVERY DAY BEFORE BREAKFAST 28 capsule 5  . OVER THE COUNTER MEDICATION NATURE'S FORMULA HEALING HERBS TEA for healthy living.  1/2 teaspoon daily in water.    Marland Kitchen OVER THE COUNTER MEDICATION NATURE'S FORMULA.  CLEANSING HERBS for healthy living.  1/2 teaspoon daily in water.    . pioglitazone (ACTOS) 30 MG tablet TAKE 1 TABLET(30 MG) BY MOUTH DAILY 90 tablet 0  . pravastatin (PRAVACHOL) 80 MG tablet TAKE 1 TABLET(80 MG) BY MOUTH DAILY 90 tablet 1  . Semaglutide (RYBELSUS) 3 MG TABS Take 3 mg by mouth daily. 17m daily for 30 days, then increase to 729m   . TRESIBA FLEXTOUCH 100 UNIT/ML SOPN FlexTouch Pen   2  . hydrocortisone (ANUSOL-HC) 2.5 % rectal cream Place 1 application rectally at bedtime. Use for 10 days. 30 g 1   No current facility-administered medications for this visit.   No Known Allergies   Review of Systems: No chest pain, SOB. No urinary symptoms.    Serum creatinine: 0.99 mg/dL 08/03/19 1218 Estimated creatinine clearance: 77.9 mL/min   Physical Exam:    Wt Readings from Last 3 Encounters:  08/08/19 229 lb (103.9 kg)  08/03/19 232 lb (105.2 kg)  07/10/19 230 lb (104.3 kg)    BP 110/60 (BP Location: Left Arm, Patient Position: Sitting, Cuff Size: Normal)   Pulse 60   Temp 98.2 F  (36.8 C)   Ht _0  (1.626 m) Comment: height measured without shoes  Wt 229 lb (103.9 kg)   BMI 39.31 kg/m  Constitutional:  Pleasant male in no acute distress. Psychiatric: Normal mood and affect. Behavior is normal. EENT: Pupils normal.  Conjunctivae are normal. No scleral icterus. Neck supple.  Cardiovascular: Normal rate, regular rhythm, 1+ BLE edema Pulmonary/chest: Effort normal and breath sounds normal. No wheezing, rales  or rhonchi. Abdominal: Soft, protuberant, nontender. Bowel sounds active throughout. There are no masses palpable.  Neurological: Alert and oriented to person place and time. Skin: Skin is warm and dry. No rashes noted.  Tye Savoy, NP  08/08/2019, 1:25 PM  Cc:  Roma Schanz, MD

## 2019-08-08 NOTE — Patient Instructions (Signed)
If you are age 69 or older, your body mass index should be between 23-30. Your Body mass index is 39.31 kg/m. If this is out of the aforementioned range listed, please consider follow up with your Primary Care Provider.  If you are age 8 or younger, your body mass index should be between 19-25. Your Body mass index is 39.31 kg/m. If this is out of the aformentioned range listed, please consider follow up with your Primary Care Provider.   We have sent the following medications to your pharmacy for you to pick up at your convenience: Anusol cream  Drink 64 ounces of water daily.  Start Benefiber daily. (this is over-the-counter)  Call in ten days with an update.  Thank you for choosing me and [facility].   Tye Savoy, NP

## 2019-08-09 NOTE — Progress Notes (Signed)
I agree with the above note, plan 

## 2019-08-17 ENCOUNTER — Other Ambulatory Visit: Payer: Self-pay

## 2019-08-17 MED ORDER — PRAVASTATIN SODIUM 80 MG PO TABS: ORAL_TABLET | ORAL | 1 refills | Status: DC

## 2019-08-22 ENCOUNTER — Emergency Department (HOSPITAL_BASED_OUTPATIENT_CLINIC_OR_DEPARTMENT_OTHER)
Admission: EM | Admit: 2019-08-22 | Discharge: 2019-08-22 | Disposition: A | Discharge status: Home / Self Care | Payer: BC Managed Care – PPO | Attending: Emergency Medicine | Admitting: Emergency Medicine

## 2019-08-22 ENCOUNTER — Telehealth: Payer: Self-pay | Admitting: Family

## 2019-08-22 ENCOUNTER — Other Ambulatory Visit: Payer: Self-pay

## 2019-08-22 ENCOUNTER — Encounter (HOSPITAL_BASED_OUTPATIENT_CLINIC_OR_DEPARTMENT_OTHER): Payer: Self-pay | Admitting: Emergency Medicine

## 2019-08-22 DIAGNOSIS — I10 Essential (primary) hypertension: Secondary | ICD-10-CM | POA: Insufficient documentation

## 2019-08-22 DIAGNOSIS — R899 Unspecified abnormal finding in specimens from other organs, systems and tissues: Secondary | ICD-10-CM

## 2019-08-22 DIAGNOSIS — R2243 Localized swelling, mass and lump, lower limb, bilateral: Secondary | ICD-10-CM | POA: Insufficient documentation

## 2019-08-22 DIAGNOSIS — E119 Type 2 diabetes mellitus without complications: Secondary | ICD-10-CM | POA: Insufficient documentation

## 2019-08-22 DIAGNOSIS — E876 Hypokalemia: Secondary | ICD-10-CM | POA: Diagnosis not present

## 2019-08-22 DIAGNOSIS — R519 Headache, unspecified: Secondary | ICD-10-CM | POA: Insufficient documentation

## 2019-08-22 DIAGNOSIS — R140 Abdominal distension (gaseous): Secondary | ICD-10-CM | POA: Diagnosis not present

## 2019-08-22 DIAGNOSIS — Z79899 Other long term (current) drug therapy: Secondary | ICD-10-CM | POA: Insufficient documentation

## 2019-08-22 DIAGNOSIS — Z7984 Long term (current) use of oral hypoglycemic drugs: Secondary | ICD-10-CM | POA: Insufficient documentation

## 2019-08-22 DIAGNOSIS — R5383 Other fatigue: Secondary | ICD-10-CM | POA: Diagnosis not present

## 2019-08-22 LAB — CBC WITH DIFFERENTIAL/PLATELET
Abs Immature Granulocytes: 0.01 10*3/uL (ref 0.00–0.07)
Basophils Absolute: 0 10*3/uL (ref 0.0–0.1)
Basophils Relative: 0 %
Eosinophils Absolute: 0.2 10*3/uL (ref 0.0–0.5)
Eosinophils Relative: 4 %
HCT: 41.8 % (ref 39.0–52.0)
Hemoglobin: 13.5 g/dL (ref 13.0–17.0)
Immature Granulocytes: 0 %
Lymphocytes Relative: 32 %
Lymphs Abs: 1.6 10*3/uL (ref 0.7–4.0)
MCH: 27.2 pg (ref 26.0–34.0)
MCHC: 32.3 g/dL (ref 30.0–36.0)
MCV: 84.3 fL (ref 80.0–100.0)
Monocytes Absolute: 0.4 10*3/uL (ref 0.1–1.0)
Monocytes Relative: 8 %
Neutro Abs: 2.7 10*3/uL (ref 1.7–7.7)
Neutrophils Relative %: 56 %
Platelets: 253 10*3/uL (ref 150–400)
RBC: 4.96 MIL/uL (ref 4.22–5.81)
RDW: 14.1 % (ref 11.5–15.5)
WBC: 4.9 10*3/uL (ref 4.0–10.5)
nRBC: 0 % (ref 0.0–0.2)

## 2019-08-22 LAB — URINALYSIS, ROUTINE W REFLEX MICROSCOPIC
Bilirubin Urine: NEGATIVE
Glucose, UA: >=500 mg/dL — AB
Hgb urine dipstick: NEGATIVE
Ketones, ur: NEGATIVE mg/dL
Leukocytes,Ua: NEGATIVE
Nitrite: NEGATIVE
Protein, ur: NEGATIVE mg/dL
Specific Gravity, Urine: 1.01 (ref 1.005–1.030)
pH: 6 (ref 5.0–8.0)

## 2019-08-22 LAB — COMPREHENSIVE METABOLIC PANEL
ALT: 27 U/L (ref 0–44)
AST: 25 U/L (ref 15–41)
Albumin: 4 g/dL (ref 3.5–5.0)
Alkaline Phosphatase: 67 U/L (ref 38–126)
Anion gap: 12 (ref 5–15)
BUN: 21 mg/dL (ref 8–23)
CO2: 28 mmol/L (ref 22–32)
Calcium: 8.9 mg/dL (ref 8.9–10.3)
Chloride: 105 mmol/L (ref 98–111)
Creatinine, Ser: 1.2 mg/dL (ref 0.61–1.24)
GFR calc Af Amer: >60 mL/min (ref >60)
GFR calc non Af Amer: >60 mL/min (ref >60)
Glucose, Bld: 175 mg/dL — ABNORMAL HIGH (ref 70–99)
Potassium: 3.3 mmol/L — ABNORMAL LOW (ref 3.5–5.1)
Sodium: 145 mmol/L (ref 135–145)
Total Bilirubin: 0.5 mg/dL (ref 0.3–1.2)
Total Protein: 6.9 g/dL (ref 6.5–8.1)

## 2019-08-22 LAB — URINALYSIS, MICROSCOPIC (REFLEX)

## 2019-08-22 LAB — BRAIN NATRIURETIC PEPTIDE: B Natriuretic Peptide: 38.5 pg/mL (ref 0.0–100.0)

## 2019-08-22 MED ORDER — POTASSIUM CHLORIDE CRYS ER 20 MEQ PO TBCR
20.0000 meq | EXTENDED_RELEASE_TABLET | Freq: Two times a day (BID) | ORAL | 0 refills | Status: DC
Start: 2019-08-22 — End: 2019-08-28

## 2019-08-22 NOTE — Telephone Encounter (Signed)
CallerAdrick Schneiders  Call Back # [phone]   Subject :Low Potassium   Per Patient  He had blood drawn yesterday from [place]. Per his results he has low potassium per [facility] 2.4. Patient was instructed to got to ED for potassium shot, Patient is calling to get  Melissa option before going to ED.   Please Advise

## 2019-08-22 NOTE — ED Provider Notes (Signed)
[facility] EMERGENC[facility] DEPARTMENT Provider Note   CSN: [id number] Arrival date & time: 08/22/19  1107     History Chief Complaint  Patient presents with  . Abnormal Lab    low K    Keith Hardin is a 69 y.o. male.  69 y.o male with a PMH of DM, HTN, Hyperlipidemia presents to the ED sent in by PCP for abnormal lab.  According to patient, he had labs drawn about 2 days ago at the [place], was called in today for abnormal labs.  Patient reports waking up this morning with a slight headache, reports no previous history of migraines.  He also reports feeling slightly more fatigued today.  Has not had any other symptoms.  No nausea, vomiting, GI losses.  No prior history of kidney normalities.  No fever or other complaints. Of note, during evaluation patient does report his abdomen feels slightly more swollen, no prior history of alcohol abuse or heart failure.     The history is provided by the patient.  Abnormal Lab      Past Medical History:  Diagnosis Date  . Allergy   . Colon polyps   . Diabetes mellitus    type II  . GERD (gastroesophageal reflux disease)   . Glaucoma   . Heart murmur   . Hyperlipidemia   . Hypertension   . Hypogonadism male 05/02/2011  . Orchitis and epididymitis 01/2008   left with left hydrocele    Patient Active Problem List   Diagnosis Date Noted  . External hemorrhoid 08/03/2019  . Rectal bleed 08/03/2019  . Left foot pain 03/02/2018  . 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  . Obesity 11/15/2016  . Anemia, iron deficiency 10/27/2015  . Erectile dysfunction 10/27/2015  . Hyperlipidemia 10/21/2014  . Preventative health care 04/12/2014  . Blister of toe of right foot without infection 07/20/2013  . Mild aortic stenosis 07/05/2012  . GERD (gastroesophageal reflux disease) 07/30/2011  . Shoulder pain 07/05/2011  . Hypogonadism male 05/02/2011  . COLONIC POLYPS, HX OF 11/09/2007  . Diabetes type 2, controlled ([place]) 09/13/2006  .  Essential hypertension 09/13/2006  . Allergic rhinitis 09/13/2006    Past Surgical History:  Procedure Laterality Date  . ACHILLES TENDON REPAIR     right       Family History  Problem Relation Age of Onset  . Seizures Mother   . Hypertension Mother   . Cancer Brother        stomach  . Hypothyroidism Sister   . Hypertension Brother   . Colon cancer Neg Hx   . Diabetes Neg Hx   . Heart attack Neg Hx   . Hyperlipidemia Neg Hx   . Sudden death Neg Hx     Social History   Tobacco Use  . Smoking status: Never Smoker  . Smokeless tobacco: Never Used  Substance Use Topics  . Alcohol use: No  . Drug use: No    Home Medications Prior to Admission medications   Medication Sig Start Date End Date Taking? Authorizing Provider  amLODipine (NORVASC) 5 MG tablet TAKE 1 TABLET BY MOUTH EVERY DAY. **NEED OFFICE VISIT FOR REFILLS** 07/30/19   Debbrah Alar, NP  aspirin 81 MG tablet Take 81 mg by mouth daily.      [provider]  azelastine (ASTELIN) 0.1 % nasal spray INSTILL 2 SPRAYS INTO BOTH NOSTRILS AT BEDTIME AS NEEDED FOR RHINITIS AS DIRECTED 06/08/18   Debbrah Alar, NP  brimonidine (ALPHAGAN) 0.2 %  ophthalmic solution INSTILL 1 DROP INTO AFFECTED EYE(S) BY OPHTHALMIC ROUTE EVERY 8 HOURS 09/09/17   [provider]  cloNIDine (CATAPRES) 0.3 MG tablet TAKE 1 TABLET(0.3 MG) BY MOUTH TWICE DAILY 03/26/19   Debbrah Alar, NP  dapagliflozin propanediol (FARXIGA) 10 MG TABS tablet Take 10 mg by mouth daily. 08/20/16   [provider]  Ferrous Sulfate (IRON) 325 (65 Fe) MG TABS Take 1 tablet by mouth daily. 11/15/16   Debbrah Alar, NP  fexofenadine (ALLEGRA) 180 MG tablet Take 180 mg by mouth daily.    [provider]  fluticasone (FLONASE) 50 MCG/ACT nasal spray Place 2 sprays into both nostrils daily.    [provider]  furosemide (LASIX) 20 MG tablet TAKE 1 TABLET BY MOUTH ONCE DAILY FOR 3 DAYS, THEN ONCE DAILY AS  NEEDED FOR SWELLING 07/02/19   Debbrah Alar, NP  glimepiride (AMARYL) 4 MG tablet Take by mouth. 10/06/18   [provider]  glucose blood (ACCU-CHEK AVIVA) test strip Use to test blood sugar three times daily. 01/15/16   Debbrah Alar, NP  hydrochlorothiazide (HYDRODIURIL) 25 MG tablet TAKE 1 TABLET(25 MG) BY MOUTH DAILY 07/06/19   Debbrah Alar, NP  hydrocortisone (ANUSOL-HC) 2.5 % rectal cream Place 1 application rectally at bedtime. Use for 10 days. 08/08/19   Willia Craze, NP  Insulin Pen Needle (BD PEN NEEDLE NANO U/F) 32G X 4 MM MISC USE AS DIRECTED TO INJECT INSULIN 01/19/19   Wendling, Crosby Oyster, DO  ketorolac (ACULAR) 0.5 % ophthalmic solution INSTILL 1 DROP INTO AFFECTED EYE(S) 4 TIMES DAILY 09/27/17   [provider]  Lancets (ACCU-CHEK MULTICLIX) lancets Use as directed 3 times daily to check blood sugar 01/12/13   Debbrah Alar, NP  lisinopril (ZESTRIL) 40 MG tablet TAKE 1 TABLET(40 MG) BY MOUTH DAILY 04/11/19   Debbrah Alar, NP  metFORMIN (GLUCOPHAGE) 1000 MG tablet Take 1,000 mg by mouth 2 (two) times daily with a meal.    [provider]  Omega-3 Fatty Acids (FISH OIL) 1000 MG CAPS Take 1 capsule by mouth 2 (two) times daily.    [provider]  Omeprazole-Sodium Bicarbonate (ZEGERID OTC) 20-1100 MG CAPS capsule TAKE ONE CAPSULE BY MOUTH EVERY DAY BEFORE BREAKFAST 11/15/16   Debbrah Alar, NP  OVER THE COUNTER MEDICATION NATURE'S FORMULA HEALING HERBS TEA for healthy living.  1/2 teaspoon daily in water.    [provider]  OVER THE COUNTER MEDICATION NATURE'S FORMULA.  CLEANSING HERBS for healthy living.  1/2 teaspoon daily in water.    [provider]  pioglitazone (ACTOS) 30 MG tablet TAKE 1 TABLET(30 MG) BY MOUTH DAILY 11/17/16   Copland, Gay Filler, MD  potassium chloride SA (KLOR-CON) 20 MEQ tablet Take 1 tablet (20 mEq total) by mouth 2 (two) times daily for 5 days. 08/22/19 08/27/19  Janeece Fitting, PA-C  pravastatin (PRAVACHOL) 80 MG tablet TAKE 1 TABLET(80 MG) BY MOUTH DAILY 08/17/19   Debbrah Alar, NP  Semaglutide (RYBELSUS) 3 MG TABS Take 3 mg by mouth daily. 3mg  daily for 30 days, then increase to 7mg     [provider]  TRESIBA FLEXTOUCH 100 UNIT/ML SOPN FlexTouch Pen  12/27/17   [provider]    Allergies    Patient has no known allergies.  Review of Systems   Review of Systems  Constitutional: Positive for fatigue. Negative for fever.  HENT: Negative for sore throat.   Respiratory: Negative for shortness of breath.   Cardiovascular: Negative for chest pain.  Gastrointestinal: Negative for abdominal pain, diarrhea, nausea and vomiting.  Genitourinary: Negative for flank pain.  Musculoskeletal: Negative for back pain.  Neurological: Positive for headaches.  All other systems reviewed and are negative.   Physical Exam Updated Vital Signs BP 123/65 (BP Location: Right Arm)   Pulse (!) 58   Temp 98.3 F (36.8 C) (Oral)   Resp 16   Ht 5\' 4"  (1.626 m)   Wt 104.3 kg   SpO2 100%   BMI 39.48 kg/m   Physical Exam Vitals and nursing note reviewed.  Constitutional:      Appearance: Normal appearance.  HENT:     Head: Normocephalic and atraumatic.     Mouth/Throat:     Mouth: Mucous membranes are moist.  Eyes:     Pupils: Pupils are equal, round, and reactive to light.  Cardiovascular:     Rate and Rhythm: Normal rate.     Comments: BL pitting edema 2+, no calf tenderness.  Pulmonary:     Effort: Pulmonary effort is normal.     Breath sounds: No wheezing or rales.     Comments: No wheezing, rales  Chest:     Comments: No pain with palpation of the chest. Abdominal:     General: Abdomen is flat. Bowel sounds are decreased. There is distension.     Tenderness: There is no abdominal tenderness. There is no right CVA tenderness, left CVA tenderness, guarding or rebound.     Comments: The Ms. appears distended, bowel sounds are  slightly decreased, no tenderness to palpation or guarding on exam.  Musculoskeletal:     Cervical back: Normal range of motion and neck supple.     Right lower leg: 2+ Edema present.     Left lower leg: 2+ Edema present.  Skin:    General: Skin is warm and dry.  Neurological:     Mental Status: He is oriented to person, place, and time.     ED Results / Procedures / Treatments   Labs (all labs ordered are listed, but only abnormal results are displayed) Labs Reviewed  COMPREHENSIVE METABOLIC PANEL - Abnormal; Notable for the following components:      Result Value   Potassium 3.3 (*)    Glucose, Bld 175 (*)    All other components within normal limits  URINALYSIS, ROUTINE W REFLEX MICROSCOPIC - Abnormal; Notable for the following components:   Glucose, UA >=500 (*)    All other components within normal limits  URINALYSIS, MICROSCOPIC (REFLEX) - Abnormal; Notable for the following components:   Bacteria, UA RARE (*)    All other components within normal limits  CBC WITH DIFFERENTIAL/PLATELET  BRAIN NATRIURETIC PEPTIDE    EKG EKG Interpretation  Date/Time:  Wednesday August 22 2019 11:25:54 EDT Ventricular Rate:  62 PR Interval:    QRS Duration: 92 QT Interval:  413 QTC Calculation: 420 R Axis:   -29 Text Interpretation: Sinus rhythm Prolonged PR interval Left ventricular hypertrophy Borderline T abnormalities, diffuse leads No old tracing to compare Confirmed by Dorie Rank [id number]) on 08/22/2019 11:29:51 AM   Radiology No results found.  Procedures Procedures (including critical care time)  Medications Ordered in ED Medications - No data to display  ED Course  I have reviewed the triage vital signs and the nursing notes.  Pertinent labs & imaging results that were available during my care of the patient were reviewed by me and considered in my medical decision making (see chart for details).    MDM  Rules/Calculators/A&P   Patient with a past medical history  of diabetes, hypertension, hyperlipidemia presents to the ED with complaints of abnormal lab.  According to his records which have extensively reviewed, he was called by his PCP this morning for an abnormal potassium found to be between 2.1 and 2.4.  Patient was advised to seek care in the ED.  He does endorse fatigue along with a headache but has not been having any other symptoms.  No prior history of alcohol abuse, no nausea, vomiting, diarrhea, no prior history of heart failure.  During evaluation patient is well-appearing, vitals are within normal limits, lungs are clear to auscultation, abdomen appears somewhat distended with decreased bowel sounds but reports no pain in his abdomen region or diarrhea.  He does have 2+ pitting edema to bilateral lower extremities, no calf tenderness noted with this.  Will obtain repeat labs to further evaluate patient's condition.  Did rotation of his labs by me, CMP with mild hypokalemia at 3.3, creatinine levels within normal limits, LFTs are unremarkable.  Glucose slightly elevated but patient does have a history of diabetes.  CBC without any leukocytosis, no signs of anemia.  BMP was obtained to further evaluate as patient does have clinical signs of heart failure on my evaluation.  This is negative on today's visit.  UA is currently pending.  Patient is currently asymptomatic potassium is within his normal limits, will provide him with a outpatient oral load of potassium, will also recommend he follows up with his primary care physician as needed. UA with elevated glucose, no nitrites, leukocytes or signs of infection.  These results were discussed with patient at length.  He is encouraged to follow-up with his PCP, will go home on oral supplementation of potassium.  Vitals are within normal limits, patient stable for discharge.    Portions of this note were generated with Lobbyist. Dictation errors may occur despite best attempts at  proofreading.  Final Clinical Impression(s) / ED Diagnoses Final diagnoses:  Abnormal laboratory test  Hypokalemia    Rx / DC Orders ED Discharge Orders         Ordered    potassium chloride SA (KLOR-CON) 20 MEQ tablet  2 times daily     08/22/19 [address] PA-C 08/22/19 1415    Dorie Rank, MD 08/22/19 1438

## 2019-08-22 NOTE — Discharge Instructions (Signed)
Your laboratory results show that your potassium level on today's visit was 3.3, I have prescribed supplemental potassium for you to take at home.  Please take 1 tablet twice a day for the next 5 days.    Please schedule an appointment with your primary care physician in order to further evaluate your 1 episode of hypokalemia.

## 2019-08-22 NOTE — ED Triage Notes (Signed)
Pt reports had lab done yesterday at the [place], low potassium was called to him and recommended to come to the ER. No symptoms.

## 2019-08-22 NOTE — Telephone Encounter (Signed)
Patient at the er

## 2019-08-22 NOTE — Telephone Encounter (Signed)
Patient returned call to the office.  I relayed message from [place].  Patient stated he would go to the ER for evaluation.

## 2019-08-22 NOTE — Telephone Encounter (Signed)
I agree- he should go to the ER since his potassium is dangerously low.

## 2019-08-22 NOTE — Telephone Encounter (Signed)
Spoke to patient's wife and she reports that he is at work. She also said he told her he "was trying to leave work to go to the emergency room".  I strongly advised her to contact him asap and let him know that Melissa's advise is the same as the [facility], to go to the er for treatment. She verbalized understanding.

## 2019-08-28 ENCOUNTER — Encounter: Payer: Self-pay | Admitting: Family

## 2019-08-28 ENCOUNTER — Other Ambulatory Visit: Payer: Self-pay

## 2019-08-28 ENCOUNTER — Ambulatory Visit: Payer: BC Managed Care – PPO | Admitting: Family

## 2019-08-28 VITALS — BP 134/76 | HR 61 | Temp 97.0°F | Resp 16 | Ht 64.0 in | Wt 229.0 lb

## 2019-08-28 DIAGNOSIS — E876 Hypokalemia: Secondary | ICD-10-CM

## 2019-08-28 MED ORDER — POTASSIUM CHLORIDE CRYS ER 20 MEQ PO TBCR: 20.0000 meq | EXTENDED_RELEASE_TABLET | Freq: Two times a day (BID) | ORAL | 3 refills | Status: DC

## 2019-08-28 NOTE — Progress Notes (Signed)
Subjective:    Patient ID: Keith Hardin, male    DOB: November 01, 1950, 69 y.o.   MRN: [id number]  HPI  Patient is a 69 yr old male who presents today for hospital follow up.  He was sent to the ED on 08/22/19 by the [facility].  Apparently, at the [facility] [place] his potassium was 2.6 (2  Days prior).   He presented to the ED and follow up level there was 3.3. He was given oral K+ and sent home on kdur 91meq bid. Reports good compliance with medication.    BP Readings from Last 3 Encounters:  08/28/19 134/76  08/22/19 118/74  08/08/19 110/60   Wt Readings from Last 3 Encounters:  08/28/19 229 lb (103.9 kg)  08/22/19 230 lb (104.3 kg)  08/08/19 229 lb (103.9 kg)      Review of Systems See HPI  Past Medical History:  Diagnosis Date  . Allergy   . Colon polyps   . Diabetes mellitus    type II  . GERD (gastroesophageal reflux disease)   . Glaucoma   . Heart murmur   . Hyperlipidemia   . Hypertension   . Hypogonadism male 05/02/2011  . Orchitis and epididymitis 01/2008   left with left hydrocele     Social History   Socioeconomic History  . Marital status: Married    Spouse name: [name]  . Number of children: 0  . Years of education: Not on file  . Highest education level: Not on file  Occupational History  . Occupation: Merchandiser, retail: UNEMPLOYED  Tobacco Use  . Smoking status: Never Smoker  . Smokeless tobacco: Never Used  Substance and Sexual Activity  . Alcohol use: No  . Drug use: No  . Sexual activity: Not on file  Other Topics Concern  . 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 Difficulty of Paying Living Expenses:   Food Insecurity:   . Worried About Charity fundraiser in the Last Year:   . Arboriculturist in the Last Year:   Transportation Needs:   . Film/video editor  (Medical):   Marland Kitchen Lack of Transportation (Non-Medical):   Physical Activity:   . Days of Exercise per Week:   . Minutes of Exercise per Session:   Stress:   . Feeling of Stress :   Social Connections:   . Frequency of Communication with Friends and Family:   . Frequency of Social Gatherings with Friends and Family:   . Attends Religious Services:   . Active Member of Clubs or Organizations:   . Attends Archivist Meetings:   Marland Kitchen Marital Status:   Intimate Partner Violence:   . Fear of Current or Ex-Partner:   . Emotionally Abused:   Marland Kitchen Physically Abused:   . Sexually Abused:     Past Surgical History:  Procedure Laterality Date  . ACHILLES TENDON REPAIR     right    Family History  Problem Relation Age of Onset  . Seizures Mother   . Hypertension Mother   . Cancer Brother        stomach  . Hypothyroidism Sister   . Hypertension Brother   . Colon cancer Neg Hx   . Diabetes Neg Hx   .  Heart attack Neg Hx   . Hyperlipidemia Neg Hx   . Sudden death Neg Hx     No Known Allergies  Current Outpatient Medications on File Prior to Visit  Medication Sig Dispense Refill  . amLODipine (NORVASC) 5 MG tablet TAKE 1 TABLET BY MOUTH EVERY DAY. **NEED OFFICE VISIT FOR REFILLS** 90 tablet 0  . aspirin 81 MG tablet Take 81 mg by mouth daily.      Marland Kitchen azelastine (ASTELIN) 0.1 % nasal spray INSTILL 2 SPRAYS INTO BOTH NOSTRILS AT BEDTIME AS NEEDED FOR RHINITIS AS DIRECTED 90 mL 1  . brimonidine (ALPHAGAN) 0.2 % ophthalmic solution INSTILL 1 DROP INTO AFFECTED EYE(S) BY OPHTHALMIC ROUTE EVERY 8 HOURS  6  . cloNIDine (CATAPRES) 0.3 MG tablet TAKE 1 TABLET(0.3 MG) BY MOUTH TWICE DAILY 180 tablet 1  . dapagliflozin propanediol (FARXIGA) 10 MG TABS tablet Take 10 mg by mouth daily.    . Ferrous Sulfate (IRON) 325 (65 Fe) MG TABS Take 1 tablet by mouth daily. 30 each 0  . fexofenadine (ALLEGRA) 180 MG tablet Take 180 mg by mouth daily.    . fluticasone (FLONASE) 50 MCG/ACT nasal spray  Place 2 sprays into both nostrils daily.    . furosemide (LASIX) 20 MG tablet TAKE 1 TABLET BY MOUTH ONCE DAILY FOR 3 DAYS, THEN ONCE DAILY AS NEEDED FOR SWELLING 30 tablet 0  . glimepiride (AMARYL) 4 MG tablet Take by mouth.    Marland Kitchen glucose blood (ACCU-CHEK AVIVA) test strip Use to test blood sugar three times daily. 50 each 1  . hydrochlorothiazide (HYDRODIURIL) 25 MG tablet TAKE 1 TABLET(25 MG) BY MOUTH DAILY 90 tablet 1  . hydrocortisone (ANUSOL-HC) 2.5 % rectal cream Place 1 application rectally at bedtime. Use for 10 days. 30 g 1  . Insulin Pen Needle (BD PEN NEEDLE NANO U/F) 32G X 4 MM MISC USE AS DIRECTED TO INJECT INSULIN 100 each 4  . ketorolac (ACULAR) 0.5 % ophthalmic solution INSTILL 1 DROP INTO AFFECTED EYE(S) 4 TIMES DAILY  1  . Lancets (ACCU-CHEK MULTICLIX) lancets Use as directed 3 times daily to check blood sugar 102 each 2  . lisinopril (ZESTRIL) 40 MG tablet TAKE 1 TABLET(40 MG) BY MOUTH DAILY 90 tablet 1  . metFORMIN (GLUCOPHAGE) 1000 MG tablet Take 1,000 mg by mouth 2 (two) times daily with a meal.    . Omega-3 Fatty Acids (FISH OIL) 1000 MG CAPS Take 1 capsule by mouth 2 (two) times daily.    Earney Navy Bicarbonate (ZEGERID OTC) 20-1100 MG CAPS capsule TAKE ONE CAPSULE BY MOUTH EVERY DAY BEFORE BREAKFAST 28 capsule 5  . OVER THE COUNTER MEDICATION NATURE'S FORMULA HEALING HERBS TEA for healthy living.  1/2 teaspoon daily in water.    Marland Kitchen OVER THE COUNTER MEDICATION NATURE'S FORMULA.  CLEANSING HERBS for healthy living.  1/2 teaspoon daily in water.    . pioglitazone (ACTOS) 30 MG tablet TAKE 1 TABLET(30 MG) BY MOUTH DAILY 90 tablet 0  . potassium chloride SA (KLOR-CON) 20 MEQ tablet Take 1 tablet (20 mEq total) by mouth 2 (two) times daily for 5 days. 10 tablet 0  . pravastatin (PRAVACHOL) 80 MG tablet TAKE 1 TABLET(80 MG) BY MOUTH DAILY 90 tablet 1  . Semaglutide (RYBELSUS) 3 MG TABS Take 3 mg by mouth daily. 3mg  daily for 30 days, then increase to 7mg     . TRESIBA  FLEXTOUCH 100 UNIT/ML SOPN FlexTouch Pen   2   No current facility-administered medications on file prior  to visit.    BP 134/76 (BP Location: Right Arm, Patient Position: Sitting, Cuff Size: Small)   Pulse 61   Temp (!) 97 F (36.1 C) (Temporal)   Resp 16   Ht 5\' 4"  (1.626 m)   Wt 229 lb (103.9 kg)   SpO2 100%   BMI 39.31 kg/m       Objective:   Physical Exam Constitutional:      General: He is not in acute distress.    Appearance: He is well-developed.  HENT:     Head: Normocephalic and atraumatic.  Cardiovascular:     Rate and Rhythm: Normal rate and regular rhythm.     Heart sounds: No murmur.  Pulmonary:     Effort: Pulmonary effort is normal. No respiratory distress.     Breath sounds: Normal breath sounds. No wheezing or rales.  Skin:    General: Skin is warm and dry.  Neurological:     Mental Status: He is alert and oriented to person, place, and time.  Psychiatric:        Behavior: Behavior normal.        Thought Content: Thought content normal.           Assessment & Plan:  Hypokalemia-Clinically stable. Will repeat bmet, mag.  Continue Kdur.  Further recommendations pending results review.   This visit occurred during the SARS-CoV-2 public health emergency.  Safety protocols were in place, including screening questions prior to the visit, additional usage of staff PPE, and extensive cleaning of exam room while observing appropriate contact time as indicated for disinfecting solutions.

## 2019-08-28 NOTE — Patient Instructions (Signed)
Please complete lab work prior to leaving.   

## 2019-08-29 ENCOUNTER — Telehealth: Payer: Self-pay | Admitting: Family

## 2019-08-29 LAB — BASIC METABOLIC PANEL
BUN: 17 mg/dL (ref 6–23)
CO2: 29 mEq/L (ref 19–32)
Calcium: 9.2 mg/dL (ref 8.4–10.5)
Chloride: 103 mEq/L (ref 96–112)
Creatinine, Ser: 1.05 mg/dL (ref 0.40–1.50)
GFR: 84.84 mL/min (ref >60.00)
Glucose, Bld: 123 mg/dL — ABNORMAL HIGH (ref 70–99)
Potassium: 3.8 mEq/L (ref 3.5–5.1)
Sodium: 142 mEq/L (ref 135–145)

## 2019-08-29 LAB — MAGNESIUM: Magnesium: 2.2 mg/dL (ref 1.5–2.5)

## 2019-08-29 NOTE — Telephone Encounter (Signed)
Potassium level looks good. He should continue kdur 71meq twice daily. Follow up in 3 months as scheduled.

## 2019-08-29 NOTE — Telephone Encounter (Signed)
Lvm for patient to call back about resutls

## 2019-08-30 NOTE — Telephone Encounter (Signed)
Patient notified of results.

## 2019-08-31 ENCOUNTER — Telehealth: Payer: Self-pay | Admitting: Family

## 2019-08-31 NOTE — Telephone Encounter (Signed)
Patient reports he goes to the [facility] in [place].

## 2019-08-31 NOTE — Telephone Encounter (Signed)
Patient states that his [facility] doctor is requesting a copy of our follow up bmet.  Can you please verify name and number of his [facility] doctor with pt and then fax results?  tks

## 2019-08-31 NOTE — Telephone Encounter (Signed)
Noted  

## 2019-08-31 NOTE — Telephone Encounter (Signed)
Caller : Anderw Cho  Call Back # [phone]  Just wanted to give information for FAX # [phone]  [facility]

## 2019-08-31 NOTE — Telephone Encounter (Signed)
Caller : Keith Hardin   Call Back # [phone]   Just wanted to give information for FAX # [phone]   [facility]

## 2019-09-03 NOTE — Telephone Encounter (Signed)
Records faxed.

## 2019-09-14 ENCOUNTER — Other Ambulatory Visit: Payer: Self-pay | Admitting: Family

## 2019-09-21 ENCOUNTER — Telehealth: Payer: Self-pay | Admitting: Family

## 2019-09-21 MED ORDER — CLONIDINE HCL 0.3 MG PO TABS: ORAL_TABLET | ORAL | 1 refills | Status: DC

## 2019-09-21 NOTE — Telephone Encounter (Signed)
Rx was sent  

## 2019-09-21 NOTE — Telephone Encounter (Signed)
Medication: cloNIDine (CATAPRES) 0.3 MG tablet [id number]       Has the patient contacted their pharmacy?  (If no, request that the patient contact the pharmacy for the refill.) (If yes, when and what did the pharmacy advise?)     Preferred Pharmacy (with phone number or street name): [id number] - [place], [place] - [address]  [address], [place] [address]  Phone:  [phone] Fax:  [phone]      Agent: Please be advised that RX refills may take up to 3 business days. We ask that you follow-up with your pharmacy.

## 2019-10-05 ENCOUNTER — Telehealth: Payer: Self-pay | Admitting: *Deleted

## 2019-10-05 MED ORDER — LISINOPRIL 40 MG PO TABS: ORAL_TABLET | ORAL | 0 refills | Status: DC

## 2019-10-05 NOTE — Telephone Encounter (Signed)
Received request from [facility] for Lisinopril 40mg . Refill sent.

## 2019-10-12 ENCOUNTER — Ambulatory Visit: Payer: BC Managed Care – PPO | Admitting: Family

## 2019-10-17 ENCOUNTER — Other Ambulatory Visit: Payer: Self-pay | Admitting: Family

## 2019-12-13 LAB — HM DIABETES EYE EXAM

## 2019-12-14 ENCOUNTER — Other Ambulatory Visit: Payer: Self-pay

## 2019-12-14 ENCOUNTER — Ambulatory Visit (INDEPENDENT_AMBULATORY_CARE_PROVIDER_SITE_OTHER): Payer: BC Managed Care – PPO | Admitting: Family

## 2019-12-14 ENCOUNTER — Encounter: Payer: Self-pay | Admitting: Family

## 2019-12-14 VITALS — BP 109/57 | HR 58 | Temp 98.1°F | Resp 16 | Ht 64.0 in | Wt 224.0 lb

## 2019-12-14 DIAGNOSIS — E611 Iron deficiency: Secondary | ICD-10-CM | POA: Diagnosis not present

## 2019-12-14 DIAGNOSIS — E7800 Pure hypercholesterolemia, unspecified: Secondary | ICD-10-CM | POA: Diagnosis not present

## 2019-12-14 DIAGNOSIS — E119 Type 2 diabetes mellitus without complications: Secondary | ICD-10-CM | POA: Diagnosis not present

## 2019-12-14 DIAGNOSIS — I10 Essential (primary) hypertension: Secondary | ICD-10-CM | POA: Diagnosis not present

## 2019-12-14 MED ORDER — POTASSIUM CHLORIDE CRYS ER 20 MEQ PO TBCR: 20.0000 meq | EXTENDED_RELEASE_TABLET | Freq: Two times a day (BID) | ORAL | 1 refills | Status: DC

## 2019-12-14 MED ORDER — FUROSEMIDE 20 MG PO TABS: 20.0000 mg | ORAL_TABLET | ORAL | 5 refills | Status: DC | PRN

## 2019-12-14 MED ORDER — AMLODIPINE BESYLATE 5 MG PO TABS: ORAL_TABLET | ORAL | 1 refills | Status: DC

## 2019-12-14 MED ORDER — LISINOPRIL 40 MG PO TABS: ORAL_TABLET | ORAL | 1 refills | Status: DC

## 2019-12-14 MED ORDER — PRAVASTATIN SODIUM 80 MG PO TABS: ORAL_TABLET | ORAL | 1 refills | Status: DC

## 2019-12-14 MED ORDER — HYDROCHLOROTHIAZIDE 25 MG PO TABS: 25.0000 mg | ORAL_TABLET | Freq: Every day | ORAL | 1 refills | Status: DC

## 2019-12-14 NOTE — Progress Notes (Signed)
Subjective:    Patient ID: Keith Hardin, male    DOB: 03-28-1951, 69 y.o.   MRN: [id number]  HPI   Patient is a 69 yr old male who presents today for follow up.  HTN- maintained on hctz, lisinopril, lasix, clonidine. Denies swelling.  BP Readings from Last 3 Encounters:  12/14/19 (!) 109/57  08/28/19 134/76  08/22/19 118/74   Hyperlipidemia- maintained on pravastatin 80mg  once daily. Lab Results  Component Value Date   CHOL 154 07/02/2019   HDL 42.70 07/02/2019   LDLCALC 88 07/02/2019   TRIG 119.0 07/02/2019   CHOLHDL 4 07/02/2019   DM2- maintained on metformin, amaryl, actos, sees Autumn Jones.   Anemia- maintained on oral iron supplement. Lab Results  Component Value Date   WBC 4.9 08/22/2019   HGB 13.5 08/22/2019   HCT 41.8 08/22/2019   MCV 84.3 08/22/2019   PLT 253 08/22/2019     Review of Systems    see HPI  Past Medical History:  Diagnosis Date  . Allergy   . Colon polyps   . Diabetes mellitus    type II  . GERD (gastroesophageal reflux disease)   . Glaucoma   . Heart murmur   . Hyperlipidemia   . Hypertension   . Hypogonadism male 05/02/2011  . Orchitis and epididymitis 01/2008   left with left hydrocele     Social History   Socioeconomic History  . Marital status: Married    Spouse name: [name]  . Number of children: 0  . Years of education: Not on file  . Highest education level: Not on file  Occupational History  . Occupation: Merchandiser, retail: UNEMPLOYED  Tobacco Use  . Smoking status: Never Smoker  . Smokeless tobacco: Never Used  Vaping Use  . Vaping Use: Never used  Substance and Sexual Activity  . Alcohol use: No  . Drug use: No  . Sexual activity: Not on file  Other Topics Concern  . 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  Difficulty of Paying Living Expenses: Not on file  Food Insecurity:   . Worried About Charity fundraiser in the Last Year: Not on file  . Ran Out of Food in the Last Year: Not on file  Transportation Needs:   . Lack of Transportation (Medical): Not on file  . Lack of Transportation (Non-Medical): Not on file  Physical Activity:   . Days of Exercise per Week: Not on file  . Minutes of Exercise per Session: Not on file  Stress:   . Feeling of Stress : Not on file  Social Connections:   . Frequency of Communication with Friends and Family: Not on file  . Frequency of Social Gatherings with Friends and Family: Not on file  . Attends Religious Services: Not on file  . Active Member of Clubs or Organizations: Not on file  . Attends Archivist Meetings: Not on file  . Marital Status: Not on file  Intimate Partner Violence:   . Fear of Current or Ex-Partner: Not on file  . Emotionally Abused: Not on file  . Physically Abused: Not on file  . Sexually Abused: Not on file    Past Surgical History:  Procedure Laterality Date  . ACHILLES  TENDON REPAIR     right    Family History  Problem Relation Age of Onset  . Seizures Mother   . Hypertension Mother   . Cancer Brother        stomach  . Hypothyroidism Sister   . Hypertension Brother   . Colon cancer Neg Hx   . Diabetes Neg Hx   . Heart attack Neg Hx   . Hyperlipidemia Neg Hx   . Sudden death Neg Hx     No Known Allergies  Current Outpatient Medications on File Prior to Visit  Medication Sig Dispense Refill  . amLODipine (NORVASC) 5 MG tablet TAKE 1 TABLET BY MOUTH EVERY DAY. **NEED OFFICE VISIT FOR REFILLS** 90 tablet 0  . aspirin 81 MG tablet Take 81 mg by mouth daily.      Marland Kitchen azelastine (ASTELIN) 0.1 % nasal spray INSTILL 2 SPRAYS INTO BOTH NOSTRILS AT BEDTIME AS NEEDED FOR RHINITIS AS DIRECTED 90 mL 1  . brimonidine (ALPHAGAN) 0.2 % ophthalmic solution INSTILL 1 DROP INTO AFFECTED EYE(S) BY OPHTHALMIC ROUTE  EVERY 8 HOURS  6  . cloNIDine (CATAPRES) 0.3 MG tablet TAKE 1 TABLET(0.3 MG) BY MOUTH TWICE DAILY 180 tablet 1  . dapagliflozin propanediol (FARXIGA) 10 MG TABS tablet Take 10 mg by mouth daily.    . Ferrous Sulfate (IRON) 325 (65 Fe) MG TABS Take 1 tablet by mouth daily. 30 each 0  . fexofenadine (ALLEGRA) 180 MG tablet Take 180 mg by mouth daily.    . fluticasone (FLONASE) 50 MCG/ACT nasal spray Place 2 sprays into both nostrils daily.    . furosemide (LASIX) 20 MG tablet TAKE 1 TABLET BY MOUTH ONCE DAILY FOR 3 DAYS, THEN ONCE DAILY AS NEEDED FOR SWELLING 30 tablet 0  . glimepiride (AMARYL) 4 MG tablet Take by mouth.    Marland Kitchen glucose blood (ACCU-CHEK AVIVA) test strip Use to test blood sugar three times daily. 50 each 1  . hydrochlorothiazide (HYDRODIURIL) 25 MG tablet TAKE 1 TABLET(25 MG) BY MOUTH DAILY 90 tablet 1  . Insulin Pen Needle (BD PEN NEEDLE NANO U/F) 32G X 4 MM MISC USE AS DIRECTED TO INJECT INSULIN 100 each 4  . ketorolac (ACULAR) 0.5 % ophthalmic solution INSTILL 1 DROP INTO AFFECTED EYE(S) 4 TIMES DAILY  1  . Lancets (ACCU-CHEK MULTICLIX) lancets Use as directed 3 times daily to check blood sugar 102 each 2  . lisinopril (ZESTRIL) 40 MG tablet TAKE 1 TABLET(40 MG) BY MOUTH DAILY 90 tablet 0  . metFORMIN (GLUCOPHAGE) 1000 MG tablet Take 1,000 mg by mouth 2 (two) times daily with a meal.    . Omega-3 Fatty Acids (FISH OIL) 1000 MG CAPS Take 1 capsule by mouth 2 (two) times daily.    Earney Navy Bicarbonate (ZEGERID OTC) 20-1100 MG CAPS capsule TAKE ONE CAPSULE BY MOUTH EVERY DAY BEFORE BREAKFAST 28 capsule 5  . OVER THE COUNTER MEDICATION NATURE'S FORMULA HEALING HERBS TEA for healthy living.  1/2 teaspoon daily in water.    Marland Kitchen OVER THE COUNTER MEDICATION NATURE'S FORMULA.  CLEANSING HERBS for healthy living.  1/2 teaspoon daily in water.    . pioglitazone (ACTOS) 30 MG tablet TAKE 1 TABLET(30 MG) BY MOUTH DAILY 90 tablet 0  . pravastatin (PRAVACHOL) 80 MG tablet TAKE 1  TABLET(80 MG) BY MOUTH DAILY 90 tablet 1  . Semaglutide (RYBELSUS) 3 MG TABS Take 3 mg by mouth daily. 3mg  daily for 30 days, then increase to 7mg     . TRESIBA FLEXTOUCH  100 UNIT/ML SOPN FlexTouch Pen   2  . potassium chloride SA (KLOR-CON) 20 MEQ tablet Take 1 tablet (20 mEq total) by mouth 2 (two) times daily for 5 days. 60 tablet 3   No current facility-administered medications on file prior to visit.    BP (!) 109/57 (BP Location: Right Arm, Patient Position: Sitting, Cuff Size: Large)   Pulse (!) 58   Temp 98.1 F (36.7 C) (Oral)   Resp 16   Ht 5\' 4"  (1.626 m)   Wt 224 lb (101.6 kg)   SpO2 99%   BMI 38.45 kg/m    Objective:   Physical Exam Constitutional:      General: He is not in acute distress.    Appearance: He is well-developed.  HENT:     Head: Normocephalic and atraumatic.  Cardiovascular:     Rate and Rhythm: Normal rate and regular rhythm.     Heart sounds: No murmur heard.   Pulmonary:     Effort: Pulmonary effort is normal. No respiratory distress.     Breath sounds: Normal breath sounds. No wheezing or rales.  Skin:    General: Skin is warm and dry.  Neurological:     Mental Status: He is alert and oriented to person, place, and time.  Psychiatric:        Behavior: Behavior normal.        Thought Content: Thought content normal.           Assessment & Plan:  DM2- clinically stable. Management per endocrinology.   Hyperlipidemia- LDL at goal. Continue statin.   Lab Results  Component Value Date   CHOL 154 07/02/2019   HDL 42.70 07/02/2019   LDLCALC 88 07/02/2019   TRIG 119.0 07/02/2019   CHOLHDL 4 07/02/2019   Anemia- check iron studies.  Continue oral iron. Lab Results  Component Value Date   WBC 4.9 08/22/2019   HGB 13.5 08/22/2019   HCT 41.8 08/22/2019   MCV 84.3 08/22/2019   PLT 253 08/22/2019   HTN- bp stable on current regimen. Continue same.   This visit occurred during the SARS-CoV-2 public health emergency.  Safety  protocols were in place, including screening questions prior to the visit, additional usage of staff PPE, and extensive cleaning of exam room while observing appropriate contact time as indicated for disinfecting solutions.

## 2019-12-21 ENCOUNTER — Other Ambulatory Visit: Payer: Self-pay

## 2019-12-21 ENCOUNTER — Other Ambulatory Visit: Payer: BC Managed Care – PPO

## 2019-12-21 DIAGNOSIS — E611 Iron deficiency: Secondary | ICD-10-CM

## 2019-12-21 LAB — HEMOGLOBIN A1C: Hemoglobin A1C: 704

## 2019-12-21 NOTE — Addendum Note (Signed)
Addended by: Angelina Pih on: 12/21/2019 09:36 AM   Modules accepted: Orders

## 2019-12-22 ENCOUNTER — Encounter: Payer: Self-pay | Admitting: Family

## 2019-12-22 LAB — BASIC METABOLIC PANEL
BUN: 21 mg/dL (ref 7–25)
CO2: 30 mmol/L (ref 20–32)
Calcium: 9.4 mg/dL (ref 8.6–10.3)
Chloride: 109 mmol/L (ref 98–110)
Creat: 1.13 mg/dL (ref 0.70–1.25)
Glucose, Bld: 122 mg/dL — ABNORMAL HIGH (ref 65–99)
Potassium: 4.1 mmol/L (ref 3.5–5.3)
Sodium: 146 mmol/L (ref 135–146)

## 2019-12-22 LAB — IRON: Iron: 67 ug/dL (ref 50–180)

## 2019-12-22 LAB — FERRITIN: Ferritin: 59 ng/mL (ref 24–380)

## 2019-12-26 NOTE — Progress Notes (Signed)
Printed for patient

## 2020-01-02 ENCOUNTER — Other Ambulatory Visit: Payer: Self-pay | Admitting: Family

## 2020-01-29 ENCOUNTER — Other Ambulatory Visit: Payer: Self-pay | Admitting: Family

## 2020-01-29 DIAGNOSIS — I10 Essential (primary) hypertension: Secondary | ICD-10-CM

## 2020-01-31 ENCOUNTER — Telehealth: Payer: Self-pay

## 2020-01-31 NOTE — Telephone Encounter (Signed)
Received referral from [facility] [phone] Referral sent to scheduling Records on File

## 2020-03-15 ENCOUNTER — Other Ambulatory Visit: Payer: Self-pay | Admitting: Family Medicine

## 2020-03-27 ENCOUNTER — Other Ambulatory Visit: Payer: Self-pay | Admitting: Family

## 2020-03-27 DIAGNOSIS — I10 Essential (primary) hypertension: Secondary | ICD-10-CM

## 2020-03-31 ENCOUNTER — Ambulatory Visit: Payer: BC Managed Care – PPO | Attending: Internal Medicine

## 2020-03-31 ENCOUNTER — Other Ambulatory Visit (HOSPITAL_BASED_OUTPATIENT_CLINIC_OR_DEPARTMENT_OTHER): Payer: Self-pay | Admitting: Internal Medicine

## 2020-03-31 DIAGNOSIS — Z23 Encounter for immunization: Secondary | ICD-10-CM

## 2020-03-31 NOTE — Progress Notes (Signed)
   Covid-19 Vaccination Clinic  Name:  Keith Hardin    MRN: [id number] DOB: 1951-03-31  03/31/2020  Mr. Keith Hardin was observed post Covid-19 immunization for 15 minutes without incident. He was provided with Vaccine Information Sheet and instruction to access the V-Safe system.   Mr. Keith Hardin was instructed to call 911 with any severe reactions post vaccine: Marland Kitchen Difficulty breathing  . Swelling of face and throat  . A fast heartbeat  . A bad rash all over body  . Dizziness and weakness   Immunizations Administered    Name Date Dose VIS Date Route   Pfizer COVID-19 Vaccine 03/31/2020 10:11 AM 0.3 mL 02/13/2020 Intramuscular   Manufacturer: Esmeralda   Lot: [id number]   [place]: [id number]

## 2020-04-08 MED FILL — PFIZER-BIONTECH COVID-19 VA: 30 | 1 days supply | Qty: 0 | Fill #0

## 2020-04-15 ENCOUNTER — Ambulatory Visit: Payer: BC Managed Care – PPO | Admitting: Family

## 2020-04-15 ENCOUNTER — Telehealth: Payer: Self-pay | Admitting: Family

## 2020-04-15 ENCOUNTER — Other Ambulatory Visit: Payer: Self-pay

## 2020-04-15 ENCOUNTER — Encounter: Payer: Self-pay | Admitting: Family

## 2020-04-15 VITALS — BP 130/70 | HR 73 | Temp 97.9°F | Resp 16 | Ht 64.0 in | Wt 223.0 lb

## 2020-04-15 DIAGNOSIS — Z23 Encounter for immunization: Secondary | ICD-10-CM | POA: Diagnosis not present

## 2020-04-15 DIAGNOSIS — I10 Essential (primary) hypertension: Secondary | ICD-10-CM

## 2020-04-15 DIAGNOSIS — E119 Type 2 diabetes mellitus without complications: Secondary | ICD-10-CM

## 2020-04-15 DIAGNOSIS — D509 Iron deficiency anemia, unspecified: Secondary | ICD-10-CM

## 2020-04-15 NOTE — Telephone Encounter (Signed)
Can you please abstract his A1C 7.4% from 12/21/19 in care everywhere?

## 2020-04-15 NOTE — Telephone Encounter (Signed)
Results abstracted

## 2020-04-15 NOTE — Progress Notes (Signed)
Subjective:    Patient ID: PHU RECORD, male    DOB: January 06, 1951, 69 y.o.   MRN: [id number]  HPI  Patient is a 69 yr old male who presents today for follow up.  HTN- maintained on amlodipine 5mg , lisinopril 40mg , clonidine 0.3mg ,  BP Readings from Last 3 Encounters:  04/15/20 130/70  12/14/19 (!) 109/57  08/28/19 134/76   Hyperlipidemia- maintained on pravastatin.  Lab Results  Component Value Date   CHOL 154 07/02/2019   HDL 42.70 07/02/2019   LDLCALC 88 07/02/2019   TRIG 119.0 07/02/2019   CHOLHDL 4 07/02/2019   DM2- continues to follow with endocrinology.  Lab Results  Component Value Date   HGBA1C 7.0 06/12/2019   HGBA1C 7.6 (H) 07/30/2015   HGBA1C 6.6 (H) 01/23/2015   Lab Results  Component Value Date   MICROALBUR <0.7 07/30/2015   LDLCALC 88 07/02/2019   CREATININE 1.13 12/21/2019   Iron def anemia- continues iron supplement.  Lab Results  Component Value Date   WBC 4.9 08/22/2019   HGB 13.5 08/22/2019   HCT 41.8 08/22/2019   MCV 84.3 08/22/2019   PLT 253 08/22/2019     Review of Systems    see HPI  Past Medical History:  Diagnosis Date  . Allergy   . Colon polyps   . Diabetes mellitus    type II  . GERD (gastroesophageal reflux disease)   . Glaucoma   . Heart murmur   . Hyperlipidemia   . Hypertension   . Hypogonadism male 05/02/2011  . Orchitis and epididymitis 01/2008   left with left hydrocele     Social History   Socioeconomic History  . Marital status: Married    Spouse name: [name]  . Number of children: 0  . Years of education: Not on file  . Highest education level: Not on file  Occupational History  . Occupation: Merchandiser, retail: UNEMPLOYED  Tobacco Use  . Smoking status: Never Smoker  . Smokeless tobacco: Never Used  Vaping Use  . Vaping Use: Never used  Substance and Sexual Activity  . Alcohol use: No  . Drug use: No  . Sexual activity: Not on file  Other Topics Concern  . 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 ACHILLES TENDON REPAIR     right    Family History  Problem Relation Age of Onset  . Seizures Mother   . Hypertension Mother   . Cancer Brother        stomach  . Hypothyroidism Sister   . Hypertension Brother   . Colon cancer Neg Hx   . Diabetes Neg Hx   . Heart attack Neg Hx   . Hyperlipidemia Neg Hx   . Sudden death Neg Hx     No Known Allergies  Current Outpatient Medications on File Prior to Visit  Medication Sig Dispense Refill  . amLODipine (NORVASC) 5 MG tablet 1 tablet by mouth once daily 90 tablet 1  .  aspirin 81 MG tablet Take 81 mg by mouth daily.      Marland Kitchen azelastine (ASTELIN) 0.1 % nasal spray INSTILL 2 SPRAYS INTO BOTH NOSTRILS AT BEDTIME AS NEEDED FOR RHINITIS AS DIRECTED 90 mL 1  . BD PEN NEEDLE NANO 2ND GEN 32G X 4 MM MISC USE AS DIRECTED TO INJECT INSULIN 100 each 4  . brimonidine (ALPHAGAN) 0.2 % ophthalmic solution INSTILL 1 DROP INTO AFFECTED EYE(S) BY OPHTHALMIC ROUTE EVERY 8 HOURS  6  . cloNIDine (CATAPRES) 0.3 MG tablet TAKE 1 TABLET(0.3 MG) BY MOUTH TWICE DAILY 180 tablet 1  . dapagliflozin propanediol (FARXIGA) 10 MG TABS tablet Take 10 mg by mouth daily.    . dorzolamide-timolol (COSOPT) 22.3-6.8 MG/ML ophthalmic solution 1 drop 2 (two) times daily.    . Ferrous Sulfate (IRON) 325 (65 Fe) MG TABS Take 1 tablet by mouth daily. 30 each 0  . fexofenadine (ALLEGRA) 180 MG tablet Take 180 mg by mouth daily.    . fluticasone (FLONASE) 50 MCG/ACT nasal spray Place 2 sprays into both nostrils daily.    .  furosemide (LASIX) 20 MG tablet TAKE 1 TABLET (20 MG TOTAL) BY MOUTH AS NEEDED. FOR SWELLING 90 tablet 1  . glimepiride (AMARYL) 4 MG tablet Take by mouth.    Marland Kitchen glucose blood (ACCU-CHEK AVIVA) test strip Use to test blood sugar three times daily. 50 each 1  . hydrochlorothiazide (HYDRODIURIL) 25 MG tablet TAKE 1 TABLET BY MOUTH EVERY DAY 90 tablet 1  . ketorolac (ACULAR) 0.5 % ophthalmic solution INSTILL 1 DROP INTO AFFECTED EYE(S) 4 TIMES DAILY  1  . Lancets (ACCU-CHEK MULTICLIX) lancets Use as directed 3 times daily to check blood sugar 102 each 2  . lisinopril (ZESTRIL) 40 MG tablet TAKE 1 TABLET BY MOUTH EVERY DAY 90 tablet 1  . metFORMIN (GLUCOPHAGE) 1000 MG tablet Take 1,000 mg by mouth 2 (two) times daily with a meal.    . Omega-3 Fatty Acids (FISH OIL) 1000 MG CAPS Take 1 capsule by mouth 2 (two) times daily.    Maxwell Caul Bicarbonate (ZEGERID OTC) 20-1100 MG CAPS capsule TAKE ONE CAPSULE BY MOUTH EVERY DAY BEFORE BREAKFAST 28 capsule 5  . OVER THE COUNTER MEDICATION NATURE'S FORMULA HEALING HERBS TEA for healthy living.  1/2 teaspoon daily in water.    Marland Kitchen OVER THE COUNTER MEDICATION NATURE'S FORMULA.  CLEANSING HERBS for healthy living.  1/2 teaspoon daily in water.    . pioglitazone (ACTOS) 30 MG tablet TAKE 1 TABLET(30 MG) BY MOUTH DAILY 90 tablet 0  . potassium chloride SA (KLOR-CON) 20 MEQ tablet Take 1 tablet (20 mEq total) by mouth 2 (two) times daily. 270 tablet 1  . pravastatin (PRAVACHOL) 80 MG tablet TAKE 1 TABLET(80 MG) BY MOUTH DAILY 90 tablet 1  . Semaglutide (RYBELSUS) 3 MG TABS Take 3 mg by mouth daily. 3mg  daily for 30 days, then increase to 7mg     . Semaglutide (RYBELSUS) 7 MG TABS Take 1 tablet by mouth daily.    . TRESIBA FLEXTOUCH 100 UNIT/ML SOPN FlexTouch Pen Inject 40 Units into the skin daily.  2   No current facility-administered medications on file prior to visit.    BP 130/70 (BP Location: Right Arm, Patient Position: Sitting, Cuff Size: Large)    Pulse 73   Temp 97.9 F (36.6 C) (Oral)   Resp 16   Ht 5\' 4"  (1.626 m)   Wt 223 lb (101.2 kg)   SpO2 100%   BMI 38.28 kg/m  Objective:   Physical Exam Constitutional:      General: He is not in acute distress.    Appearance: He is well-developed and well-nourished.  HENT:     Head: Normocephalic and atraumatic.  Cardiovascular:     Rate and Rhythm: Normal rate and regular rhythm.     Heart sounds: No murmur heard.   Pulmonary:     Effort: Pulmonary effort is normal. No respiratory distress.     Breath sounds: Normal breath sounds. No wheezing or rales.  Musculoskeletal:        General: No edema.  Skin:    General: Skin is warm and dry.  Neurological:     Mental Status: He is alert and oriented to person, place, and time.  Psychiatric:        Mood and Affect: Mood and affect normal.        Behavior: Behavior normal.        Thought Content: Thought content normal.           Assessment & Plan:  HTN- bp is stable. Continue amlodipine 5mg , lisinopril 40mg , clonidine 0.3mg ,   Iron deficiency anemia-serum iron/ferritin normal last visit. Check CBC, continue iron 325mg  once daily.  Hyperlipidemia- LDL at goal, continue pravachol 80mg .  DM2- a1c 7.4 back in August- management per endo.  This visit occurred during the SARS-CoV-2 public health emergency.  Safety protocols were in place, including screening questions prior to the visit, additional usage of staff PPE, and extensive cleaning of exam room while observing appropriate contact time as indicated for disinfecting solutions.

## 2020-04-15 NOTE — Patient Instructions (Signed)
Please complete lab work prior to leaving.   

## 2020-04-16 ENCOUNTER — Other Ambulatory Visit (INDEPENDENT_AMBULATORY_CARE_PROVIDER_SITE_OTHER): Payer: BC Managed Care – PPO

## 2020-04-16 ENCOUNTER — Encounter: Payer: Self-pay | Admitting: Family

## 2020-04-16 DIAGNOSIS — D509 Iron deficiency anemia, unspecified: Secondary | ICD-10-CM

## 2020-04-16 DIAGNOSIS — I10 Essential (primary) hypertension: Secondary | ICD-10-CM

## 2020-04-16 LAB — CBC WITH DIFFERENTIAL/PLATELET
Basophils Absolute: 0 10*3/uL (ref 0.0–0.1)
Basophils Relative: 0.2 % (ref 0.0–3.0)
Eosinophils Absolute: 0.2 10*3/uL (ref 0.0–0.7)
Eosinophils Relative: 3.3 % (ref 0.0–5.0)
HCT: 42.8 % (ref 39.0–52.0)
Hemoglobin: 14.2 g/dL (ref 13.0–17.0)
Lymphocytes Relative: 32.2 % (ref 12.0–46.0)
Lymphs Abs: 2 10*3/uL (ref 0.7–4.0)
MCHC: 33.2 g/dL (ref 30.0–36.0)
MCV: 83.5 fl (ref 78.0–100.0)
Monocytes Absolute: 0.5 10*3/uL (ref 0.1–1.0)
Monocytes Relative: 7.8 % (ref 3.0–12.0)
Neutro Abs: 3.5 10*3/uL (ref 1.4–7.7)
Neutrophils Relative %: 56.5 % (ref 43.0–77.0)
Platelets: 243 10*3/uL (ref 150.0–400.0)
RBC: 5.13 Mil/uL (ref 4.22–5.81)
RDW: 15.3 % (ref 11.5–15.5)
WBC: 6.2 10*3/uL (ref 4.0–10.5)

## 2020-04-16 LAB — BASIC METABOLIC PANEL
BUN: 24 mg/dL — ABNORMAL HIGH (ref 6–23)
CO2: 31 mEq/L (ref 19–32)
Calcium: 9.4 mg/dL (ref 8.4–10.5)
Chloride: 105 mEq/L (ref 96–112)
Creatinine, Ser: 1.05 mg/dL (ref 0.40–1.50)
GFR: 72.57 mL/min (ref >60.00)
Glucose, Bld: 101 mg/dL — ABNORMAL HIGH (ref 70–99)
Potassium: 3.9 mEq/L (ref 3.5–5.1)
Sodium: 144 mEq/L (ref 135–145)

## 2020-04-17 NOTE — Progress Notes (Signed)
Mailed out to pt 

## 2020-05-02 ENCOUNTER — Telehealth (INDEPENDENT_AMBULATORY_CARE_PROVIDER_SITE_OTHER): Payer: BC Managed Care – PPO | Admitting: Family

## 2020-05-02 ENCOUNTER — Other Ambulatory Visit: Payer: Self-pay

## 2020-05-02 DIAGNOSIS — U071 COVID-19: Secondary | ICD-10-CM

## 2020-05-02 MED ORDER — BENZONATATE 100 MG PO CAPS
100.0000 mg | ORAL_CAPSULE | Freq: Three times a day (TID) | ORAL | 0 refills | Status: DC | PRN
Start: 2020-05-02 — End: 2020-09-19

## 2020-05-02 NOTE — Progress Notes (Signed)
Virtual Visit via Video Note  I connected with Keith Hardin on 05/02/20 at 10:00 AM EST by a video enabled telemedicine application and verified that I am speaking with the correct person using two identifiers.  Location: Patient: home Provider: work   I discussed the limitations of evaluation and management by telemedicine and the availability of in person appointments. The patient expressed understanding and agreed to proceed. Only the patient and myself were present for today's video call.   History of Present Illness:  Symptoms began with cough, congestion on Tuesday 1/4. Had exposure at church. He has not checked his temperature but did wake up sweating one night.  He denies loss of taste or smell. Wife is symptomatic but does not have her results back. + fatigue.  Denies SOB. HA and sore throat yesterday- both better today.  Observations/Objective:   Gen: Awake, alert, no acute distress Resp: Breathing is even and non-labored Psych: calm/pleasant demeanor Neuro: Alert and Oriented x 3, + facial symmetry, speech is clear.   Assessment and Plan:  COVID-19 infection-new.  He is fully vaccinated with the Pfizer vaccine series x3.  Thankfully, at this time he appears to have mild disease.  I have sent a prescription for Tessalon 100 mg tabs to his pharmacy to use as needed.  He is advised to use Tylenol as needed for pain or fever.  He is also advised to call if symptoms do not improve.  He is advised that he should remain in quarantine through Sunday January 9 and left his quarantine only if his symptoms are improved at that time.  He has been to continue to mask in public.  He is also advised to go to the emergency room should he develop severe weakness or shortness of breath.  Patient verbalizes understanding.  I will place a referral to our monoclonal antibody team to see if he qualifies for the infusion or any of the new oral antivirals.  All of these treatments are extremely  scarce however, and I advised the patient that he may not qualify to receive any of these treatments under our current triage protocols.   Follow Up Instructions:    I discussed the assessment and treatment plan with the patient. The patient was provided an opportunity to ask questions and all were answered. The patient agreed with the plan and demonstrated an understanding of the instructions.   The patient was advised to call back or seek an in-person evaluation if the symptoms worsen or if the condition fails to improve as anticipated.  Nance Pear, NP

## 2020-05-04 ENCOUNTER — Telehealth: Payer: Self-pay | Admitting: Physician Assistant

## 2020-05-04 NOTE — Telephone Encounter (Signed)
  Pt is qualified for the monoclonal antibody infusion, but due to medication shortages we cannot offer the pt the infusion at this time. This decision was based on clinical judgement as well as the the NIH Covid 19 treatment guidelines for treatment prioritization and equitable access. Symptoms tier reviewed as well as criteria for ending isolation. Preventative practices reviewed. Patient verbalized understanding.  Sx started 1/4, he is vaccinated with a booster. He is beyond day 5 of symptoms for paxlovid. He does not qualify for MAB with vaccination status.  Tami Lin Tametra Ahart, [place]  05/04/2020 8:32 AM

## 2020-05-05 ENCOUNTER — Other Ambulatory Visit: Payer: BC Managed Care – PPO

## 2020-06-11 LAB — HM DIABETES EYE EXAM

## 2020-07-18 ENCOUNTER — Other Ambulatory Visit: Payer: Self-pay | Admitting: Family

## 2020-07-24 LAB — HEMOGLOBIN A1C: Hemoglobin A1C: 6.9

## 2020-09-18 ENCOUNTER — Other Ambulatory Visit: Payer: Self-pay | Admitting: Family

## 2020-09-19 ENCOUNTER — Ambulatory Visit: Payer: BC Managed Care – PPO | Admitting: Family

## 2020-09-19 ENCOUNTER — Other Ambulatory Visit: Payer: Self-pay

## 2020-09-19 ENCOUNTER — Encounter: Payer: Self-pay | Admitting: Family

## 2020-09-19 VITALS — BP 107/45 | HR 58 | Temp 98.6°F | Resp 16 | Ht 64.0 in | Wt 223.0 lb

## 2020-09-19 DIAGNOSIS — R319 Hematuria, unspecified: Secondary | ICD-10-CM | POA: Diagnosis not present

## 2020-09-19 DIAGNOSIS — N481 Balanitis: Secondary | ICD-10-CM | POA: Insufficient documentation

## 2020-09-19 DIAGNOSIS — E785 Hyperlipidemia, unspecified: Secondary | ICD-10-CM | POA: Diagnosis not present

## 2020-09-19 DIAGNOSIS — Z23 Encounter for immunization: Secondary | ICD-10-CM

## 2020-09-19 DIAGNOSIS — I10 Essential (primary) hypertension: Secondary | ICD-10-CM

## 2020-09-19 DIAGNOSIS — K219 Gastro-esophageal reflux disease without esophagitis: Secondary | ICD-10-CM

## 2020-09-19 DIAGNOSIS — D509 Iron deficiency anemia, unspecified: Secondary | ICD-10-CM | POA: Diagnosis not present

## 2020-09-19 DIAGNOSIS — E291 Testicular hypofunction: Secondary | ICD-10-CM

## 2020-09-19 DIAGNOSIS — E119 Type 2 diabetes mellitus without complications: Secondary | ICD-10-CM

## 2020-09-19 DIAGNOSIS — N200 Calculus of kidney: Secondary | ICD-10-CM

## 2020-09-19 LAB — COMPREHENSIVE METABOLIC PANEL
ALT: 18 U/L (ref 0–53)
AST: 15 U/L (ref 0–37)
Albumin: 4 g/dL (ref 3.5–5.2)
Alkaline Phosphatase: 59 U/L (ref 39–117)
BUN: 24 mg/dL — ABNORMAL HIGH (ref 6–23)
CO2: 30 mEq/L (ref 19–32)
Calcium: 9.1 mg/dL (ref 8.4–10.5)
Chloride: 103 mEq/L (ref 96–112)
Creatinine, Ser: 0.97 mg/dL (ref 0.40–1.50)
GFR: 79.57 mL/min (ref >60.00)
Glucose, Bld: 127 mg/dL — ABNORMAL HIGH (ref 70–99)
Potassium: 4 mEq/L (ref 3.5–5.1)
Sodium: 143 mEq/L (ref 135–145)
Total Bilirubin: 0.5 mg/dL (ref 0.2–1.2)
Total Protein: 6 g/dL (ref 6.0–8.3)

## 2020-09-19 LAB — URINALYSIS, ROUTINE W REFLEX MICROSCOPIC
Bilirubin Urine: NEGATIVE
Hgb urine dipstick: NEGATIVE
Ketones, ur: NEGATIVE
Leukocytes,Ua: NEGATIVE
Nitrite: POSITIVE — AB
Specific Gravity, Urine: 1.01 (ref 1.000–1.030)
Total Protein, Urine: NEGATIVE
Urine Glucose: >=1000 — AB
Urobilinogen, UA: 0.2 (ref 0.0–1.0)
pH: 6 (ref 5.0–8.0)

## 2020-09-19 LAB — CBC WITH DIFFERENTIAL/PLATELET
Basophils Absolute: 0 10*3/uL (ref 0.0–0.1)
Basophils Relative: 0.8 % (ref 0.0–3.0)
Eosinophils Absolute: 0.2 10*3/uL (ref 0.0–0.7)
Eosinophils Relative: 4.2 % (ref 0.0–5.0)
HCT: 38.3 % — ABNORMAL LOW (ref 39.0–52.0)
Hemoglobin: 13 g/dL (ref 13.0–17.0)
Lymphocytes Relative: 33.8 % (ref 12.0–46.0)
Lymphs Abs: 1.5 10*3/uL (ref 0.7–4.0)
MCHC: 34 g/dL (ref 30.0–36.0)
MCV: 82.5 fl (ref 78.0–100.0)
Monocytes Absolute: 0.4 10*3/uL (ref 0.1–1.0)
Monocytes Relative: 9.4 % (ref 3.0–12.0)
Neutro Abs: 2.3 10*3/uL (ref 1.4–7.7)
Neutrophils Relative %: 51.8 % (ref 43.0–77.0)
Platelets: 219 10*3/uL (ref 150.0–400.0)
RBC: 4.64 Mil/uL (ref 4.22–5.81)
RDW: 15.7 % — ABNORMAL HIGH (ref 11.5–15.5)
WBC: 4.4 10*3/uL (ref 4.0–10.5)

## 2020-09-19 LAB — LIPID PANEL
Cholesterol: 156 mg/dL (ref 0–200)
HDL: 43 mg/dL (ref >39.00)
LDL Cholesterol: 78 mg/dL (ref 0–99)
NonHDL: 113.28
Total CHOL/HDL Ratio: 4
Triglycerides: 176 mg/dL — ABNORMAL HIGH (ref 0.0–149.0)
VLDL: 35.2 mg/dL (ref 0.0–40.0)

## 2020-09-19 LAB — FERRITIN: Ferritin: 47.1 ng/mL (ref 22.0–322.0)

## 2020-09-19 LAB — IRON: Iron: 64 ug/dL (ref 42–165)

## 2020-09-19 MED ORDER — LISINOPRIL 40 MG PO TABS: ORAL_TABLET | ORAL | 1 refills | Status: DC

## 2020-09-19 MED ORDER — PRAVASTATIN SODIUM 80 MG PO TABS: ORAL_TABLET | ORAL | 1 refills | Status: DC

## 2020-09-19 MED ORDER — AMLODIPINE BESYLATE 5 MG PO TABS: ORAL_TABLET | ORAL | 1 refills | Status: DC

## 2020-09-19 MED ORDER — HYDROCHLOROTHIAZIDE 25 MG PO TABS: 1.0000 | ORAL_TABLET | Freq: Every day | ORAL | 1 refills | Status: DC

## 2020-09-19 MED ORDER — CLONIDINE HCL 0.3 MG PO TABS: ORAL_TABLET | ORAL | 1 refills | Status: DC

## 2020-09-19 MED ORDER — MICONAZOLE NITRATE 2 % EX CREA: 1.0000 "application " | TOPICAL_CREAM | Freq: Two times a day (BID) | CUTANEOUS | 0 refills | Status: DC | PRN

## 2020-09-19 NOTE — Assessment & Plan Note (Signed)
Stable with OTC zegerid. Continue same.

## 2020-09-19 NOTE — Addendum Note (Signed)
Addended by: Jiles Prows on: 09/19/2020 10:11 AM   Modules accepted: Orders

## 2020-09-19 NOTE — Assessment & Plan Note (Signed)
Requesting refill of miconazole cream.

## 2020-09-19 NOTE — Assessment & Plan Note (Signed)
Stable, management per endo.

## 2020-09-19 NOTE — Assessment & Plan Note (Signed)
Hx consistent with a passed kidney stone. Check UA to rule out persistent microcytic hematuria.

## 2020-09-19 NOTE — Progress Notes (Signed)
Subjective:   By signing my name below, I, Shehryar Baig, attest that this documentation has been prepared under the direction and in the presence of Debbrah Alar NP. 09/19/2020      Patient ID: Keith Hardin, male    DOB: 1951-02-28, 70 y.o.   MRN: [id number]  No chief complaint on file.   HPI Patient is in today for a office visit.   HTN- maintained on hctz 83m and lisinopril 495m  BP Readings from Last 3 Encounters:  04/15/20 130/70  12/14/19 (!) 109/57  08/28/19 134/76   [facility]  Hyperlipidemia- maintained on pravastatin 80 mg.  Lab Results  Component Value Date   CHOL 154 07/02/2019   HDL 42.70 07/02/2019   LDLCALC 88 07/02/2019   TRIG 119.0 07/02/2019   CHOLHDL 4 07/02/2019    Kidney Stone- He mentions last Saturday he felt flank pain and thinks he passed a kidney stone. He did not go to the ED after passing it. Weight- He has no changes to his weight since his last visit. Wt Readings from Last 3 Encounters:  09/19/20 223 lb (101.2 kg)  04/15/20 223 lb (101.2 kg)  12/14/19 224 lb (101.6 kg)   DiabetesHe has met with the diabetes doctor at the end of march to manage his diabetes symptoms. His blood sugar has had episodes where it dropped to 55 recently. Exercise- He does not participate in exercise at this time. Blood pressure- His blood pressure is normal at this time. He measured 107/45 today. He uses 20 mg lasix PO as need for his swelling and has used it at least once everyday. He notes that on days where he forgets to take them he has no swelling. He continues to take 0.3 clonidine 2x daily PO and is requesting a refill. He continues to take 25 mg hydrochlorothiazide daily PO to manage his hypertension and is requesting a refill. He continues to take 40 mg Zestril daily PO to manage his hypertension and is requesting a refill. He continues to take 5 mg amlodipine daily PO to manage his  hypertension and request a refill. Immunizations- He is due for a pneumonia vaccine.  Iron- He continues to take iron supplements at this time.    Past Medical History:  Diagnosis Date  . Allergy   . Colon polyps   . Diabetes mellitus    type II  . GERD (gastroesophageal reflux disease)   . Glaucoma   . Heart murmur   . Hyperlipidemia   . Hypertension   . Hypogonadism male 05/02/2011  . Orchitis and epididymitis 01/2008   left with left hydrocele    Past Surgical History:  Procedure Laterality Date  . ACHILLES TENDON REPAIR     right    Family History  Problem Relation Age of Onset  . Seizures Mother   . Hypertension Mother   . Cancer Brother        stomach  . Hypothyroidism Sister   . Hypertension Brother   . Colon cancer Neg Hx   . Diabetes Neg Hx   . Heart attack Neg Hx   . Hyperlipidemia Neg Hx   . Sudden death Neg Hx     Social History   Socioeconomic History  . Marital status: Married    Spouse name: ErDoroteo Bradford. Number of children: 0  . Years of education: Not on file  . Highest education level: Not on file  Occupational  History  . Occupation: Merchandiser, retail: UNEMPLOYED  Tobacco Use  . Smoking status: Never Smoker  . Smokeless tobacco: Never Used  Vaping Use  . Vaping Use: Never used  Substance and Sexual Activity  . Alcohol use: No  . Drug use: No  . Sexual activity: Not on file  Other Topics Concern  . 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 amLODipine (NORVASC) 5 MG tablet  TAKE 1 TABLET BY MOUTH EVERY DAY 90 tablet 0  . aspirin 81 MG tablet Take 81 mg by mouth daily.    Marland Kitchen azelastine (ASTELIN) 0.1 % nasal spray INSTILL 2 SPRAYS INTO BOTH NOSTRILS AT BEDTIME AS NEEDED FOR RHINITIS AS DIRECTED 90 mL 1  . BD PEN NEEDLE NANO 2ND GEN 32G X 4 MM MISC USE AS DIRECTED TO INJECT INSULIN 100 each 4  . benzonatate (TESSALON) 100 MG capsule Take 1 capsule (100 mg total) by mouth 3 (three) times daily as needed. 20 capsule 0  . brimonidine (ALPHAGAN) 0.2 % ophthalmic solution INSTILL 1 DROP INTO AFFECTED EYE(S) BY OPHTHALMIC ROUTE EVERY 8 HOURS  6  . cloNIDine (CATAPRES) 0.3 MG tablet TAKE 1 TABLET(0.3 MG) BY MOUTH TWICE DAILY 180 tablet 1  . COVID-19 mRNA vaccine, Pfizer, 30 MCG/0.3ML injection INJECT AS DIRECTED .3 mL 0  . dapagliflozin propanediol (FARXIGA) 10 MG TABS tablet Take 10 mg by mouth daily.    . dorzolamide-timolol (COSOPT) 22.3-6.8 MG/ML ophthalmic solution 1 drop 2 (two) times daily.    . Ferrous Sulfate (IRON) 325 (65 Fe) MG TABS Take 1 tablet by mouth daily. 30 each 0  . fexofenadine (ALLEGRA) 180 MG tablet Take 180 mg by mouth daily.    . fluticasone (FLONASE) 50 MCG/ACT nasal spray Place 2 sprays into both nostrils daily.    . furosemide (LASIX) 20 MG tablet TAKE 1 TABLET (20 MG TOTAL) BY MOUTH AS NEEDED. FOR SWELLING 90 tablet 1  . glimepiride (AMARYL) 4 MG tablet Take by mouth.    Marland Kitchen glucose blood (ACCU-CHEK AVIVA) test strip Use to test blood sugar three times daily. 50 each 1  . hydrochlorothiazide (HYDRODIURIL) 25 MG tablet TAKE 1 TABLET BY MOUTH EVERY DAY 90 tablet 1  . ketorolac (ACULAR) 0.5 % ophthalmic solution INSTILL 1 DROP INTO AFFECTED EYE(S) 4 TIMES DAILY  1  . KLOR-CON M20 20 MEQ tablet TAKE 1 TABLET BY MOUTH TWICE A DAY 180 tablet 2  . Lancets (ACCU-CHEK MULTICLIX) lancets Use as directed 3 times daily to check blood sugar 102 each 2  . lisinopril (ZESTRIL) 40 MG tablet TAKE 1 TABLET BY MOUTH EVERY DAY 90 tablet 1  . metFORMIN (GLUCOPHAGE)  1000 MG tablet Take 1,000 mg by mouth 2 (two) times daily with a meal.    . Omega-3 Fatty Acids (FISH OIL) 1000 MG CAPS Take 1 capsule by mouth 2 (two) times daily.    Earney Navy Bicarbonate (ZEGERID OTC) 20-1100 MG CAPS capsule TAKE ONE CAPSULE BY MOUTH  EVERY DAY BEFORE BREAKFAST 28 capsule 5  . OVER THE COUNTER MEDICATION NATURE'S FORMULA HEALING HERBS TEA for healthy living.  1/2 teaspoon daily in water.    Marland Kitchen OVER THE COUNTER MEDICATION NATURE'S FORMULA.  CLEANSING HERBS for healthy living.  1/2 teaspoon daily in water.    . pioglitazone (ACTOS) 30 MG tablet TAKE 1 TABLET(30 MG) BY MOUTH DAILY 90 tablet 0  . pravastatin (PRAVACHOL) 80 MG tablet TAKE 1 TABLET(80 MG) BY MOUTH ONCE DAILY 90 tablet 0  . Semaglutide (RYBELSUS) 3 MG TABS Take 3 mg by mouth daily. 58m daily for 30 days, then increase to 727m   . Semaglutide (RYBELSUS) 7 MG TABS Take 1 tablet by mouth daily.    . TRESIBA FLEXTOUCH 100 UNIT/ML SOPN FlexTouch Pen Inject 40 Units into the skin daily.  2   No facility-administered medications prior to visit.    No Known Allergies  ROS     Objective:    Physical Exam Constitutional:      General: He is not in acute distress.    Appearance: Normal appearance. He is not ill-appearing.  HENT:     Head: Normocephalic and atraumatic.     Right Ear: External ear normal.     Left Ear: External ear normal.  Eyes:     Extraocular Movements: Extraocular movements intact.     Pupils: Pupils are equal, round, and reactive to light.  Cardiovascular:     Rate and Rhythm: Normal rate and regular rhythm.     Pulses: Normal pulses.          Dorsalis pedis pulses are 2+ on the right side and 2+ on the left side.       Posterior tibial pulses are 2+ on the right side and 2+ on the left side.     Heart sounds: Normal heart sounds. No murmur heard. No gallop.   Pulmonary:     Effort: Pulmonary effort is normal. No respiratory distress.     Breath sounds: Normal breath sounds. No  wheezing, rhonchi or rales.  Feet:     Comments: Great toenails are surgically absent. Remaining toenails are hypertrophic. Skin:    General: Skin is warm and dry.  Neurological:     Mental Status: He is alert and oriented to person, place, and time.  Psychiatric:        Behavior: Behavior normal.     There were no vitals taken for this visit. Wt Readings from Last 3 Encounters:  04/15/20 223 lb (101.2 kg)  12/14/19 224 lb (101.6 kg)  08/28/19 229 lb (103.9 kg)    Diabetic Foot Exam - Simple   No data filed    Lab Results  Component Value Date   WBC 6.2 04/16/2020   HGB 14.2 04/16/2020   HCT 42.8 04/16/2020   PLT 243.0 04/16/2020   GLUCOSE 101 (H) 04/16/2020   CHOL 154 07/02/2019   TRIG 119.0 07/02/2019   HDL 42.70 07/02/2019   LDLCALC 88 07/02/2019   ALT 27 08/22/2019   AST 25 08/22/2019   NA 144 04/16/2020   K 3.9 04/16/2020   CL 105 04/16/2020   CREATININE 1.05 04/16/2020   BUN 24 (H) 04/16/2020   CO2 31 04/16/2020   TSH 0.91 02/18/2017   PSA 2.16 02/18/2017   HGBA1C 704 12/21/2019   MICROALBUR <0.7 07/30/2015    Lab Results  Component Value Date   TSH 0.91 02/18/2017   Lab Results  Component Value Date   WBC  6.2 04/16/2020   HGB 14.2 04/16/2020   HCT 42.8 04/16/2020   MCV 83.5 04/16/2020   PLT 243.0 04/16/2020   Lab Results  Component Value Date   NA 144 04/16/2020   K 3.9 04/16/2020   CO2 31 04/16/2020   GLUCOSE 101 (H) 04/16/2020   BUN 24 (H) 04/16/2020   CREATININE 1.05 04/16/2020   BILITOT 0.5 08/22/2019   ALKPHOS 67 08/22/2019   AST 25 08/22/2019   ALT 27 08/22/2019   PROT 6.9 08/22/2019   ALBUMIN 4.0 08/22/2019   CALCIUM 9.4 04/16/2020   ANIONGAP 12 08/22/2019   GFR 72.57 04/16/2020   Lab Results  Component Value Date   CHOL 154 07/02/2019   Lab Results  Component Value Date   HDL 42.70 07/02/2019   Lab Results  Component Value Date   LDLCALC 88 07/02/2019   Lab Results  Component Value Date   TRIG 119.0 07/02/2019    Lab Results  Component Value Date   CHOLHDL 4 07/02/2019   Lab Results  Component Value Date   HGBA1C 704 12/21/2019       Assessment & Plan:   Problem List Items Addressed This Visit   None      No orders of the defined types were placed in this encounter.   I, Debbrah Alar NP, personally preformed the services described in this documentation.  All medical record entries made by the scribe were at my direction and in my presence.  I have reviewed the chart and discharge instructions (if applicable) and agree that the record reflects my personal performance and is accurate and complete. 09/19/2020   I,Shehryar Baig,acting as a Education administrator for Nance Pear, NP.,have documented all relevant documentation on the behalf of Nance Pear, NP,as directed by  Nance Pear, NP while in the presence of Nance Pear, NP.   Shehryar [employer]

## 2020-09-19 NOTE — Patient Instructions (Addendum)
Please complete lab work prior to leaving.  Call if you develop recurrent blood in urine, burning with urination or back pain.  Change your lasix to daily as needed for swelling.

## 2020-09-19 NOTE — Assessment & Plan Note (Signed)
Tolerating pravachol 80mg . Continue same. Check LFT/Lipid panel.

## 2020-09-19 NOTE — Assessment & Plan Note (Signed)
BP a bit soft today. He is taking lasix every day and states if he misses a dose he does not have swelling. Advised pt to change to daily PRN swelling. Continue clonidine 0.3, amlodipine 5mg , HCTZ 25mg , lisinopril 40mg .

## 2020-09-20 ENCOUNTER — Other Ambulatory Visit: Payer: Self-pay | Admitting: Family

## 2020-09-23 ENCOUNTER — Ambulatory Visit: Payer: BC Managed Care – PPO | Attending: Internal Medicine

## 2020-09-23 DIAGNOSIS — Z23 Encounter for immunization: Secondary | ICD-10-CM

## 2020-09-23 NOTE — Progress Notes (Signed)
   Covid-19 Vaccination Clinic  Name:  Keith Hardin    MRN: [id number] DOB: 1950/09/28  09/23/2020  Keith Hardin was observed post Covid-19 immunization for 15 minutes without incident. He was provided with Vaccine Information Sheet and instruction to access the V-Safe system.   Keith Hardin was instructed to call 911 with any severe reactions post vaccine: Marland Kitchen Difficulty breathing  . Swelling of face and throat  . A fast heartbeat  . A bad rash all over body  . Dizziness and weakness   Immunizations Administered    Name Date Dose VIS Date Route   PFIZER Comrnaty(Gray TOP) Covid-19 Vaccine 09/23/2020  2:45 PM 0.3 mL 04/03/2020 Intramuscular   Manufacturer: [place]   Lot: [id number]   NDC: [phone]

## 2020-09-23 NOTE — Progress Notes (Signed)
Mailed out to pt 

## 2020-09-29 ENCOUNTER — Other Ambulatory Visit (HOSPITAL_BASED_OUTPATIENT_CLINIC_OR_DEPARTMENT_OTHER): Payer: Self-pay

## 2020-09-29 MED ORDER — PFIZER-BIONT COVID-19 VAC-TRIS 30 MCG/0.3ML IM SUSP
INTRAMUSCULAR | 0 refills | Status: DC
  Filled 2020-09-29: qty 0.3, 1d supply, fill #0

## 2020-09-30 ENCOUNTER — Other Ambulatory Visit (HOSPITAL_BASED_OUTPATIENT_CLINIC_OR_DEPARTMENT_OTHER): Payer: Self-pay

## 2020-10-30 ENCOUNTER — Other Ambulatory Visit: Payer: Self-pay

## 2020-10-31 ENCOUNTER — Encounter: Payer: Self-pay | Admitting: Internal Medicine

## 2020-10-31 ENCOUNTER — Other Ambulatory Visit: Payer: Self-pay

## 2020-10-31 ENCOUNTER — Ambulatory Visit: Payer: BC Managed Care – PPO | Admitting: Internal Medicine

## 2020-10-31 VITALS — BP 116/62 | HR 61 | Temp 98.5°F | Resp 16 | Ht 64.0 in | Wt 228.0 lb

## 2020-10-31 DIAGNOSIS — T7840XA Allergy, unspecified, initial encounter: Secondary | ICD-10-CM

## 2020-10-31 MED ORDER — BETAMETHASONE DIPROPIONATE AUG 0.05 % EX CREA: TOPICAL_CREAM | Freq: Two times a day (BID) | CUTANEOUS | 0 refills | Status: DC

## 2020-10-31 MED ORDER — TRIAMCINOLONE ACETONIDE 0.1 % EX LOTN: 1.0000 "application " | TOPICAL_LOTION | Freq: Three times a day (TID) | CUTANEOUS | 0 refills | Status: DC

## 2020-10-31 NOTE — Patient Instructions (Signed)
I sent a cream and a lotion  Use the lotion on your feet twice daily for 1 week  Use the cream on top of the bumps  twice a day for few days.  When you are doing yard work be sure you continue using long pants and a bug spray.   Immediately after doing yard work take a shower.  Call if you are not better

## 2020-10-31 NOTE — Progress Notes (Signed)
Subjective:    Patient ID: Keith Hardin, male    DOB: 1951/03/11, 70 y.o.   MRN: [id number]  DOS:  10/31/2020 Type of visit - description: Acute  2 weeks ago noted several bumps on the lower extremities bilaterally. They are extremely pruritic. He reports he does yard work frequently.  Denies fever chills No rash on the upper extremities, face, torso or abdomen. Older than that he feels well.  Review of Systems See above   Past Medical History:  Diagnosis Date   Allergy    Colon polyps    Diabetes mellitus    type II   GERD (gastroesophageal reflux disease)    Glaucoma    Heart murmur    Hyperlipidemia    Hypertension    Hypogonadism male 05/02/2011   Orchitis and epididymitis 01/2008   left with left hydrocele    Past Surgical History:  Procedure Laterality Date   ACHILLES TENDON REPAIR     right    Allergies as of 10/31/2020   No Known Allergies      Medication List        Accurate as of October 31, 2020  9:57 AM. If you have any questions, ask your nurse or doctor.          accu-chek multiclix lancets Use as directed 3 times daily to check blood sugar   amLODipine 5 MG tablet Commonly known as: NORVASC TAKE 1 TABLET BY MOUTH EVERY DAY   aspirin 81 MG tablet Take 81 mg by mouth daily.   azelastine 0.1 % nasal spray Commonly known as: ASTELIN INSTILL 2 SPRAYS INTO BOTH NOSTRILS AT BEDTIME AS NEEDED FOR RHINITIS AS DIRECTED   BD Pen Needle Nano 2nd Gen 32G X 4 MM Misc Generic drug: Insulin Pen Needle USE AS DIRECTED TO INJECT INSULIN   brimonidine 0.2 % ophthalmic solution Commonly known as: ALPHAGAN INSTILL 1 DROP INTO AFFECTED EYE(S) BY OPHTHALMIC ROUTE EVERY 8 HOURS   cloNIDine 0.3 MG tablet Commonly known as: CATAPRES TAKE 1 TABLET(0.3 MG) BY MOUTH TWICE DAILY   dapagliflozin propanediol 10 MG Tabs tablet Commonly known as: FARXIGA Take 10 mg by mouth daily.   dorzolamide-timolol 22.3-6.8 MG/ML ophthalmic solution Commonly known  as: COSOPT 1 drop 2 (two) times daily.   fexofenadine 180 MG tablet Commonly known as: ALLEGRA Take 180 mg by mouth daily.   Fish Oil 1000 MG Caps Take 1 capsule by mouth 2 (two) times daily.   fluticasone 50 MCG/ACT nasal spray Commonly known as: FLONASE Place 2 sprays into both nostrils daily.   furosemide 20 MG tablet Commonly known as: LASIX TAKE 1 TABLET (20 MG TOTAL) BY MOUTH AS NEEDED. FOR SWELLING   glimepiride 4 MG tablet Commonly known as: AMARYL Take by mouth.   glucose blood test strip Commonly known as: Accu-Chek Aviva Use to test blood sugar three times daily.   hydrochlorothiazide 25 MG tablet Commonly known as: HYDRODIURIL Take 1 tablet (25 mg total) by mouth daily.   Iron 325 (65 Fe) MG Tabs Take 1 tablet by mouth daily.   ketorolac 0.5 % ophthalmic solution Commonly known as: ACULAR INSTILL 1 DROP INTO AFFECTED EYE(S) 4 TIMES DAILY   Klor-Con M20 20 MEQ tablet Generic drug: potassium chloride SA TAKE 1 TABLET BY MOUTH TWICE A DAY   lisinopril 40 MG tablet Commonly known as: ZESTRIL TAKE 1 TABLET BY MOUTH EVERY DAY   metFORMIN 1000 MG tablet Commonly known as: GLUCOPHAGE Take 1,000 mg by mouth 2 (  two) times daily with a meal.   miconazole 2 % cream Commonly known as: MICOTIN Apply 1 application topically 2 (two) times daily as needed.   Omeprazole-Sodium Bicarbonate 20-1100 MG Caps capsule Commonly known as: Zegerid OTC TAKE ONE CAPSULE BY MOUTH EVERY DAY BEFORE BREAKFAST   OVER THE COUNTER MEDICATION NATURE'S FORMULA HEALING HERBS TEA for healthy living.  1/2 teaspoon daily in water.   OVER THE COUNTER MEDICATION NATURE'S FORMULA.  CLEANSING HERBS for healthy living.  1/2 teaspoon daily in water.   pioglitazone 30 MG tablet Commonly known as: ACTOS TAKE 1 TABLET(30 MG) BY MOUTH DAILY   pravastatin 80 MG tablet Commonly known as: PRAVACHOL TAKE 1 TABLET(80 MG) BY MOUTH ONCE DAILY   Rybelsus 3 MG Tabs Generic drug:  Semaglutide Take 3 mg by mouth daily. 3mg  daily for 30 days, then increase to 7mg    Rybelsus 14 MG Tabs Generic drug: Semaglutide Take by mouth.   Rybelsus 7 MG Tabs Generic drug: Semaglutide Take 1 tablet by mouth daily.   Tyler Aas FlexTouch 100 UNIT/ML FlexTouch Pen Generic drug: insulin degludec Inject 40 Units into the skin daily.           Objective:   Physical Exam BP 116/62 (BP Location: Left Arm, Patient Position: Sitting, Cuff Size: Normal)   Pulse 61   Temp 98.5 F (36.9 C) (Oral)   Resp 16   Ht 5\' 4"  (1.626 m)   Wt 228 lb (103.4 kg)   SpO2 98%   BMI 39.14 kg/m  General:   Well developed, NAD, BMI noted. HEENT:  Normocephalic . Face symmetric, atraumatic Lower extremities: Mild pretibial and ankle edema noted. Several 1 or 2 mm bumps noted in both legs, they are erythematous, no blisters.  No other rashes observed. Skin: Not pale. Not jaundice Neurologic:  alert & oriented X3.  Speech normal, gait appropriate for age and unassisted Psych--  Cognition and judgment appear intact.  Cooperative with normal attention span and concentration.  Behavior appropriate. No anxious or depressed appearing.      Assessment        70 year old gentleman.  PMH includes diabetes, HTN, high cholesterol, morbid obesity, mild aortic stenosis.  Presents with:  Allergic reaction: Suspect he is having an allergic reaction to a mosquito?  Insect?  bites. He is diabetic, already taking and antihistaminic. Recommend prevention we will continue using long pants and a bug spray when at the yard. Rx Topical steroids lotion to the feet that seems to be the most itchy area and a topical steroid ointment for the bumps. Call if not better    This visit occurred during the SARS-CoV-2 public health emergency.  Safety protocols were in place, including screening questions prior to the visit, additional usage of staff PPE, and extensive cleaning of exam room while observing  appropriate contact time as indicated for disinfecting solutions.

## 2020-12-17 LAB — HM DIABETES EYE EXAM

## 2020-12-23 ENCOUNTER — Other Ambulatory Visit: Payer: Self-pay

## 2020-12-23 ENCOUNTER — Telehealth: Payer: Self-pay | Admitting: Family

## 2020-12-23 ENCOUNTER — Ambulatory Visit: Payer: BC Managed Care – PPO | Admitting: Family

## 2020-12-23 VITALS — BP 120/54 | HR 60 | Temp 98.4°F | Resp 16 | Wt 227.0 lb

## 2020-12-23 DIAGNOSIS — I10 Essential (primary) hypertension: Secondary | ICD-10-CM

## 2020-12-23 DIAGNOSIS — E119 Type 2 diabetes mellitus without complications: Secondary | ICD-10-CM

## 2020-12-23 DIAGNOSIS — Z23 Encounter for immunization: Secondary | ICD-10-CM | POA: Diagnosis not present

## 2020-12-23 DIAGNOSIS — E7800 Pure hypercholesterolemia, unspecified: Secondary | ICD-10-CM | POA: Diagnosis not present

## 2020-12-23 NOTE — Progress Notes (Signed)
Subjective:     Patient ID: Keith Hardin, male    DOB: 11-29-1950, 70 y.o.   MRN: [id number]  Chief Complaint  Patient presents with   Hyperlipidemia    Here for follow up    Hyperlipidemia  Patient is in today for follow up.  HTN- current bp meds include hctz '25mg'$ , amlodipine '5mg'$ , catapres 0.'3mg'$ , lasix '20mg'$ , Lisinopril '40mg'$ .  BP Readings from Last 3 Encounters:  12/23/20 (!) 120/54  10/31/20 116/62  09/19/20 (!) 107/45   Hyperlipidemia-  Lab Results  Component Value Date   CHOL 156 09/19/2020   HDL 43.00 09/19/2020   LDLCALC 78 09/19/2020   TRIG 176.0 (H) 09/19/2020   CHOLHDL 4 09/19/2020   DM2- seeing endo.   Lab Results  Component Value Date   HGBA1C 704 12/21/2019   HGBA1C 7.0 06/12/2019   HGBA1C 7.6 (H) 07/30/2015   Lab Results  Component Value Date   MICROALBUR <0.7 07/30/2015   [place]ickney 78 09/19/2020   CREATININE 0.97 09/19/2020     Health Maintenance Due  Topic Date Due   HEMOGLOBIN A1C  06/22/2020   FOOT EXAM  07/09/2020    Past Medical History:  Diagnosis Date   Allergy    Colon polyps    Diabetes mellitus    type II   GERD (gastroesophageal reflux disease)    Glaucoma    Heart murmur    Hyperlipidemia    Hypertension    Hypogonadism male 05/02/2011   Orchitis and epididymitis 01/2008   left with left hydrocele    Past Surgical History:  Procedure Laterality Date   ACHILLES TENDON REPAIR     right    Family History  Problem Relation Age of Onset   Seizures Mother    Hypertension Mother    Cancer Brother        stomach   Hypothyroidism Sister    Hypertension Brother    Colon cancer Neg Hx    Diabetes Neg Hx    Heart attack Neg Hx    Hyperlipidemia Neg Hx    Sudden death Neg Hx     Social History   Socioeconomic History   Marital status: Married    Spouse name: [name]   Number of children: 0   Years of education: Not on file   Highest education level: Not on file  Occupational History   Occupation: Agricultural consultant: UNEMPLOYED  Tobacco Use   Smoking status: Never   Smokeless tobacco: Never  Vaping Use   Vaping Use: Never used  Substance and Sexual Activity   Alcohol use: No   Drug use: No   Sexual activity: Not on file  Other Topics Concern   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     augmented betamethasone dipropionate (DIPROLENE-AF) 0.05 %  cream Apply topically 2 (two) times daily. 60 g 0   azelastine (ASTELIN) 0.1 % nasal spray INSTILL 2 SPRAYS INTO BOTH NOSTRILS AT BEDTIME AS NEEDED FOR RHINITIS AS DIRECTED 90 mL 1   BD PEN NEEDLE NANO 2ND GEN 32G X 4 MM MISC USE AS DIRECTED TO INJECT INSULIN 100 each 4   brimonidine (ALPHAGAN) 0.2 % ophthalmic solution INSTILL 1 DROP INTO AFFECTED EYE(S) BY OPHTHALMIC ROUTE EVERY 8 HOURS  6   cloNIDine (CATAPRES) 0.3 MG tablet TAKE 1 TABLET(0.3 MG) BY MOUTH TWICE DAILY 180 tablet 0   dapagliflozin propanediol (FARXIGA) 10 MG TABS tablet Take 10 mg by mouth daily.     dorzolamide-timolol (COSOPT) 22.3-6.8 MG/ML ophthalmic solution 1 drop 2 (two) times daily.     Ferrous Sulfate (IRON) 325 (65 Fe) MG TABS Take 1 tablet by mouth daily. 30 each 0   fexofenadine (ALLEGRA) 180 MG tablet Take 180 mg by mouth daily.     fluticasone (FLONASE) 50 MCG/ACT nasal spray Place 2 sprays into both nostrils daily.     furosemide (LASIX) 20 MG tablet TAKE 1 TABLET (20 MG TOTAL)  BY MOUTH AS NEEDED. FOR SWELLING 90 tablet 1   glimepiride (AMARYL) 4 MG tablet Take by mouth.     glucose blood (ACCU-CHEK AVIVA) test strip Use to test blood sugar three times daily. 50 each 1   hydrochlorothiazide (HYDRODIURIL) 25 MG tablet Take 1 tablet (25 mg total) by mouth daily. 90 tablet 1   ketorolac (ACULAR) 0.5 % ophthalmic solution INSTILL 1 DROP INTO AFFECTED EYE(S) 4 TIMES DAILY  1   KLOR-CON M20 20 MEQ tablet TAKE 1 TABLET BY MOUTH TWICE A DAY 180 tablet 2   Lancets (ACCU-CHEK MULTICLIX) lancets Use as directed 3 times daily to check blood sugar 102 each 2   lisinopril (ZESTRIL) 40 MG tablet TAKE 1 TABLET BY MOUTH EVERY DAY 90 tablet 1   metFORMIN (GLUCOPHAGE) 1000 MG tablet Take 1,000 mg by mouth 2 (two) times daily with a meal.     miconazole (MICOTIN) 2 % cream Apply 1 application topically 2 (two) times daily as needed. 28.35 g 0   Omega-3 Fatty Acids (FISH OIL) 1000 MG CAPS Take 1 capsule by mouth 2 (two) times daily.     Omeprazole-Sodium Bicarbonate (ZEGERID OTC) 20-1100 MG CAPS capsule TAKE ONE CAPSULE BY MOUTH EVERY DAY BEFORE BREAKFAST 28 capsule 5   OVER THE COUNTER MEDICATION NATURE'S FORMULA HEALING HERBS TEA for healthy living.  1/2 teaspoon daily in water.     OVER THE COUNTER MEDICATION NATURE'S FORMULA.  CLEANSING HERBS for healthy living.  1/2 teaspoon daily in water.     pioglitazone (ACTOS) 30 MG tablet TAKE 1 TABLET(30 MG) BY MOUTH DAILY 90 tablet 0   pravastatin (PRAVACHOL) 80 MG tablet TAKE 1 TABLET(80 MG) BY MOUTH ONCE DAILY 90 tablet 1   Semaglutide (RYBELSUS) 14 MG TABS Take by mouth.     Semaglutide (RYBELSUS) 7 MG TABS Take 1 tablet by mouth daily. (Patient not taking: No sig reported)     TRESIBA FLEXTOUCH 100 UNIT/ML SOPN FlexTouch Pen Inject 40 Units into the skin daily.  2   triamcinolone lotion (KENALOG) 0.1 % Apply 1 application topically 3 (three) times daily. 120 mL 0   No facility-administered medications prior to visit.    No Known  Allergies  ROS See HPI    Objective:    Physical Exam Constitutional:      General: He is not in acute distress.  Appearance: He is well-developed.  HENT:     Head: Normocephalic and atraumatic.  Cardiovascular:     Rate and Rhythm: Normal rate and regular rhythm.     Heart sounds: No murmur heard. Pulmonary:     Effort: Pulmonary effort is normal. No respiratory distress.     Breath sounds: Normal breath sounds. No wheezing or rales.  Skin:    General: Skin is warm and dry.  Neurological:     Mental Status: He is alert and oriented to person, place, and time.  Psychiatric:        Behavior: Behavior normal.        Thought Content: Thought content normal.    BP (!) 120/54 (BP Location: Right Arm, Patient Position: Sitting, Cuff Size: Large)   Pulse 60   Temp 98.4 F (36.9 C) (Oral)   Resp 16   Wt 227 lb (103 kg)   SpO2 98%   BMI 38.96 kg/m  Wt Readings from Last 3 Encounters:  12/23/20 227 lb (103 kg)  10/31/20 228 lb (103.4 kg)  09/19/20 223 lb (101.2 kg)       Assessment & Plan:   Problem List Items Addressed This Visit       Unprioritized   Hyperlipidemia    Maintained on pravachol '80mg'$ . Continue same. Discussed dietary changes to lower triglycerides.       Essential hypertension    BP is at goal. Continue hctz '25mg'$ , amlodipine '5mg'$ , catapres 0.'3mg'$ , lasix '20mg'$ , Lisinopril '40mg'$ .       Diabetes type 2, controlled (HCC)    Last A1C was 6.9 on 3/31.  Management per Endo.       Other Visit Diagnoses     Needs flu shot    -  Primary   Relevant Orders   Flu Vaccine QUAD High Dose(Fluad) (Completed)   Primary hypertension       Relevant Orders   Basic metabolic panel       I am having [name] maintain his aspirin, accu-chek multiclix, Fish Oil, fluticasone, fexofenadine, glucose blood, dapagliflozin propanediol, Omeprazole-Sodium Bicarbonate, Iron, pioglitazone, brimonidine, ketorolac, OVER THE COUNTER MEDICATION, OVER THE COUNTER  MEDICATION, Tyler Aas FlexTouch, azelastine, glimepiride, metFORMIN, Rybelsus, dorzolamide-timolol, BD Pen Needle Nano 2nd Gen, furosemide, K[place]-Con M20, hydrochlorothiazide, amLODipine, lisinopril, pravastatin, miconazole, cloNIDine, Rybelsus, augmented betamethasone dipropionate, and triamcinolone lotion.  No orders of the defined types were placed in this encounter.

## 2020-12-23 NOTE — Assessment & Plan Note (Signed)
Maintained on pravachol '80mg'$ . Continue same. Discussed dietary changes to lower triglycerides.

## 2020-12-23 NOTE — Assessment & Plan Note (Signed)
BP is at goal. Continue hctz '25mg'$ , amlodipine '5mg'$ , catapres 0.'3mg'$ , lasix '20mg'$ , Lisinopril '40mg'$ .

## 2020-12-23 NOTE — Assessment & Plan Note (Signed)
Last A1C was 6.9 on 3/31.  Management per Endo.

## 2020-12-23 NOTE — Telephone Encounter (Signed)
Could you please abstract A1C from [facility] records to our records? tks

## 2020-12-23 NOTE — Patient Instructions (Signed)
Please complete lab work prior to leaving.   

## 2020-12-24 ENCOUNTER — Telehealth: Payer: Self-pay | Admitting: Family

## 2020-12-24 LAB — BASIC METABOLIC PANEL
BUN: 31 mg/dL — ABNORMAL HIGH (ref 6–23)
CO2: 26 mEq/L (ref 19–32)
Calcium: 9.4 mg/dL (ref 8.4–10.5)
Chloride: 101 mEq/L (ref 96–112)
Creatinine, Ser: 1.45 mg/dL (ref 0.40–1.50)
GFR: 49.03 mL/min — ABNORMAL LOW (ref >60.00)
Glucose, Bld: 116 mg/dL — ABNORMAL HIGH (ref 70–99)
Potassium: 4.2 mEq/L (ref 3.5–5.1)
Sodium: 140 mEq/L (ref 135–145)

## 2020-12-24 NOTE — Telephone Encounter (Signed)
Kidney function has worsened slightly since we last checked.  Ongoing blood pressure and sugar control are important for his kidney health.  He should stay well hydrated and avoid use of NSAIDS as well.

## 2020-12-24 NOTE — Telephone Encounter (Signed)
Patient advised of results and the importance of bp and sugar control as well as staying hydrated.  He verbalized understanding

## 2020-12-25 NOTE — Telephone Encounter (Signed)
A1c abstracted and copy sent for scanning

## 2021-01-28 ENCOUNTER — Other Ambulatory Visit: Payer: Self-pay

## 2021-01-28 ENCOUNTER — Ambulatory Visit: Payer: BC Managed Care – PPO | Admitting: Family Medicine

## 2021-01-28 VITALS — BP 113/52 | HR 68 | Temp 97.7°F | Ht 64.0 in | Wt 227.6 lb

## 2021-01-28 DIAGNOSIS — I952 Hypotension due to drugs: Secondary | ICD-10-CM

## 2021-01-28 DIAGNOSIS — R051 Acute cough: Secondary | ICD-10-CM | POA: Diagnosis not present

## 2021-01-28 DIAGNOSIS — R0981 Nasal congestion: Secondary | ICD-10-CM

## 2021-01-28 LAB — POCT INFLUENZA A/B
Influenza A, POC: NEGATIVE
Influenza B, POC: NEGATIVE

## 2021-01-28 MED ORDER — BENZONATATE 200 MG PO CAPS: 200.0000 mg | ORAL_CAPSULE | Freq: Three times a day (TID) | ORAL | 0 refills | Status: DC | PRN

## 2021-01-28 NOTE — Patient Instructions (Addendum)
It looks like you have a viral infection- a "cold" If you are getting worse or not improving over the next several days please alert me  Use the tessalon perles for cough Pick up some Afrin nasal spray- generic is ok- and use at night for the next 3-5 days to help with nasal congestion   Your BP is on the low side- let's have you NOT take your amlodipine and do a nurse visit in 1-2 weeks to check on your BP numbers

## 2021-01-28 NOTE — Progress Notes (Signed)
[place] [facility] [address] [place], [place] [address] [phone]  Date:  01/28/2021   Name:  Keith Hardin   DOB:  Aug 07, 1950   MRN:  [id number]  PCP:  Debbrah Alar, NP    Chief Complaint: URI (Negative Covid test 10.4. Congested and coughing. X Sunday night. Has tried: Corisiden)   History of Present Illness:  Keith Hardin is a 70 y.o. very pleasant male patient who presents with the following:  Pt seen today with concern of illness- negative for covid and vaccinated  Pt of Debbrah Alar Last seen by PCP in August- history of DM, HTN, hyperlipidemia DM is under good control per endocrinology  Lab Results  Component Value Date   HGBA1C 6.9 07/24/2020   He noted onset of sx of illness this past Sunday- today is Wednesday Stuffy, coughing, runny nose No fever noted Covid test yesterday negative No vomiting or diarrhea He is having a lot of cough at night He is sore from coughing He does feel tired out but not overly achy or terrible   BP Readings from Last 3 Encounters:  01/28/21 (!) 113/52  12/23/20 (!) 120/54  10/31/20 116/62     Patient Active Problem List   Diagnosis Date Noted   Kidney stone 09/19/2020   Balanitis 09/19/2020   External hemorrhoid 08/03/2019   Rectal bleed 08/03/2019   Left foot pain 03/02/2018   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   Obesity 11/15/2016   Anemia, iron deficiency 10/27/2015   Erectile dysfunction 10/27/2015   Hyperlipidemia 10/21/2014   Preventative health care 04/12/2014   Blister of toe of right foot without infection 07/20/2013   Mild aortic stenosis 07/05/2012   GERD (gastroesophageal reflux disease) 07/30/2011   Shoulder pain 07/05/2011   Hypogonadism male 05/02/2011   COLONIC POLYPS, HX OF 11/09/2007   Diabetes type 2, controlled ([place]) 09/13/2006   Essential hypertension 09/13/2006   Allergic rhinitis 09/13/2006    Past Medical  History:  Diagnosis Date   Allergy    Colon polyps    Diabetes mellitus    type II   GERD (gastroesophageal reflux disease)    Glaucoma    Heart murmur    Hyperlipidemia    Hypertension    Hypogonadism male 05/02/2011   Orchitis and epididymitis 01/2008   left with left hydrocele    Past Surgical History:  Procedure Laterality Date   ACHILLES TENDON REPAIR     right    Social History   Tobacco Use   Smoking status: Never   Smokeless tobacco: Never  Vaping Use   Vaping Use: Never used  Substance Use Topics   Alcohol use: No   Drug use: No    Family History  Problem Relation Age of Onset   Seizures Mother    Hypertension Mother    Cancer Brother        stomach   Hypothyroidism Sister    Hypertension Brother    Colon cancer Neg Hx    Diabetes Neg Hx    Heart attack Neg Hx    Hyperlipidemia Neg Hx    Sudden death Neg Hx     No Known Allergies  Medication list has been reviewed and updated.  Current Outpatient Medications on File Prior to Visit  Medication Sig Dispense Refill   amLODipine (NORVASC) 5 MG tablet TAKE 1 TABLET BY MOUTH EVERY DAY 90 tablet 1   aspirin 81 MG tablet  Take 81 mg by mouth daily.     augmented betamethasone dipropionate (DIPROLENE-AF) 0.05 % cream Apply topically 2 (two) times daily. 60 g 0   azelastine (ASTELIN) 0.1 % nasal spray INSTILL 2 SPRAYS INTO BOTH NOSTRILS AT BEDTIME AS NEEDED FOR RHINITIS AS DIRECTED 90 mL 1   BD PEN NEEDLE NANO 2ND GEN 32G X 4 MM MISC USE AS DIRECTED TO INJECT INSULIN 100 each 4   brimonidine (ALPHAGAN) 0.2 % ophthalmic solution INSTILL 1 DROP INTO AFFECTED EYE(S) BY OPHTHALMIC ROUTE EVERY 8 HOURS  6   cloNIDine (CATAPRES) 0.3 MG tablet TAKE 1 TABLET(0.3 MG) BY MOUTH TWICE DAILY 180 tablet 0   dapagliflozin propanediol (FARXIGA) 10 MG TABS tablet Take 10 mg by mouth daily.     dorzolamide-timolol (COSOPT) 22.3-6.8 MG/ML ophthalmic solution 1 drop 2 (two) times daily.     Ferrous Sulfate (IRON) 325 (65 Fe) MG  TABS Take 1 tablet by mouth daily. 30 each 0   fexofenadine (ALLEGRA) 180 MG tablet Take 180 mg by mouth daily.     fluticasone (FLONASE) 50 MCG/ACT nasal spray Place 2 sprays into both nostrils daily.     furosemide (LASIX) 20 MG tablet TAKE 1 TABLET (20 MG TOTAL) BY MOUTH AS NEEDED. FOR SWELLING 90 tablet 1   glimepiride (AMARYL) 4 MG tablet Take by mouth.     glucose blood (ACCU-CHEK AVIVA) test strip Use to test blood sugar three times daily. 50 each 1   hydrochlorothiazide (HYDRODIURIL) 25 MG tablet Take 1 tablet (25 mg total) by mouth daily. 90 tablet 1   ketorolac (ACULAR) 0.5 % ophthalmic solution INSTILL 1 DROP INTO AFFECTED EYE(S) 4 TIMES DAILY  1   KLOR-CON M20 20 MEQ tablet TAKE 1 TABLET BY MOUTH TWICE A DAY 180 tablet 2   Lancets (ACCU-CHEK MULTICLIX) lancets Use as directed 3 times daily to check blood sugar 102 each 2   lisinopril (ZESTRIL) 40 MG tablet TAKE 1 TABLET BY MOUTH EVERY DAY 90 tablet 1   metFORMIN (GLUCOPHAGE) 1000 MG tablet Take 1,000 mg by mouth 2 (two) times daily with a meal.     miconazole (MICOTIN) 2 % cream Apply 1 application topically 2 (two) times daily as needed. 28.35 g 0   Omega-3 Fatty Acids (FISH OIL) 1000 MG CAPS Take 1 capsule by mouth 2 (two) times daily.     Omeprazole-Sodium Bicarbonate (ZEGERID OTC) 20-1100 MG CAPS capsule TAKE ONE CAPSULE BY MOUTH EVERY DAY BEFORE BREAKFAST 28 capsule 5   OVER THE COUNTER MEDICATION NATURE'S FORMULA HEALING HERBS TEA for healthy living.  1/2 teaspoon daily in water.     OVER THE COUNTER MEDICATION NATURE'S FORMULA.  CLEANSING HERBS for healthy living.  1/2 teaspoon daily in water.     pioglitazone (ACTOS) 30 MG tablet TAKE 1 TABLET(30 MG) BY MOUTH DAILY 90 tablet 0   pravastatin (PRAVACHOL) 80 MG tablet TAKE 1 TABLET(80 MG) BY MOUTH ONCE DAILY 90 tablet 1   Semaglutide (RYBELSUS) 14 MG TABS Take by mouth.     Semaglutide (RYBELSUS) 7 MG TABS Take 1 tablet by mouth daily.     TRESIBA FLEXTOUCH 100 UNIT/ML SOPN  FlexTouch Pen Inject 40 Units into the skin daily.  2   triamcinolone lotion (KENALOG) 0.1 % Apply 1 application topically 3 (three) times daily. 120 mL 0   No current facility-administered medications on file prior to visit.    Review of Systems:  As per HPI- otherwise negative.   Physical Examination: Vitals:  01/28/21 1347  BP: (!) 113/52  Pulse: 68  Temp: 97.7 F (36.5 C)  SpO2: 100%   Vitals:   01/28/21 1347  Weight: 227 lb 9.6 oz (103.2 kg)  Height: 5\' 4"  (1.626 m)   Body mass index is 39.07 kg/m. Ideal Body Weight: Weight in (lb) to have BMI = 25: 145.3  GEN: no acute distress.  Obese, looks well  HEENT: Atraumatic, Normocephalic.  Bilateral TM wnl, oropharynx normal.  PEERL,EOMI.   Ears and Nose: No external deformity. CV: RRR, No M/G/R. No JVD. No thrill. No extra heart sounds. PULM: CTA B, no wheezes, crackles, rhonchi. No retractions. No resp. distress. No accessory muscle use. ABD: S, NT, ND, +BS. No rebound. No HSM. EXTR: No c/c/e PSYCH: Normally interactive. Conversant.   Results for orders placed or performed in visit on 01/28/21  POCT Influenza A/B  Result Value Ref Range   Influenza A, POC Negative Negative   Influenza B, POC Negative Negative    Assessment and Plan: Nasal congestion  Acute cough - Plan: POCT Influenza A/B, benzonatate (TESSALON) 200 MG capsule  Hypotension due to drugs Likely viral URI Tessalon as needed, afrin for nasal congestion Noted low BP on the last few visits  BP Readings from Last 3 Encounters:  01/28/21 (!) 113/52  12/23/20 (!) 120/54  10/31/20 116/62   Asked him to hold amlodipine and make a nurse visit in 1-2 weeks to recheck BP- he does not have a home cuff   Signed Lamar Blinks, MD

## 2021-02-10 ENCOUNTER — Other Ambulatory Visit: Payer: Self-pay | Admitting: Internal Medicine

## 2021-02-10 ENCOUNTER — Other Ambulatory Visit: Payer: Self-pay | Admitting: Family

## 2021-02-10 ENCOUNTER — Other Ambulatory Visit: Payer: Self-pay

## 2021-02-10 ENCOUNTER — Other Ambulatory Visit: Payer: Self-pay | Admitting: Family Medicine

## 2021-02-10 ENCOUNTER — Ambulatory Visit (INDEPENDENT_AMBULATORY_CARE_PROVIDER_SITE_OTHER): Payer: BC Managed Care – PPO | Admitting: Family

## 2021-02-10 VITALS — BP 112/62 | HR 59

## 2021-02-10 DIAGNOSIS — I10 Essential (primary) hypertension: Secondary | ICD-10-CM

## 2021-02-10 DIAGNOSIS — R051 Acute cough: Secondary | ICD-10-CM

## 2021-02-10 NOTE — Progress Notes (Signed)
Pt here for Blood pressure check per Dr. Lorelei Pont   Pt currently takes: lisinopril 40mg , amlodipine 5mg , hctz 25mg , and clonidine 0.3mg .  He was asked to hold amlodipine due to blood pressure being hypotensive and follow up in 1-2 weeks for blood pressure check.  BP Readings from Last 3 Encounters:  01/28/21 (!) 113/52  12/23/20 (!) 120/54  10/31/20 116/62     Pt reports compliance with medication.  BP today @ = 112/62 HR = 59  Pt advised per Debbrah Alar continue to hold amlodipine.  Will recheck at next visit.

## 2021-03-14 ENCOUNTER — Other Ambulatory Visit: Payer: Self-pay | Admitting: Family

## 2021-03-27 ENCOUNTER — Other Ambulatory Visit: Payer: Self-pay | Admitting: Family Medicine

## 2021-04-22 ENCOUNTER — Other Ambulatory Visit: Payer: Self-pay | Admitting: Family

## 2021-05-12 ENCOUNTER — Ambulatory Visit: Payer: BC Managed Care – PPO | Attending: Internal Medicine

## 2021-05-12 DIAGNOSIS — Z23 Encounter for immunization: Secondary | ICD-10-CM

## 2021-05-12 NOTE — Progress Notes (Signed)
° °  Covid-19 Vaccination Clinic  Name:  Keith Hardin    MRN: [id number] DOB: [birth year]  05/12/2021  Keith Hardin was observed post Covid-19 immunization for 15 minutes without incident. He was provided with Vaccine Information Sheet and instruction to access the V-Safe system.   Keith Hardin was instructed to call [phone] with any severe reactions post vaccine: Difficulty breathing  Swelling of face and throat  A fast heartbeat  A bad rash all over body  Dizziness and weakness   Immunizations Administered     Name Date Dose VIS Date Route   Pfizer Covid-19 Vaccine Bivalent Booster 05/12/2021  2:02 PM 0.3 mL 12/24/2020 Intramuscular   Manufacturer: [place]   Lot: [id number]   [place]: [phone]

## 2021-05-14 ENCOUNTER — Other Ambulatory Visit (HOSPITAL_BASED_OUTPATIENT_CLINIC_OR_DEPARTMENT_OTHER): Payer: Self-pay

## 2021-05-14 MED ORDER — PFIZER COVID-19 VAC BIVALENT 30 MCG/0.3ML IM SUSP
INTRAMUSCULAR | 0 refills | Status: DC
  Filled 2021-05-14: qty 0.3, 1d supply, fill #0

## 2021-06-14 ENCOUNTER — Other Ambulatory Visit: Payer: Self-pay | Admitting: Family

## 2021-06-23 ENCOUNTER — Ambulatory Visit: Payer: BC Managed Care – PPO | Admitting: Family

## 2021-06-23 VITALS — BP 135/58 | HR 58 | Temp 98.1°F | Resp 16 | Wt 225.0 lb

## 2021-06-23 DIAGNOSIS — E669 Obesity, unspecified: Secondary | ICD-10-CM

## 2021-06-23 DIAGNOSIS — E119 Type 2 diabetes mellitus without complications: Secondary | ICD-10-CM

## 2021-06-23 DIAGNOSIS — E7800 Pure hypercholesterolemia, unspecified: Secondary | ICD-10-CM

## 2021-06-23 DIAGNOSIS — I10 Essential (primary) hypertension: Secondary | ICD-10-CM

## 2021-06-23 DIAGNOSIS — L84 Corns and callosities: Secondary | ICD-10-CM

## 2021-06-23 MED ORDER — PRAVASTATIN SODIUM 80 MG PO TABS: ORAL_TABLET | ORAL | 1 refills | Status: DC

## 2021-06-23 MED ORDER — HYDROCHLOROTHIAZIDE 25 MG PO TABS: 25.0000 mg | ORAL_TABLET | Freq: Every day | ORAL | 1 refills | Status: DC

## 2021-06-23 NOTE — Assessment & Plan Note (Addendum)
Lab Results  Component Value Date   CHOL 156 09/19/2020   HDL 43.00 09/19/2020   LDLCALC 78 09/19/2020   TRIG 176.0 (H) 09/19/2020   CHOLHDL 4 09/19/2020   Tolerating statin. Obtain follow up lipid panel.

## 2021-06-23 NOTE — Patient Instructions (Signed)
Please complete lab work prior to leaving.   

## 2021-06-23 NOTE — Assessment & Plan Note (Addendum)
Last A1C 6.8 at endo. Has follow up scheduled.

## 2021-06-23 NOTE — Assessment & Plan Note (Signed)
Recommended that he apply otc corn pads. If no improvement, let me know and I will refer to podiatry.

## 2021-06-23 NOTE — Progress Notes (Signed)
Subjective:   By signing my name below, I, Keith Hardin, attest that this documentation has been prepared under the direction and in the presence of Debbrah Alar, NP. 06/23/2021    Patient ID: Keith Hardin, male    DOB: Oct 23, 1950, 71 y.o.   MRN: [id number]  Chief Complaint  Patient presents with   Hypertension    Here for follow up    HPI Patient is in today for office visit.  Blood Sugar - A1C levels appear to be normal at this time.  Lab Results  Component Value Date   HGBA1C 6.9 07/24/2020    Blood Pressure - Blood Pressure appears normal. Patient has stoppped using amlodipine. He's recommended to discontinue medication. BP Readings from Last 3 Encounters:  06/23/21 (!) 135/58  02/10/21 112/62  01/28/21 (!) 113/52   Vision -Patient is up to date on eye exams.  Testosterone - Patient is not taking any testosterone supplements.   Diet/Exercise - Patient does not have a regular exercise routine. Patient also has not been keeping with a regular diet. He has lost about 2 lbs since last visit.   Corns - Patient is concerned about corns on toes.Corn right small toe laterally Patient is recommended to take OTC patches.   Past Medical History:  Diagnosis Date   Allergy    Colon polyps    Diabetes mellitus    type II   GERD (gastroesophageal reflux disease)    Glaucoma    Heart murmur    Hyperlipidemia    Hypertension    Hypogonadism male 05/02/2011   Orchitis and epididymitis 01/2008   left with left hydrocele    Past Surgical History:  Procedure Laterality Date   ACHILLES TENDON REPAIR     right    Family History  Problem Relation Age of Onset   Seizures Mother    Hypertension Mother    Cancer Brother        stomach   Hypothyroidism Sister    Hypertension Brother    Colon cancer Neg Hx    Diabetes Neg Hx    Heart attack Neg Hx    Hyperlipidemia Neg Hx    Sudden death Neg Hx     Social History   Socioeconomic History   Marital status:  Married    Spouse name: [name]   Number of children: 0   Years of education: Not on file   Highest education level: Not on file  Occupational History   Occupation: Merchandiser, retail: UNEMPLOYED  Tobacco Use   Smoking status: Never   Smokeless tobacco: Never  Vaping Use   Vaping Use: Never used  Substance and Sexual Activity   Alcohol use: No   Drug use: No   Sexual activity: Not on file  Other Topics Concern   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     augmented betamethasone dipropionate (DIPROLENE-AF) 0.05 %  cream APPLY TOPICALLY TWICE A DAY 50 g 0   azelastine (ASTELIN) 0.1 % nasal spray INSTILL 2 SPRAYS INTO BOTH NOSTRILS AT BEDTIME AS NEEDED FOR RHINITIS AS DIRECTED 90 mL 1   BD PEN NEEDLE NANO 2ND GEN 32G X 4 MM MISC USE AS DIRECTED TO INJECT INSULIN 100 each 4   brimonidine (ALPHAGAN) 0.2 % ophthalmic solution INSTILL 1 DROP INTO AFFECTED EYE(S) BY OPHTHALMIC ROUTE EVERY 8 HOURS  6   cloNIDine (CATAPRES) 0.3 MG tablet TAKE 1 TABLET(0.3 MG) BY MOUTH TWICE DAILY 180 tablet 2   COVID-19 mRNA bivalent vaccine, Pfizer, (PFIZER COVID-19 VAC BIVALENT) injection Inject into the muscle. 0.3 mL 0   dapagliflozin propanediol (FARXIGA) 10 MG TABS tablet Take 10 mg by mouth daily.     dorzolamide-timolol (COSOPT) 22.3-6.8 MG/ML ophthalmic solution 1 drop 2 (two) times daily.     Ferrous Sulfate (IRON) 325 (65 Fe) MG TABS Take 1 tablet by mouth daily. 30 each 0   fexofenadine  (ALLEGRA) 180 MG tablet Take 180 mg by mouth daily.     fluticasone (FLONASE) 50 MCG/ACT nasal spray Place 2 sprays into both nostrils daily.     furosemide (LASIX) 20 MG tablet TAKE 1 TABLET (20 MG TOTAL) BY MOUTH AS NEEDED. FOR SWELLING 90 tablet 1   glimepiride (AMARYL) 4 MG tablet Take by mouth.     glucose blood (ACCU-CHEK AVIVA) test strip Use to test blood sugar three times daily. 50 each 1   ketorolac (ACULAR) 0.5 % ophthalmic solution INSTILL 1 DROP INTO AFFECTED EYE(S) 4 TIMES DAILY  1   KLOR-CON M20 20 MEQ tablet TAKE 1 TABLET BY MOUTH TWICE A DAY 180 tablet 2   Lancets (ACCU-CHEK MULTICLIX) lancets Use as directed 3 times daily to check blood sugar 102 each 2   lisinopril (ZESTRIL) 40 MG tablet TAKE 1 TABLET BY MOUTH EVERY DAY 90 tablet 1   metFORMIN (GLUCOPHAGE) 1000 MG tablet Take 1,000 mg by mouth 2 (two) times daily with a meal.     miconazole (MICOTIN) 2 % cream Apply 1 application topically 2 (two) times daily as needed. 28.35 g 0   Omega-3 Fatty Acids (FISH OIL) 1000 MG CAPS Take 1 capsule by mouth 2 (two) times daily.     Omeprazole-Sodium Bicarbonate (ZEGERID OTC) 20-1100 MG CAPS capsule TAKE ONE CAPSULE BY MOUTH EVERY DAY BEFORE BREAKFAST 28 capsule 5   OVER THE COUNTER MEDICATION NATURE'S FORMULA HEALING HERBS TEA for healthy living.  1/2 teaspoon daily in water.     OVER THE COUNTER MEDICATION NATURE'S FORMULA.  CLEANSING HERBS for healthy living.  1/2 teaspoon daily in water.     pioglitazone (ACTOS) 30 MG tablet TAKE 1 TABLET(30 MG) BY MOUTH DAILY 90 tablet 0   Semaglutide (RYBELSUS) 14 MG TABS Take by mouth.     Semaglutide (RYBELSUS) 7 MG TABS Take 1 tablet by mouth daily.     TRESIBA FLEXTOUCH 100 UNIT/ML SOPN FlexTouch Pen Inject 40 Units into the skin daily.  2   triamcinolone lotion (KENALOG) 0.1 % Apply 1 application topically 3 (three) times daily. 120 mL 0   benzonatate (TESSALON) 200 MG capsule TAKE 1 CAPSULE (200 MG TOTAL) BY MOUTH 3 (THREE) TIMES DAILY AS  NEEDED FOR COUGH. 60 capsule 0   hydrochlorothiazide (HYDRODIURIL) 25 MG tablet TAKE 1 TABLET (25 MG TOTAL) BY MOUTH DAILY. 90 tablet 1   pravastatin (PRAVACHOL) 80 MG tablet TAKE 1 TABLET(80 MG) BY MOUTH ONCE DAILY 90 tablet 1  amLODipine (NORVASC) 5 MG tablet TAKE 1 TABLET BY MOUTH EVERY DAY (Patient not taking: Reported on 06/23/2021) 90 tablet 1   No facility-administered medications prior to visit.    No Known Allergies  ROS See HPI    Objective:    Physical Exam Constitutional:      General: He is not in acute distress.    Appearance: Normal appearance. He is not ill-appearing.  HENT:     Head: Normocephalic and atraumatic.     Right Ear: External ear normal.     Left Ear: External ear normal.  Eyes:     Extraocular Movements: Extraocular movements intact.     Pupils: Pupils are equal, round, and reactive to light.  Cardiovascular:     Rate and Rhythm: Normal rate and regular rhythm.     Pulses: Normal pulses.     Heart sounds: Murmur heard.  Systolic murmur is present with a grade of 2/6.  Pulmonary:     Effort: Pulmonary effort is normal. No respiratory distress.     Breath sounds: Normal breath sounds. No wheezing or rales.  Skin:    General: Skin is warm and dry.     Comments: Corn noted on right small toe laterally  Neurological:     Mental Status: He is alert and oriented to person, place, and time.  Psychiatric:        Judgment: Judgment normal.    BP (!) 135/58 (BP Location: Right Arm, Patient Position: Sitting, Cuff Size: Large)    Pulse (!) 58    Temp 98.1 F (36.7 C) (Oral)    Resp 16    Wt 225 lb (102.1 kg)    SpO2 98%    BMI 38.62 kg/m  Wt Readings from Last 3 Encounters:  06/23/21 225 lb (102.1 kg)  01/28/21 227 lb 9.6 oz (103.2 kg)  12/23/20 227 lb (103 kg)    Diabetic Foot Exam - Simple   No data filed    Lab Results  Component Value Date   WBC 4.4 09/19/2020   HGB 13.0 09/19/2020   HCT 38.3 (L) 09/19/2020   PLT 219.0 09/19/2020    GLUCOSE 116 (H) 12/23/2020   CHOL 156 09/19/2020   TRIG 176.0 (H) 09/19/2020   HDL 43.00 09/19/2020   LDLCALC 78 09/19/2020   ALT 18 09/19/2020   AST 15 09/19/2020   NA 140 12/23/2020   K 4.2 12/23/2020   CL 101 12/23/2020   CREATININE 1.45 12/23/2020   BUN 31 (H) 12/23/2020   CO2 26 12/23/2020   TSH 0.91 02/18/2017   PSA 2.16 02/18/2017   HGBA1C 6.9 07/24/2020   MICROALBUR <0.7 07/30/2015    Lab Results  Component Value Date   TSH 0.91 02/18/2017   Lab Results  Component Value Date   WBC 4.4 09/19/2020   HGB 13.0 09/19/2020   HCT 38.3 (L) 09/19/2020   MCV 82.5 09/19/2020   PLT 219.0 09/19/2020   Lab Results  Component Value Date   NA 140 12/23/2020   K 4.2 12/23/2020   CO2 26 12/23/2020   GLUCOSE 116 (H) 12/23/2020   BUN 31 (H) 12/23/2020   CREATININE 1.45 12/23/2020   BILITOT 0.5 09/19/2020   ALKPHOS 59 09/19/2020   AST 15 09/19/2020   ALT 18 09/19/2020   PROT 6.0 09/19/2020   ALBUMIN 4.0 09/19/2020   CALCIUM 9.4 12/23/2020   ANIONGAP 12 08/22/2019   GFR 49.03 (L) 12/23/2020   Lab Results  Component Value Date  CHOL 156 09/19/2020   Lab Results  Component Value Date   HDL 43.00 09/19/2020   Lab Results  Component Value Date   LDLCALC 78 09/19/2020   Lab Results  Component Value Date   TRIG 176.0 (H) 09/19/2020   Lab Results  Component Value Date   CHOLHDL 4 09/19/2020   Lab Results  Component Value Date   HGBA1C 6.9 07/24/2020       Assessment & Plan:   Problem List Items Addressed This Visit       Unprioritized   Obesity    Wt Readings from Last 3 Encounters:  06/23/21 225 lb (102.1 kg)  01/28/21 227 lb 9.6 oz (103.2 kg)  12/23/20 227 lb (103 kg)        Hyperlipidemia - Primary    Lab Results  Component Value Date   CHOL 156 09/19/2020   HDL 43.00 09/19/2020   LDLCALC 78 09/19/2020   TRIG 176.0 (H) 09/19/2020   CHOLHDL 4 09/19/2020  Tolerating statin. Obtain follow up lipid panel.       Relevant Medications    hydrochlorothiazide (HYDRODIURIL) 25 MG tablet   pravastatin (PRAVACHOL) 80 MG tablet   Other Relevant Orders   Lipid panel   Essential hypertension    BP Readings from Last 3 Encounters:  06/23/21 (!) 135/58  02/10/21 112/62  01/28/21 (!) 113/52  States that he stopped amlodipine. BP is acceptable for his age. Continue clonidine, hctz, and lisinopril. OK to remain off of amlodipine.       Relevant Medications   hydrochlorothiazide (HYDRODIURIL) 25 MG tablet   pravastatin (PRAVACHOL) 80 MG tablet   Other Relevant Orders   Comp Met (CMET)   Diabetes type 2, controlled (HCC)    Last A1C 6.8 at endo. Has follow up scheduled.       Relevant Medications   pravastatin (PRAVACHOL) 80 MG tablet   Corn    Recommended that he apply otc corn pads. If no improvement, let me know and I will refer to podiatry.          Meds ordered this encounter  Medications   hydrochlorothiazide (HYDRODIURIL) 25 MG tablet    Sig: Take 1 tablet (25 mg total) by mouth daily.    Dispense:  90 tablet    Refill:  1    Order Specific Question:   Supervising Provider    Answer:   Penni Homans A [[id number]]   pravastatin (PRAVACHOL) 80 MG tablet    Sig: TAKE 1 TABLET(80 MG) BY MOUTH ONCE DAILY    Dispense:  90 tablet    Refill:  1    Order Specific Question:   Supervising Provider    Answer:   Penni Homans A [[id number]]    I, Nance Pear, NP, personally preformed the services described in this documentation.  All medical record entries made by the scribe were at my direction and in my presence.  I have reviewed the chart and discharge instructions (if applicable) and agree that the record reflects my personal performance and is accurate and complete. 06/23/2021   I,Keith Hardin,acting as a scribe for Nance Pear, NP.,have documented all relevant documentation on the behalf of Nance Pear, NP,as directed by  Nance Pear, NP while in the presence of Nance Pear,  NP.    Nance Pear, NP

## 2021-06-23 NOTE — Assessment & Plan Note (Signed)
Wt Readings from Last 3 Encounters:  06/23/21 225 lb (102.1 kg)  01/28/21 227 lb 9.6 oz (103.2 kg)  12/23/20 227 lb (103 kg)

## 2021-06-23 NOTE — Assessment & Plan Note (Addendum)
BP Readings from Last 3 Encounters:  06/23/21 (!) 135/58  02/10/21 112/62  01/28/21 (!) 113/52   States that he stopped amlodipine. BP is acceptable for his age. Continue clonidine, hctz, and lisinopril. OK to remain off of amlodipine.

## 2021-06-24 LAB — LIPID PANEL
Cholesterol: 148 mg/dL (ref 0–200)
HDL: 42.1 mg/dL (ref >39.00)
LDL Cholesterol: 67 mg/dL (ref 0–99)
NonHDL: 106.01
Total CHOL/HDL Ratio: 4
Triglycerides: 197 mg/dL — ABNORMAL HIGH (ref 0.0–149.0)
VLDL: 39.4 mg/dL (ref 0.0–40.0)

## 2021-06-24 LAB — COMPREHENSIVE METABOLIC PANEL
ALT: 18 U/L (ref 0–53)
AST: 15 U/L (ref 0–37)
Albumin: 4.2 g/dL (ref 3.5–5.2)
Alkaline Phosphatase: 59 U/L (ref 39–117)
BUN: 24 mg/dL — ABNORMAL HIGH (ref 6–23)
CO2: 31 mEq/L (ref 19–32)
Calcium: 9.3 mg/dL (ref 8.4–10.5)
Chloride: 102 mEq/L (ref 96–112)
Creatinine, Ser: 1.21 mg/dL (ref 0.40–1.50)
GFR: 60.7 mL/min (ref >60.00)
Glucose, Bld: 94 mg/dL (ref 70–99)
Potassium: 3.8 mEq/L (ref 3.5–5.1)
Sodium: 142 mEq/L (ref 135–145)
Total Bilirubin: 0.4 mg/dL (ref 0.2–1.2)
Total Protein: 6.4 g/dL (ref 6.0–8.3)

## 2021-07-05 ENCOUNTER — Other Ambulatory Visit: Payer: Self-pay | Admitting: Family

## 2021-07-05 DIAGNOSIS — R051 Acute cough: Secondary | ICD-10-CM

## 2021-09-04 ENCOUNTER — Other Ambulatory Visit: Payer: Self-pay | Admitting: Family

## 2021-09-10 ENCOUNTER — Encounter: Payer: Self-pay | Admitting: Gastroenterology

## 2021-12-10 ENCOUNTER — Other Ambulatory Visit: Payer: Self-pay | Admitting: Family

## 2021-12-22 ENCOUNTER — Ambulatory Visit (INDEPENDENT_AMBULATORY_CARE_PROVIDER_SITE_OTHER): Payer: BC Managed Care – PPO | Admitting: Family

## 2021-12-22 ENCOUNTER — Encounter: Payer: Self-pay | Admitting: Family

## 2021-12-22 ENCOUNTER — Telehealth: Payer: Self-pay | Admitting: Family

## 2021-12-22 VITALS — BP 111/57 | HR 50 | Temp 97.8°F | Resp 16 | Ht 64.5 in | Wt 222.0 lb

## 2021-12-22 DIAGNOSIS — E119 Type 2 diabetes mellitus without complications: Secondary | ICD-10-CM | POA: Diagnosis not present

## 2021-12-22 DIAGNOSIS — E7800 Pure hypercholesterolemia, unspecified: Secondary | ICD-10-CM

## 2021-12-22 DIAGNOSIS — R0989 Other specified symptoms and signs involving the circulatory and respiratory systems: Secondary | ICD-10-CM

## 2021-12-22 DIAGNOSIS — D509 Iron deficiency anemia, unspecified: Secondary | ICD-10-CM

## 2021-12-22 DIAGNOSIS — Z125 Encounter for screening for malignant neoplasm of prostate: Secondary | ICD-10-CM

## 2021-12-22 DIAGNOSIS — Z0001 Encounter for general adult medical examination with abnormal findings: Secondary | ICD-10-CM

## 2021-12-22 DIAGNOSIS — Z0000 Encounter for general adult medical examination without abnormal findings: Secondary | ICD-10-CM

## 2021-12-22 LAB — COMPREHENSIVE METABOLIC PANEL
ALT: 20 U/L (ref 0–53)
AST: 17 U/L (ref 0–37)
Albumin: 4.2 g/dL (ref 3.5–5.2)
Alkaline Phosphatase: 55 U/L (ref 39–117)
BUN: 23 mg/dL (ref 6–23)
CO2: 28 mEq/L (ref 19–32)
Calcium: 8.9 mg/dL (ref 8.4–10.5)
Chloride: 104 mEq/L (ref 96–112)
Creatinine, Ser: 1.17 mg/dL (ref 0.40–1.50)
GFR: 62.98 mL/min (ref >60.00)
Glucose, Bld: 107 mg/dL — ABNORMAL HIGH (ref 70–99)
Potassium: 3.8 mEq/L (ref 3.5–5.1)
Sodium: 144 mEq/L (ref 135–145)
Total Bilirubin: 0.4 mg/dL (ref 0.2–1.2)
Total Protein: 6.6 g/dL (ref 6.0–8.3)

## 2021-12-22 LAB — PSA: PSA: 2.5 ng/mL (ref 0.10–4.00)

## 2021-12-22 LAB — CBC WITH DIFFERENTIAL/PLATELET
Basophils Absolute: 0 10*3/uL (ref 0.0–0.1)
Basophils Relative: 0.4 % (ref 0.0–3.0)
Eosinophils Absolute: 0.2 10*3/uL (ref 0.0–0.7)
Eosinophils Relative: 3.6 % (ref 0.0–5.0)
HCT: 39.3 % (ref 39.0–52.0)
Hemoglobin: 13 g/dL (ref 13.0–17.0)
Lymphocytes Relative: 34.1 % (ref 12.0–46.0)
Lymphs Abs: 1.5 10*3/uL (ref 0.7–4.0)
MCHC: 33.2 g/dL (ref 30.0–36.0)
MCV: 83.5 fl (ref 78.0–100.0)
Monocytes Absolute: 0.3 10*3/uL (ref 0.1–1.0)
Monocytes Relative: 6.2 % (ref 3.0–12.0)
Neutro Abs: 2.4 10*3/uL (ref 1.4–7.7)
Neutrophils Relative %: 55.7 % (ref 43.0–77.0)
Platelets: 241 10*3/uL (ref 150.0–400.0)
RBC: 4.71 Mil/uL (ref 4.22–5.81)
RDW: 15.4 % (ref 11.5–15.5)
WBC: 4.3 10*3/uL (ref 4.0–10.5)

## 2021-12-22 LAB — LIPID PANEL
Cholesterol: 162 mg/dL (ref 0–200)
HDL: 45.1 mg/dL (ref >39.00)
LDL Cholesterol: 81 mg/dL (ref 0–99)
NonHDL: 116.6
Total CHOL/HDL Ratio: 4
Triglycerides: 177 mg/dL — ABNORMAL HIGH (ref 0.0–149.0)
VLDL: 35.4 mg/dL (ref 0.0–40.0)

## 2021-12-22 LAB — CBC AND DIFFERENTIAL: Hemoglobin: 13.5 (ref 13.5–17.5)

## 2021-12-22 MED ORDER — CLONIDINE HCL 0.3 MG PO TABS
ORAL_TABLET | ORAL | 2 refills | Status: DC
Start: 2021-12-22 — End: 2022-06-22

## 2021-12-22 MED ORDER — FUROSEMIDE 20 MG PO TABS
20.0000 mg | ORAL_TABLET | ORAL | 1 refills | Status: DC | PRN
Start: 2021-12-22 — End: 2022-06-22

## 2021-12-22 MED ORDER — LISINOPRIL 40 MG PO TABS: 40.0000 mg | ORAL_TABLET | Freq: Every day | ORAL | 1 refills | Status: DC

## 2021-12-22 NOTE — Telephone Encounter (Signed)
Can you please abstract A1C from Endocrinology in care everywhere?

## 2021-12-22 NOTE — Assessment & Plan Note (Addendum)
Continue work on [employer], exercise and weight loss.  Colo date has been pushed out to 2025 per new GI guidelines. Recommended flu shot an covid booster at the pharmacy this fall.

## 2021-12-22 NOTE — Assessment & Plan Note (Signed)
New. Will refer for ABI.

## 2021-12-22 NOTE — Assessment & Plan Note (Signed)
Maintained on pravachol. Check follow up lipid panel.

## 2021-12-22 NOTE — Assessment & Plan Note (Signed)
Continues iron supplement. Obtain iron studies/follow up cbc.

## 2021-12-22 NOTE — Assessment & Plan Note (Signed)
Last a1c was up slightly. This is being managed by Endocrinology.

## 2021-12-22 NOTE — Progress Notes (Signed)
Subjective:     Patient ID: Keith Hardin, male    DOB: 08-21-1950, 71 y.o.   MRN: [id number]  Chief Complaint  Patient presents with   Annual Exam         HPI Patient is in today for CPX.  Immunizations: recommended flu shot and covid booster in September Diet: healthy Exercise: working hard in the yard [employer] from Last 3 Encounters:  12/22/21 222 lb (100.7 kg)  06/23/21 225 lb (102.1 kg)  01/28/21 227 lb 9.6 oz (103.2 kg)  Colonoscopy: 2018, due 2025 per GI [facility]: scheduled for September Dental: up to date PSA:  Lab Results  Component Value Date   PSA 2.16 02/18/2017   PSA 3.02 10/04/2014   PSA 2.50 04/12/2014   A1C was 7.5 at endocrinology last visit.   HTN- on hctz, lisinopril, catapres.  BP Readings from Last 3 Encounters:  12/22/21 (!) 111/57  06/23/21 (!) 135/58  02/10/21 112/62     Health Maintenance Due  Topic Date Due   HEMOGLOBIN A1C  01/23/2021   COVID-19 Vaccine (6 - Pfizer series) 09/09/2021   INFLUENZA VACCINE  11/24/2021   OPHTHALMOLOGY EXAM  12/17/2021    Past Medical History:  Diagnosis Date   Allergy    Colon polyps    Diabetes mellitus    type II   GERD (gastroesophageal reflux disease)    Glaucoma    Heart murmur    Hyperlipidemia    Hypertension    Hypogonadism male 05/02/2011   Orchitis and epididymitis 01/2008   left with left hydrocele    Past Surgical History:  Procedure Laterality Date   ACHILLES TENDON REPAIR     right    Family History  Problem Relation Age of Onset   Seizures Mother    Hypertension Mother    Cancer Brother        stomach   Hypothyroidism Sister    Hypertension Brother    Colon cancer Neg Hx    Diabetes Neg Hx    Heart attack Neg Hx    Hyperlipidemia Neg Hx    Sudden death Neg Hx     Social History   Socioeconomic History   Marital status: Married    Spouse name: [name]   Number of children: 0   Years of education: Not on file   Highest education level: Not on file   Occupational History   Occupation: Merchandiser, retail: UNEMPLOYED  Tobacco Use   Smoking status: Never   Smokeless tobacco: Never  Vaping Use   Vaping Use: Never used  Substance and Sexual Activity   Alcohol use: No   Drug use: No   Sexual activity: Yes    Partners: Female  Other Topics Concern   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     augmented betamethasone dipropionate (DIPROLENE-AF) 0.05 % cream APPLY TOPICALLY TWICE A DAY 50 g  0   azelastine (ASTELIN) 0.1 % nasal spray INSTILL 2 SPRAYS INTO BOTH NOSTRILS AT BEDTIME AS NEEDED FOR RHINITIS AS DIRECTED 90 mL 1   BD PEN NEEDLE NANO 2ND GEN 32G X 4 MM MISC USE AS DIRECTED TO INJECT INSULIN 100 each 4   brimonidine (ALPHAGAN) 0.2 % ophthalmic solution INSTILL 1 DROP INTO AFFECTED EYE(S) BY OPHTHALMIC ROUTE EVERY 8 HOURS  6   dapagliflozin propanediol (FARXIGA) 10 MG TABS tablet Take 10 mg by mouth daily.     dorzolamide-timolol (COSOPT) 22.3-6.8 MG/ML ophthalmic solution 1 drop 2 (two) times daily.     Ferrous Sulfate (IRON) 325 (65 Fe) MG TABS Take 1 tablet by mouth daily. 30 each 0   fexofenadine (ALLEGRA) 180 MG tablet Take 180 mg by mouth daily.     fluticasone (FLONASE) 50 MCG/ACT nasal spray Place 2 sprays into both nostrils daily.     glimepiride (AMARYL) 4 MG tablet Take by mouth.     glucose blood (ACCU-CHEK AVIVA) test strip Use to test blood sugar three times daily. 50 each 1   hydrochlorothiazide  (HYDRODIURIL) 25 MG tablet Take 1 tablet (25 mg total) by mouth daily. 90 tablet 1   ketorolac (ACULAR) 0.5 % ophthalmic solution INSTILL 1 DROP INTO AFFECTED EYE(S) 4 TIMES DAILY  1   KLOR-CON M20 20 MEQ tablet TAKE 1 TABLET BY MOUTH TWICE A DAY 180 tablet 2   Lancets (ACCU-CHEK MULTICLIX) lancets Use as directed 3 times daily to check blood sugar 102 each 2   metFORMIN (GLUCOPHAGE) 1000 MG tablet Take 1,000 mg by mouth 2 (two) times daily with a meal.     miconazole (MICOTIN) 2 % cream Apply 1 application topically 2 (two) times daily as needed. 28.35 g 0   Omega-3 Fatty Acids (FISH OIL) 1000 MG CAPS Take 1 capsule by mouth 2 (two) times daily.     Omeprazole-Sodium Bicarbonate (ZEGERID OTC) 20-1100 MG CAPS capsule TAKE ONE CAPSULE BY MOUTH EVERY DAY BEFORE BREAKFAST 28 capsule 5   OVER THE COUNTER MEDICATION NATURE'S FORMULA HEALING HERBS TEA for healthy living.  1/2 teaspoon daily in water.     OVER THE COUNTER MEDICATION NATURE'S FORMULA.  CLEANSING HERBS for healthy living.  1/2 teaspoon daily in water.     pioglitazone (ACTOS) 30 MG tablet TAKE 1 TABLET(30 MG) BY MOUTH DAILY 90 tablet 0   pravastatin (PRAVACHOL) 80 MG tablet TAKE 1 TABLET(80 MG) BY MOUTH ONCE DAILY 90 tablet 0   TRESIBA FLEXTOUCH 100 UNIT/ML SOPN FlexTouch Pen Inject 40 Units into the skin daily.  2   triamcinolone lotion (KENALOG) 0.1 % Apply 1 application topically 3 (three) times daily. 120 mL 0   cloNIDine (CATAPRES) 0.3 MG tablet TAKE 1 TABLET(0.3 MG) BY MOUTH TWICE DAILY 180 tablet 2   COVID-19 mRNA bivalent vaccine, Pfizer, (PFIZER COVID-19 VAC BIVALENT) injection Inject into the muscle. 0.3 mL 0   furosemide (LASIX) 20 MG tablet TAKE 1 TABLET (20 MG TOTAL) BY MOUTH AS NEEDED. FOR SWELLING 90 tablet 1   lisinopril (ZESTRIL) 40 MG tablet TAKE 1 TABLET BY MOUTH EVERY DAY 90 tablet 1   Semaglutide (RYBELSUS) 14 MG TABS Take by mouth.     Semaglutide (RYBELSUS) 7 MG TABS Take 1 tablet by mouth daily.     No  facility-administered medications prior to visit.    No Known Allergies  Review of Systems  Constitutional:  Positive for weight loss.  HENT:  Negative for congestion and hearing loss.  Eyes:  Negative for blurred vision.  Respiratory:  Negative for cough.   Cardiovascular:  Negative for leg swelling.  Gastrointestinal:  Negative for constipation and diarrhea.  Genitourinary:  Negative for dysuria and frequency.  Musculoskeletal:  Negative for joint pain and myalgias.  Skin:  Negative for rash.  Neurological:  Negative for headaches.  Psychiatric/Behavioral:         Denies depression/anxiety        Objective:    Physical Exam Constitutional:      General: He is not in acute distress.    Appearance: He is well-developed.  HENT:     Head: Normocephalic and atraumatic.     Right Ear: Tympanic membrane and ear canal normal.     Left Ear: Tympanic membrane and ear canal normal.  Neck:     Thyroid: No thyroid mass.  Cardiovascular:     Rate and Rhythm: Normal rate and regular rhythm.     Pulses:          Dorsalis pedis pulses are 1+ on the right side and 2+ on the left side.       Posterior tibial pulses are 1+ on the right side and 2+ on the left side.     Heart sounds: No murmur heard. Pulmonary:     Effort: Pulmonary effort is normal. No respiratory distress.     Breath sounds: Normal breath sounds. No wheezing or rales.  Abdominal:     General: Abdomen is protuberant. Bowel sounds are normal.     Palpations: Abdomen is soft.     Tenderness: There is no abdominal tenderness.  Musculoskeletal:     Cervical back: Neck supple.     Right lower leg: No edema.     Left lower leg: No edema.  Lymphadenopathy:     Cervical: No cervical adenopathy.  Skin:    General: Skin is warm and dry.  Neurological:     Mental Status: He is alert and oriented to person, place, and time.     Deep Tendon Reflexes:     Reflex Scores:      Patellar reflexes are 2+ on the right side and  2+ on the left side.    Comments: Bilateral UE/LE strength is 5/5  Psychiatric:        Behavior: Behavior normal.        Thought Content: Thought content normal.     BP (!) 111/57 (BP Location: Right Arm, Patient Position: Sitting, Cuff Size: Small)   Pulse (!) 50   Temp 97.8 F (36.6 C) (Oral)   Resp 16   Ht 5' 4.5" (1.638 m)   Wt 222 lb (100.7 kg)   SpO2 98%   BMI 37.52 kg/m  Wt Readings from Last 3 Encounters:  12/22/21 222 lb (100.7 kg)  06/23/21 225 lb (102.1 kg)  01/28/21 227 lb 9.6 oz (103.2 kg)       Assessment & Plan:   Problem List Items Addressed This Visit       Unprioritized   Preventative health care - Primary    Continue work on healthy diet, exercise and weight loss.  Colo date has been pushed out to 2025 per new GI guidelines. Recommended flu shot an covid booster at the pharmacy this fall.       Hyperlipidemia    Maintained on pravachol. Check follow up lipid panel.       Relevant Medications   lisinopril (ZESTRIL) 40 MG tablet   furosemide (LASIX) 20 MG  tablet   cloNIDine (CATAPRES) 0.3 MG tablet   Other Relevant Orders   Lipid panel   Comp Met (CMET)   Diabetes type 2, controlled (HCC)    Last a1c was up slightly. This is being managed by Endocrinology.       Relevant Medications   lisinopril (ZESTRIL) 40 MG tablet   Decreased pulse    New. Will refer for ABI.       Relevant Orders   VAS [place] ABI WITH/WO TBI   Anemia, iron deficiency    Continues iron supplement. Obtain iron studies/follow up cbc.       Relevant Orders   CBC with Differential/Platelet   Iron, TIBC and Ferritin Panel   Other Visit Diagnoses     Prostate cancer screening       Relevant Orders   PSA       I have discontinued Herbie Baltimore E. Diltz's Rybelsus, Rybelsus, and Pfizer COVID-19 Vac Bivalent. I have also changed his lisinopril. Additionally, I am having him maintain his aspirin, accu-chek multiclix, Fish Oil, fluticasone, fexofenadine, glucose blood,  dapagliflozin propanediol, Omeprazole-Sodium Bicarbonate, Iron, pioglitazone, brimonidine, ketorolac, OVER THE COUNTER MEDICATION, OVER THE COUNTER MEDICATION, Tyler Aas FlexTouch, azelastine, glimepiride, metFORMIN, dorzolamide-timolol, miconazole, triamcinolone lotion, augmented betamethasone dipropionate, BD Pen Needle Nano 2nd Gen, [place]-Con M20, hydrochlorothiazide, pravastatin, furosemide, and cloNIDine.  Meds ordered this encounter  Medications   lisinopril (ZESTRIL) 40 MG tablet    Sig: Take 1 tablet (40 mg total) by mouth daily.    Dispense:  90 tablet    Refill:  1    Order Specific Question:   Supervising Provider    Answer:   Penni Homans A [[id number]]   furosemide (LASIX) 20 MG tablet    Sig: Take 1 tablet (20 mg total) by mouth as needed. For swelling    Dispense:  90 tablet    Refill:  1    Order Specific Question:   Supervising Provider    Answer:   Penni Homans A [[id number]]   cloNIDine (CATAPRES) 0.3 MG tablet    Sig: TAKE 1 TABLET(0.3 MG) BY MOUTH TWICE DAILY    Dispense:  180 tablet    Refill:  2    Order Specific Question:   Supervising Provider    Answer:   Penni Homans A [[id number]]

## 2021-12-23 LAB — IRON,TIBC AND FERRITIN PANEL
%SAT: 21 % (calc) (ref 20–48)
Ferritin: 48 ng/mL (ref 24–380)
Iron: 77 ug/dL (ref 50–180)
TIBC: 363 mcg/dL (calc) (ref 250–425)

## 2021-12-23 NOTE — Telephone Encounter (Signed)
Information entered

## 2022-01-01 ENCOUNTER — Telehealth: Payer: Self-pay | Admitting: Family

## 2022-01-01 NOTE — Telephone Encounter (Signed)
Pt is needing labs printed from 8/29, and he would like a call when they are ready to be picked up.

## 2022-01-01 NOTE — Telephone Encounter (Signed)
Patient's wife advised copy left upfront for patient to pick up

## 2022-01-05 ENCOUNTER — Telehealth: Payer: Self-pay | Admitting: Family

## 2022-01-05 NOTE — Telephone Encounter (Signed)
Patient advised the call he received was from the heart and vascular department to schedule ul

## 2022-01-05 NOTE — Telephone Encounter (Signed)
Pt tasted he thought he got a call about scheduling a test for circulation in his leg. Please advise.

## 2022-01-20 LAB — HM DIABETES EYE EXAM

## 2022-01-29 ENCOUNTER — Ambulatory Visit (HOSPITAL_COMMUNITY)
Admission: RE | Admit: 2022-01-29 | Discharge: 2022-01-29 | Disposition: A | Discharge status: Home / Self Care | Payer: BC Managed Care – PPO | Source: Ambulatory Visit | Attending: Family | Admitting: Family

## 2022-01-29 DIAGNOSIS — R0989 Other specified symptoms and signs involving the circulatory and respiratory systems: Secondary | ICD-10-CM | POA: Insufficient documentation

## 2022-01-29 NOTE — Progress Notes (Signed)
ABI has been completed.   Preliminary results in CV Proc.   Jinny Blossom Huston Stonehocker 01/29/2022 4:13 PM

## 2022-02-01 ENCOUNTER — Telehealth: Payer: Self-pay | Admitting: Family

## 2022-02-01 NOTE — Telephone Encounter (Signed)
See mychart.  

## 2022-02-08 ENCOUNTER — Encounter: Payer: Self-pay | Admitting: Family Medicine

## 2022-02-08 ENCOUNTER — Ambulatory Visit (INDEPENDENT_AMBULATORY_CARE_PROVIDER_SITE_OTHER): Payer: BC Managed Care – PPO | Admitting: Family Medicine

## 2022-02-08 VITALS — BP 121/78 | HR 64 | Temp 98.1°F | Ht 64.0 in | Wt 219.2 lb

## 2022-02-08 DIAGNOSIS — J0100 Acute maxillary sinusitis, unspecified: Secondary | ICD-10-CM | POA: Diagnosis not present

## 2022-02-08 MED ORDER — FLUTICASONE PROPIONATE 50 MCG/ACT NA SUSP: 2.0000 | Freq: Every day | NASAL | 2 refills | Status: DC

## 2022-02-08 MED ORDER — AMOXICILLIN-POT CLAVULANATE 875-125 MG PO TABS: 1.0000 | ORAL_TABLET | Freq: Two times a day (BID) | ORAL | 0 refills | Status: AC

## 2022-02-08 NOTE — Progress Notes (Signed)
Chief Complaint  Patient presents with   Sinusitis   Facial Pain    Headache     [name] here for URI complaints.  Duration: 8 days  Associated symptoms: sinus congestion, sinus pain, rhinorrhea, and sore throat Denies: itchy watery eyes, ear pain, ear drainage, wheezing, shortness of breath, myalgia, and coughing, dental pain, N/V Treatment to date: Coricidin Sick contacts: No  Past Medical History:  Diagnosis Date   Allergy    Colon polyps    Diabetes mellitus    type II   GERD (gastroesophageal reflux disease)    Glaucoma    Heart murmur    Hyperlipidemia    Hypertension    Hypogonadism male 05/02/2011   Orchitis and epididymitis 01/2008   left with left hydrocele    Objective BP 121/78 (BP Location: Left Arm, Patient Position: Sitting, Cuff Size: Large)   Pulse 64   Temp 98.1 F (36.7 C) (Oral)   Ht '5\' 4"'$  (1.626 m)   Wt 219 lb 4 oz (99.5 kg)   SpO2 98%   BMI 37.63 kg/m  General: Awake, alert, appears stated age HEENT: AT, [place], ears patent b/l and TM's neg, nares patent w/o discharge, pharynx pink and without exudates, MMM, very ttp over the L maxillary sinus Neck: No masses or asymmetry Heart: RRR Lungs: CTAB, no accessory muscle use Psych: Age appropriate judgment and insight, normal mood and affect  Acute maxillary sinusitis, recurrence not specified - Plan: fluticasone (FLONASE) 50 MCG/ACT nasal spray, amoxicillin-clavulanate (AUGMENTIN) 875-125 MG tablet  3 d of INCS. If no better, will take 7 d course of Augmentin bid. Continue to push fluids, practice good hand hygiene, cover mouth when coughing. F/u prn. If starting to experience fevers, shaking, or shortness of breath, seek immediate care. Pt voiced understanding and agreement to the plan.  [place], DO 02/08/22 3:35 PM

## 2022-02-08 NOTE — Patient Instructions (Addendum)
Continue to push fluids, practice good hand hygiene, and cover your mouth if you cough.  If you start having fevers, shaking or shortness of breath, seek immediate care.  OK to take Tylenol 1000 mg (2 extra strength tabs) or 975 mg (3 regular strength tabs) every 6 hours as needed.  If no better in 3 days, take the antibiotic.  The Flonase is also available over the counter.   Ice/cold pack over area for 10-15 min twice daily.  Let us know if you need anything.

## 2022-03-06 ENCOUNTER — Other Ambulatory Visit: Payer: Self-pay | Admitting: Family

## 2022-03-12 ENCOUNTER — Other Ambulatory Visit: Payer: Self-pay | Admitting: Family

## 2022-03-23 ENCOUNTER — Other Ambulatory Visit: Payer: Self-pay | Admitting: Family

## 2022-03-23 DIAGNOSIS — I10 Essential (primary) hypertension: Secondary | ICD-10-CM

## 2022-06-11 ENCOUNTER — Other Ambulatory Visit: Payer: Self-pay

## 2022-06-11 MED ORDER — BD PEN NEEDLE NANO 2ND GEN 32G X 4 MM MISC: 4 refills | Status: AC

## 2022-06-21 NOTE — Progress Notes (Signed)
Subjective:   By signing my name below, I, Keith Hardin, attest that this documentation has been prepared under the direction and in the presence of Nance Pear, NP 06/22/22   Patient ID: Keith Hardin, male    DOB: 1950-08-01, 72 y.o.   MRN: [id number]  Chief Complaint  Patient presents with   Hypertension    Here for follow up   Diabetes    Here for follow up    HPI Patient is in today for a 6 month follow up.   Endocrinology: He confirms he follows up with Endocrinology at [place]: He is UTD on vision care.   Blood pressure: He is compliant with Lisinopril 40 mg daily, Clonidine 0.3 mg twice daily, and Hydrochlorothiazide 25 mg daily.   Reflux: He is compliant with OTC Zegerid and notes he has no issues.   Anemia: He has been managing his anemia. He is compliant with his iron supplements.   Work: He reports he is still working.   Past Medical History:  Diagnosis Date   Allergy    Colon polyps    Diabetes mellitus    type II   GERD (gastroesophageal reflux disease)    Glaucoma    Heart murmur    Hyperlipidemia    Hypertension    Hypogonadism male 05/02/2011   Orchitis and epididymitis 01/2008   left with left hydrocele    Past Surgical History:  Procedure Laterality Date   ACHILLES TENDON REPAIR     right    Family History  Problem Relation Age of Onset   Seizures Mother    Hypertension Mother    Cancer Brother        stomach   Hypothyroidism Sister    Hypertension Brother    Colon cancer Neg Hx    Diabetes Neg Hx    Heart attack Neg Hx    Hyperlipidemia Neg Hx    Sudden death Neg Hx     Social History   Socioeconomic History   Marital status: Married    Spouse name: Keith Hardin   Number of children: 0   Years of education: Not on file   Highest education level: Not on file  Occupational History   Occupation: Merchandiser, retail: UNEMPLOYED  Tobacco Use   Smoking status: Never   Smokeless tobacco: Never  Vaping  Use   Vaping Use: Never used  Substance and Sexual Activity   Alcohol use: No   Drug use: No   Sexual activity: Yes    Partners: Female  Other Topics Concern   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     augmented betamethasone dipropionate (DIPROLENE-AF) 0.05 % cream APPLY TOPICALLY TWICE A DAY 50 g 0   brimonidine (ALPHAGAN) 0.2 % ophthalmic solution INSTILL 1 DROP INTO AFFECTED EYE(S) BY OPHTHALMIC ROUTE EVERY 8 HOURS  6   dapagliflozin propanediol (FARXIGA)  10 MG TABS tablet Take 10 mg by mouth daily.     dorzolamide-timolol (COSOPT) 22.3-6.8 MG/ML ophthalmic solution 1 drop 2 (two) times daily.     Ferrous Sulfate (IRON) 325 (65 Fe) MG TABS Take 1 tablet by mouth daily. 30 each 0   fexofenadine (ALLEGRA) 180 MG tablet Take 180 mg by mouth daily.     glimepiride (AMARYL) 4 MG tablet Take by mouth.     glucose blood (ACCU-CHEK AVIVA) test strip Use to test blood sugar three times daily. 50 each 1   Insulin Pen Needle (BD PEN NEEDLE NANO 2ND GEN) 32G X 4 MM MISC USE AS DIRECTED TO INJECT INSULIN 100 each 4   Lancets (ACCU-CHEK MULTICLIX) lancets Use as directed 3 times daily to check blood sugar 102 each 2   metFORMIN (GLUCOPHAGE) 1000 MG tablet Take 1,000 mg by mouth 2 (two) times daily with a meal.     Omega-3 Fatty Acids (FISH OIL) 1000 MG CAPS Take 1 capsule by mouth 2 (two) times daily.     Omeprazole-Sodium Bicarbonate (ZEGERID OTC) 20-1100 MG CAPS capsule TAKE ONE CAPSULE BY  MOUTH EVERY DAY BEFORE BREAKFAST 28 capsule 5   pioglitazone (ACTOS) 30 MG tablet TAKE 1 TABLET(30 MG) BY MOUTH DAILY 90 tablet 0   TRESIBA FLEXTOUCH 100 UNIT/ML SOPN FlexTouch Pen Inject 40 Units into the skin daily.  2   cloNIDine (CATAPRES) 0.3 MG tablet TAKE 1 TABLET(0.3 MG) BY MOUTH TWICE DAILY 180 tablet 2   furosemide (LASIX) 20 MG tablet Take 1 tablet (20 mg total) by mouth as needed. For swelling 90 tablet 1   hydrochlorothiazide (HYDRODIURIL) 25 MG tablet TAKE 1 TABLET (25 MG TOTAL) BY MOUTH DAILY. 90 tablet 1   lisinopril (ZESTRIL) 40 MG tablet Take 1 tablet (40 mg total) by mouth daily. 90 tablet 1   potassium chloride SA (KLOR-CON M20) 20 MEQ tablet TAKE 1 TABLET BY MOUTH TWICE A DAY 180 tablet 1   pravastatin (PRAVACHOL) 80 MG tablet TAKE 1 TABLET(80 MG) BY MOUTH ONCE DAILY 90 tablet 0   fluticasone (FLONASE) 50 MCG/ACT nasal spray Place 2 sprays into both nostrils daily. 16 g 2   ketorolac (ACULAR) 0.5 % ophthalmic solution INSTILL 1 DROP INTO AFFECTED EYE(S) 4 TIMES DAILY  1   miconazole (MICOTIN) 2 % cream Apply 1 application topically 2 (two) times daily as needed. 28.35 g 0   OVER THE COUNTER MEDICATION NATURE'S FORMULA HEALING HERBS TEA for healthy living.  1/2 teaspoon daily in water.     OVER THE COUNTER MEDICATION NATURE'S FORMULA.  CLEANSING HERBS for healthy living.  1/2 teaspoon daily in water.     triamcinolone lotion (KENALOG) 0.1 % Apply 1 application topically 3 (three) times daily. 120 mL 0   No facility-administered medications prior to visit.    No Known Allergies  ROS See HPI    Objective:    Physical Exam Constitutional:      Appearance: Normal appearance.  HENT:     Head: Normocephalic and atraumatic.     Right Ear: External ear normal.     Left Ear: External ear normal.  Eyes:     Extraocular Movements: Extraocular movements intact.     Conjunctiva/sclera: Conjunctivae normal.  Cardiovascular:     Rate and Rhythm: Normal rate and regular  rhythm.     Heart sounds: Murmur heard.     Systolic murmur is present with a grade of 2/6.  Pulmonary:     Effort: Pulmonary effort  is normal.     Breath sounds: Normal breath sounds.  Musculoskeletal:        General: No swelling.  Skin:    General: Skin is dry.  Neurological:     General: No focal deficit present.     Mental Status: He is alert and oriented to person, place, and time.     BP 134/67 (BP Location: Right Arm, Patient Position: Sitting, Cuff Size: Large)   Pulse 61   Temp (!) 97.5 F (36.4 C) (Oral)   Resp 16   Wt 217 lb (98.4 kg)   SpO2 98%   BMI 37.25 kg/m  Wt Readings from Last 3 Encounters:  06/22/22 217 lb (98.4 kg)  02/08/22 219 lb 4 oz (99.5 kg)  12/22/21 222 lb (100.7 kg)       Assessment & Plan:  Essential hypertension Assessment & Plan: BP Readings from Last 3 Encounters:  06/22/22 134/67  02/08/22 121/78  12/22/21 (!) 111/57   BP at goal on clonidine, lisinopril and hctz, continue same.   Orders: -     hydroCHLOROthiazide; Take 1 tablet (25 mg total) by mouth daily.  Dispense: 90 tablet; Refill: 1  Gastroesophageal reflux disease, unspecified whether esophagitis present Assessment & Plan: Reports symptoms are well controlled on Zegerid.  Continue same.    Iron deficiency anemia, unspecified iron deficiency anemia type Assessment & Plan: Lab Results  Component Value Date   WBC 4.3 12/22/2021   HGB 13.0 12/22/2021   HCT 39.3 12/22/2021   MCV 83.5 12/22/2021   PLT 241.0 12/22/2021   CBC, iron studies WNL last visit. Continue iron supplement.    Pure hypercholesterolemia Assessment & Plan: Lab Results  Component Value Date   CHOL 162 12/22/2021   HDL 45.10 12/22/2021   LDLCALC 81 12/22/2021   TRIG 177.0 (H) 12/22/2021   CHOLHDL 4 12/22/2021   Maintained on pravastatin. Continue same. Reinforced diet to lower A1C and triglycerides.     Screening for colon cancer -     Ambulatory referral to  Gastroenterology  Controlled type 2 diabetes mellitus without complication, without long-term current use of insulin [facility]) Assessment & Plan: Lab Results  Component Value Date   HGBA1C 6.9 07/24/2020   HGBA1C 704 12/21/2019   HGBA1C 7.0 06/12/2019   Lab Results  Component Value Date   MICROALBUR <0.7 07/30/2015   LDLCALC 81 12/22/2021   CREATININE 1.17 12/22/2021   Last A1C was 12/24/21 with endo at 7.2.  Defer med management per endo.    Orders: -     Microalbumin / creatinine urine ratio -     Basic metabolic panel  Mild aortic stenosis Assessment & Plan: Was stable on follow up echo 2021- clinically compensated.    Other orders -     Pravastatin Sodium; TAKE 1 TABLET(80 MG) BY MOUTH ONCE DAILY  Dispense: 90 tablet; Refill: 1 -     Potassium Chloride Crys ER; Take 1 tablet (20 mEq total) by mouth 2 (two) times daily.  Dispense: 180 tablet; Refill: 1 -     Lisinopril; Take 1 tablet (40 mg total) by mouth daily.  Dispense: 90 tablet; Refill: 1 -     Furosemide; Take 1 tablet (20 mg total) by mouth as needed. For swelling  Dispense: 90 tablet; Refill: 1 -     cloNIDine HCl; TAKE 1 TABLET(0.3 MG) BY MOUTH TWICE DAILY  Dispense: 180 tablet; Refill: 1     I,Rachel Rivera,acting as a scribe for [employer], NP.,have  documented all relevant documentation on the behalf of Nance Pear, NP,as directed by  Nance Pear, NP while in the presence of Nance Pear, NP.   I, Nance Pear, NP, personally preformed the services described in this documentation.  All medical record entries made by the scribe were at my direction and in my presence.  I have reviewed the chart and discharge instructions (if applicable) and agree that the record reflects my personal performance and is accurate and complete. 06/22/22   Nance Pear, NP

## 2022-06-22 ENCOUNTER — Ambulatory Visit: Payer: BC Managed Care – PPO | Admitting: Family

## 2022-06-22 ENCOUNTER — Telehealth: Payer: Self-pay | Admitting: Family

## 2022-06-22 ENCOUNTER — Encounter: Payer: Self-pay | Admitting: Family

## 2022-06-22 VITALS — BP 134/67 | HR 61 | Temp 97.5°F | Resp 16 | Wt 217.0 lb

## 2022-06-22 DIAGNOSIS — I350 Nonrheumatic aortic (valve) stenosis: Secondary | ICD-10-CM

## 2022-06-22 DIAGNOSIS — Z1211 Encounter for screening for malignant neoplasm of colon: Secondary | ICD-10-CM

## 2022-06-22 DIAGNOSIS — E119 Type 2 diabetes mellitus without complications: Secondary | ICD-10-CM

## 2022-06-22 DIAGNOSIS — E7800 Pure hypercholesterolemia, unspecified: Secondary | ICD-10-CM

## 2022-06-22 DIAGNOSIS — I10 Essential (primary) hypertension: Secondary | ICD-10-CM | POA: Diagnosis not present

## 2022-06-22 DIAGNOSIS — D509 Iron deficiency anemia, unspecified: Secondary | ICD-10-CM | POA: Diagnosis not present

## 2022-06-22 DIAGNOSIS — K219 Gastro-esophageal reflux disease without esophagitis: Secondary | ICD-10-CM | POA: Diagnosis not present

## 2022-06-22 MED ORDER — HYDROCHLOROTHIAZIDE 25 MG PO TABS: 25.0000 mg | ORAL_TABLET | Freq: Every day | ORAL | 1 refills | Status: DC

## 2022-06-22 MED ORDER — POTASSIUM CHLORIDE CRYS ER 20 MEQ PO TBCR: 20.0000 meq | EXTENDED_RELEASE_TABLET | Freq: Two times a day (BID) | ORAL | 1 refills | Status: DC

## 2022-06-22 MED ORDER — LISINOPRIL 40 MG PO TABS: 40.0000 mg | ORAL_TABLET | Freq: Every day | ORAL | 1 refills | Status: DC

## 2022-06-22 MED ORDER — FUROSEMIDE 20 MG PO TABS: 20.0000 mg | ORAL_TABLET | ORAL | 1 refills | Status: DC | PRN

## 2022-06-22 MED ORDER — PRAVASTATIN SODIUM 80 MG PO TABS: ORAL_TABLET | ORAL | 1 refills | Status: DC

## 2022-06-22 MED ORDER — CLONIDINE HCL 0.3 MG PO TABS: ORAL_TABLET | ORAL | 1 refills | Status: DC

## 2022-06-22 NOTE — Assessment & Plan Note (Addendum)
Lab Results  Component Value Date   HGBA1C 6.9 07/24/2020   HGBA1C 704 12/21/2019   HGBA1C 7.0 06/12/2019   Lab Results  Component Value Date   MICROALBUR <0.7 07/30/2015   LDLCALC 81 12/22/2021   CREATININE 1.17 12/22/2021   Last A1C was 12/24/21 with endo at 7.2.  Defer med management per endo.

## 2022-06-22 NOTE — Assessment & Plan Note (Signed)
Lab Results  Component Value Date   WBC 4.3 12/22/2021   HGB 13.0 12/22/2021   HCT 39.3 12/22/2021   MCV 83.5 12/22/2021   PLT 241.0 12/22/2021   CBC, iron studies WNL last visit. Continue iron supplement.

## 2022-06-22 NOTE — Telephone Encounter (Signed)
Records release faxed 

## 2022-06-22 NOTE — Assessment & Plan Note (Signed)
BP Readings from Last 3 Encounters:  06/22/22 134/67  02/08/22 121/78  12/22/21 (!) 111/57   BP at goal on clonidine, lisinopril and hctz, continue same.

## 2022-06-22 NOTE — Telephone Encounter (Signed)
Please call Dr. Philis Pique in [place] and request a copy DM eye.

## 2022-06-22 NOTE — Assessment & Plan Note (Signed)
Was stable on follow up echo 2021- clinically compensated.

## 2022-06-22 NOTE — Assessment & Plan Note (Signed)
Reports symptoms are well controlled on Zegerid.  Continue same.

## 2022-06-22 NOTE — Assessment & Plan Note (Addendum)
Lab Results  Component Value Date   CHOL 162 12/22/2021   HDL 45.10 12/22/2021   LDLCALC 81 12/22/2021   TRIG 177.0 (H) 12/22/2021   CHOLHDL 4 12/22/2021   Maintained on pravastatin. Continue same. Reinforced diet to lower A1C and triglycerides.

## 2022-06-23 LAB — BASIC METABOLIC PANEL
BUN: 21 mg/dL (ref 6–23)
CO2: 29 mEq/L (ref 19–32)
Calcium: 9.6 mg/dL (ref 8.4–10.5)
Chloride: 103 mEq/L (ref 96–112)
Creatinine, Ser: 1.06 mg/dL (ref 0.40–1.50)
GFR: 70.65 mL/min (ref >60.00)
Glucose, Bld: 103 mg/dL — ABNORMAL HIGH (ref 70–99)
Potassium: 3.9 mEq/L (ref 3.5–5.1)
Sodium: 145 mEq/L (ref 135–145)

## 2022-06-23 LAB — MICROALBUMIN / CREATININE URINE RATIO
Creatinine,U: 59.2 mg/dL
Microalb Creat Ratio: 1.2 mg/g (ref 0.0–30.0)
Microalb, Ur: <0.7 mg/dL (ref 0.0–1.9)

## 2022-09-18 ENCOUNTER — Other Ambulatory Visit: Payer: Self-pay | Admitting: Family Medicine

## 2022-09-18 DIAGNOSIS — J0100 Acute maxillary sinusitis, unspecified: Secondary | ICD-10-CM

## 2022-09-21 NOTE — Telephone Encounter (Signed)
Not on current list 

## 2022-09-30 ENCOUNTER — Other Ambulatory Visit: Payer: Self-pay | Admitting: Family

## 2022-11-22 ENCOUNTER — Ambulatory Visit: Payer: BC Managed Care – PPO | Admitting: Physician Assistant

## 2022-11-22 ENCOUNTER — Encounter: Payer: Self-pay | Admitting: Physician Assistant

## 2022-11-22 VITALS — BP 118/62 | HR 69 | Temp 98.0°F | Resp 20 | Ht 64.0 in | Wt 212.0 lb

## 2022-11-22 DIAGNOSIS — R600 Localized edema: Secondary | ICD-10-CM

## 2022-11-22 DIAGNOSIS — T63441A Toxic effect of venom of bees, accidental (unintentional), initial encounter: Secondary | ICD-10-CM

## 2022-11-22 DIAGNOSIS — H02846 Edema of left eye, unspecified eyelid: Secondary | ICD-10-CM

## 2022-11-22 MED ORDER — PREDNISONE 10 MG PO TABS: ORAL_TABLET | ORAL | 0 refills | Status: AC

## 2022-11-22 MED ORDER — HYDROXYZINE PAMOATE 25 MG PO CAPS: 25.0000 mg | ORAL_CAPSULE | Freq: Three times a day (TID) | ORAL | 0 refills | Status: DC | PRN

## 2022-11-22 NOTE — Progress Notes (Signed)
Established patient visit   Patient: Keith Hardin   DOB: [birth year]   72 y.o. Male  MRN: [id number] Visit Date: 11/22/2022  Today's healthcare provider: Alfredia Ferguson, PA-C   Chief Complaint  Patient presents with   Insect Bite    Bee sting to eye area left eye and left top lip with itch   Subjective    HPI  Pt reports a bee sting to his left eye, face, and lip x 2 days ago on Saturday. Reports swelling and itching started yesterday. Denies vision changes. Denies prior allergic reaction to bees.  Medications: Outpatient Medications Prior to Visit  Medication Sig   aspirin 81 MG tablet Take 81 mg by mouth daily.   augmented betamethasone dipropionate (DIPROLENE-AF) 0.05 % cream APPLY TOPICALLY TWICE A DAY   brimonidine (ALPHAGAN) 0.2 % ophthalmic solution INSTILL 1 DROP INTO AFFECTED EYE(S) BY OPHTHALMIC ROUTE EVERY 8 HOURS   cloNIDine (CATAPRES) 0.3 MG tablet TAKE 1 TABLET(0.3 MG) BY MOUTH TWICE DAILY   dapagliflozin propanediol (FARXIGA) 10 MG TABS tablet Take 10 mg by mouth daily.   dorzolamide-timolol (COSOPT) 22.3-6.8 MG/ML ophthalmic solution 1 drop 2 (two) times daily.   Ferrous Sulfate (IRON) 325 (65 Fe) MG TABS Take 1 tablet by mouth daily.   fexofenadine (ALLEGRA) 180 MG tablet Take 180 mg by mouth daily.   fluticasone (FLONASE) 50 MCG/ACT nasal spray SPRAY 2 SPRAYS INTO EACH NOSTRIL EVERY DAY   furosemide (LASIX) 20 MG tablet TAKE 1 TABLET (20 MG TOTAL) BY MOUTH AS NEEDED. FOR SWELLING   glimepiride (AMARYL) 4 MG tablet Take by mouth.   glucose blood (ACCU-CHEK AVIVA) test strip Use to test blood sugar three times daily.   hydrochlorothiazide (HYDRODIURIL) 25 MG tablet Take 1 tablet (25 mg total) by mouth daily.   Insulin Pen Needle (BD PEN NEEDLE NANO 2ND GEN) 32G X 4 MM MISC USE AS DIRECTED TO INJECT INSULIN   Lancets (ACCU-CHEK MULTICLIX) lancets Use as directed 3 times daily to check blood sugar   lisinopril (ZESTRIL) 40 MG tablet Take 1 tablet (40 mg  total) by mouth daily.   metFORMIN (GLUCOPHAGE) 1000 MG tablet Take 1,000 mg by mouth 2 (two) times daily with a meal.   Omega-3 Fatty Acids (FISH OIL) 1000 MG CAPS Take 1 capsule by mouth 2 (two) times daily.   Omeprazole-Sodium Bicarbonate (ZEGERID OTC) 20-1100 MG CAPS capsule TAKE ONE CAPSULE BY MOUTH EVERY DAY BEFORE BREAKFAST   pioglitazone (ACTOS) 30 MG tablet TAKE 1 TABLET(30 MG) BY MOUTH DAILY   potassium chloride SA (KLOR-CON M20) 20 MEQ tablet Take 1 tablet (20 mEq total) by mouth 2 (two) times daily.   pravastatin (PRAVACHOL) 80 MG tablet TAKE 1 TABLET(80 MG) BY MOUTH ONCE DAILY   TRESIBA FLEXTOUCH 100 UNIT/ML SOPN FlexTouch Pen Inject 40 Units into the skin daily.   No facility-administered medications prior to visit.    Review of Systems  Constitutional:  Negative for fatigue and fever.  Respiratory:  Negative for cough and shortness of breath.   Cardiovascular:  Negative for chest pain, palpitations and leg swelling.  Skin:        Swelling to face  Neurological:  Negative for dizziness and headaches.       Objective    BP 118/62 (BP Location: Left Arm, Patient Position: Sitting, Cuff Size: Normal)   Pulse 69   Temp 98 F (36.7 C) (Oral)   Resp 20   Ht 5\' 4"  (1.626 m)  Wt 212 lb (96.2 kg)   SpO2 95%   BMI 36.39 kg/m   Physical Exam Vitals reviewed.  Constitutional:      Appearance: He is not ill-appearing.  HENT:     Head: Normocephalic.     Comments: Left eyelid , cheek and upper left lip with moderate edema.  No erythema, warmth.  Pt able to open/close left eye Eyes:     Conjunctiva/sclera: Conjunctivae normal.  Cardiovascular:     Rate and Rhythm: Normal rate.  Pulmonary:     Effort: Pulmonary effort is normal. No respiratory distress.  Neurological:     General: No focal deficit present.     Mental Status: He is alert and oriented to person, place, and time.  Psychiatric:        Mood and Affect: Mood normal.        Behavior: Behavior normal.       No results found for any visits on 11/22/22.  Assessment & Plan     1. Edema of left eyelid 2. Lip edema  3. Bee sting, accidental or unintentional, initial encounter Rx prednisone, advised increase in sugars and to monitor .  Rx hydroxyzine for itching, cautioned drowsiness.  - predniSONE (DELTASONE) 10 MG tablet; Take 6 tablets (60 mg total) by mouth daily with breakfast for 1 day, THEN 5 tablets (50 mg total) daily with breakfast for 1 day, THEN 4 tablets (40 mg total) daily with breakfast for 1 day, THEN 3 tablets (30 mg total) daily with breakfast for 1 day, THEN 2 tablets (20 mg total) daily with breakfast for 1 day, THEN 1 tablet (10 mg total) daily with breakfast for 1 day.  Dispense: 21 tablet; Refill: 0 - hydrOXYzine (VISTARIL) 25 MG capsule; Take 1 capsule (25 mg total) by mouth every 8 (eight) hours as needed for itching.  Dispense: 30 capsule; Refill: 0   Return if symptoms worsen or fail to improve.     I, Alfredia Ferguson, PA-C have reviewed all documentation for this visit. The documentation on  11/22/22   for the exam, diagnosis, procedures, and orders are all accurate and complete.   Alfredia Ferguson, PA-C  [facility] at [facility] [phone] (phone) [phone] (fax)  [facility]

## 2022-12-03 ENCOUNTER — Ambulatory Visit: Payer: BC Managed Care – PPO | Admitting: Family Medicine

## 2022-12-03 ENCOUNTER — Encounter: Payer: Self-pay | Admitting: Physician Assistant

## 2022-12-03 ENCOUNTER — Ambulatory Visit: Payer: BC Managed Care – PPO | Admitting: Physician Assistant

## 2022-12-03 VITALS — BP 104/62 | HR 64 | Temp 97.8°F | Resp 20 | Wt 212.0 lb

## 2022-12-03 DIAGNOSIS — I10 Essential (primary) hypertension: Secondary | ICD-10-CM | POA: Diagnosis not present

## 2022-12-03 DIAGNOSIS — T783XXA Angioneurotic edema, initial encounter: Secondary | ICD-10-CM | POA: Diagnosis not present

## 2022-12-03 DIAGNOSIS — H05222 Edema of left orbit: Secondary | ICD-10-CM

## 2022-12-03 MED ORDER — AMLODIPINE BESYLATE 5 MG PO TABS: 5.0000 mg | ORAL_TABLET | Freq: Every day | ORAL | 1 refills | Status: DC

## 2022-12-03 MED ORDER — PREDNISONE 20 MG PO TABS: 20.0000 mg | ORAL_TABLET | Freq: Every day | ORAL | 0 refills | Status: DC

## 2022-12-03 NOTE — Progress Notes (Signed)
Established patient visit   Patient: Keith Hardin   DOB: 1950/08/19   72 y.o. Male  MRN: [id number] Visit Date: 12/03/2022  Today's healthcare provider: Alfredia Ferguson, PA-C   Bee sting to face, eye and lip swelling  Subjective    HPI  Pt was seen for a bee sting to the face 11/22/22, prescribed a prednisone taper and prn hydroxzyine for itching.   Pt reports the swelling went away, and then it returned to his eye and his top lip. Denies pain, fever, shortness of breath, throat/tongue swelling.    Medications: Outpatient Medications Prior to Visit  Medication Sig   aspirin 81 MG tablet Take 81 mg by mouth daily.   augmented betamethasone dipropionate (DIPROLENE-AF) 0.05 % cream APPLY TOPICALLY TWICE A DAY   brimonidine (ALPHAGAN) 0.2 % ophthalmic solution INSTILL 1 DROP INTO AFFECTED EYE(S) BY OPHTHALMIC ROUTE EVERY 8 HOURS   cloNIDine (CATAPRES) 0.3 MG tablet TAKE 1 TABLET(0.3 MG) BY MOUTH TWICE DAILY   dapagliflozin propanediol (FARXIGA) 10 MG TABS tablet Take 10 mg by mouth daily.   dorzolamide-timolol (COSOPT) 22.3-6.8 MG/ML ophthalmic solution 1 drop 2 (two) times daily.   Ferrous Sulfate (IRON) 325 (65 Fe) MG TABS Take 1 tablet by mouth daily.   fexofenadine (ALLEGRA) 180 MG tablet Take 180 mg by mouth daily.   fluticasone (FLONASE) 50 MCG/ACT nasal spray SPRAY 2 SPRAYS INTO EACH NOSTRIL EVERY DAY   furosemide (LASIX) 20 MG tablet TAKE 1 TABLET (20 MG TOTAL) BY MOUTH AS NEEDED. FOR SWELLING   glimepiride (AMARYL) 4 MG tablet Take by mouth.   glucose blood (ACCU-CHEK AVIVA) test strip Use to test blood sugar three times daily.   hydrochlorothiazide (HYDRODIURIL) 25 MG tablet Take 1 tablet (25 mg total) by mouth daily.   hydrOXYzine (VISTARIL) 25 MG capsule Take 1 capsule (25 mg total) by mouth every 8 (eight) hours as needed for itching.   Insulin Pen Needle (BD PEN NEEDLE NANO 2ND GEN) 32G X 4 MM MISC USE AS DIRECTED TO INJECT INSULIN   Lancets (ACCU-CHEK  MULTICLIX) lancets Use as directed 3 times daily to check blood sugar   metFORMIN (GLUCOPHAGE) 1000 MG tablet Take 1,000 mg by mouth 2 (two) times daily with a meal.   Omega-3 Fatty Acids (FISH OIL) 1000 MG CAPS Take 1 capsule by mouth 2 (two) times daily.   Omeprazole-Sodium Bicarbonate (ZEGERID OTC) 20-1100 MG CAPS capsule TAKE ONE CAPSULE BY MOUTH EVERY DAY BEFORE BREAKFAST   pioglitazone (ACTOS) 30 MG tablet TAKE 1 TABLET(30 MG) BY MOUTH DAILY   potassium chloride SA (KLOR-CON M20) 20 MEQ tablet Take 1 tablet (20 mEq total) by mouth 2 (two) times daily.   pravastatin (PRAVACHOL) 80 MG tablet TAKE 1 TABLET(80 MG) BY MOUTH ONCE DAILY   TRESIBA FLEXTOUCH 100 UNIT/ML SOPN FlexTouch Pen Inject 40 Units into the skin daily.   [DISCONTINUED] lisinopril (ZESTRIL) 40 MG tablet Take 1 tablet (40 mg total) by mouth daily.   No facility-administered medications prior to visit.    Review of Systems  Constitutional:  Negative for fatigue and fever.  Respiratory:  Negative for cough and shortness of breath.   Cardiovascular:  Negative for chest pain, palpitations and leg swelling.  Skin:        swelling  Neurological:  Negative for dizziness and headaches.      Objective    BP 104/62 (BP Location: Left Arm, Patient Position: Sitting, Cuff Size: Normal)   Pulse 64   Temp  97.8 F (36.6 C) (Oral)   Resp 20   Wt 212 lb (96.2 kg)   SpO2 94%   BMI 36.39 kg/m   Physical Exam Vitals reviewed.  Constitutional:      Appearance: He is not ill-appearing.  HENT:     Head: Normocephalic.     Comments: Significant edema top lip  L eye with swelling beneath the eye. No swelling of the eyelid.  Eyes:     Extraocular Movements: Extraocular movements intact.     Conjunctiva/sclera: Conjunctivae normal.     Comments: No pain with EOM no light sensitivity.  Cardiovascular:     Rate and Rhythm: Normal rate.  Pulmonary:     Effort: Pulmonary effort is normal. No respiratory distress.     Breath  sounds: Normal breath sounds. No wheezing.  Neurological:     General: No focal deficit present.     Mental Status: He is alert and oriented to person, place, and time.  Psychiatric:        Mood and Affect: Mood normal.        Behavior: Behavior normal.      No results found for any visits on 12/03/22.  Assessment & Plan     1. Angioedema, initial encounter Possibility given persistent lip swelling -- this was not where the bee sting was, and lisinopril use.  Lungs clear Recommending d/c lisinopril  2. Orbital edema, left Improved from last visit. Now just slight puffiness under L eye.  No erythema, warmth, pain with EOM movements.  Few extra days of prednisone rx .  - predniSONE (DELTASONE) 20 MG tablet; Take 1 tablet (20 mg total) by mouth daily with breakfast.  Dispense: 5 tablet; Refill: 0  3. Essential hypertension Switch lisinopril to amlodipine d/t possible angioedema Given low BP today, pt can wait till Sun-Monday to start back on amlodipine 5 mg. Will need f/u with pcp to dose titrate.   - amLODipine (NORVASC) 5 MG tablet; Take 1 tablet (5 mg total) by mouth daily.  Dispense: 90 tablet; Refill: 1   Return if symptoms worsen or fail to improve.      I, Alfredia Ferguson, PA-C have reviewed all documentation for this visit. The documentation on  12/03/22   for the exam, diagnosis, procedures, and orders are all accurate and complete.    Alfredia Ferguson, PA-C  [facility] at [facility] [phone] (phone) [phone] (fax)  [facility]

## 2022-12-03 NOTE — Assessment & Plan Note (Signed)
Switch lisinopril to amlodipine d/t possible angioedema Given low BP today, pt can wait till Sun-Monday to start back on amlodipine 5 mg. Will need f/u with pcp to dose titrate.

## 2022-12-14 ENCOUNTER — Ambulatory Visit: Payer: BC Managed Care – PPO | Admitting: Family

## 2022-12-15 ENCOUNTER — Ambulatory Visit: Payer: BC Managed Care – PPO | Admitting: Family

## 2022-12-15 VITALS — BP 122/60 | HR 57 | Resp 18 | Ht 64.0 in | Wt 213.0 lb

## 2022-12-15 DIAGNOSIS — E119 Type 2 diabetes mellitus without complications: Secondary | ICD-10-CM

## 2022-12-15 DIAGNOSIS — I10 Essential (primary) hypertension: Secondary | ICD-10-CM

## 2022-12-15 DIAGNOSIS — J309 Allergic rhinitis, unspecified: Secondary | ICD-10-CM

## 2022-12-15 DIAGNOSIS — D509 Iron deficiency anemia, unspecified: Secondary | ICD-10-CM

## 2022-12-15 DIAGNOSIS — Z794 Long term (current) use of insulin: Secondary | ICD-10-CM | POA: Diagnosis not present

## 2022-12-15 DIAGNOSIS — E7800 Pure hypercholesterolemia, unspecified: Secondary | ICD-10-CM

## 2022-12-15 LAB — COMPREHENSIVE METABOLIC PANEL
ALT: 19 U/L (ref 0–53)
AST: 16 U/L (ref 0–37)
Albumin: 3.9 g/dL (ref 3.5–5.2)
Alkaline Phosphatase: 51 U/L (ref 39–117)
BUN: 21 mg/dL (ref 6–23)
CO2: 29 mEq/L (ref 19–32)
Calcium: 9 mg/dL (ref 8.4–10.5)
Chloride: 103 mEq/L (ref 96–112)
Creatinine, Ser: 1.18 mg/dL (ref 0.40–1.50)
GFR: 61.91 mL/min (ref >60.00)
Glucose, Bld: 91 mg/dL (ref 70–99)
Potassium: 3.8 mEq/L (ref 3.5–5.1)
Sodium: 142 mEq/L (ref 135–145)
Total Bilirubin: 0.5 mg/dL (ref 0.2–1.2)
Total Protein: 6 g/dL (ref 6.0–8.3)

## 2022-12-15 LAB — HEMOGLOBIN A1C: Hgb A1c MFr Bld: 7.7 % — ABNORMAL HIGH (ref 4.6–6.5)

## 2022-12-15 LAB — LIPID PANEL
Cholesterol: 170 mg/dL (ref 0–200)
HDL: 46.6 mg/dL (ref >39.00)
NonHDL: 123.64
Total CHOL/HDL Ratio: 4
Triglycerides: 206 mg/dL — ABNORMAL HIGH (ref 0.0–149.0)
VLDL: 41.2 mg/dL — ABNORMAL HIGH (ref 0.0–40.0)

## 2022-12-15 LAB — CBC WITH DIFFERENTIAL/PLATELET
Basophils Absolute: 0.1 10*3/uL (ref 0.0–0.1)
Basophils Relative: 1 % (ref 0.0–3.0)
Eosinophils Absolute: 0.1 10*3/uL (ref 0.0–0.7)
Eosinophils Relative: 2.6 % (ref 0.0–5.0)
HCT: 38.8 % — ABNORMAL LOW (ref 39.0–52.0)
Hemoglobin: 12.7 g/dL — ABNORMAL LOW (ref 13.0–17.0)
Lymphocytes Relative: 33.1 % (ref 12.0–46.0)
Lymphs Abs: 1.7 10*3/uL (ref 0.7–4.0)
MCHC: 32.6 g/dL (ref 30.0–36.0)
MCV: 85.3 fl (ref 78.0–100.0)
Monocytes Absolute: 0.5 10*3/uL (ref 0.1–1.0)
Monocytes Relative: 8.8 % (ref 3.0–12.0)
Neutro Abs: 2.8 10*3/uL (ref 1.4–7.7)
Neutrophils Relative %: 54.5 % (ref 43.0–77.0)
Platelets: 203 10*3/uL (ref 150.0–400.0)
RBC: 4.54 Mil/uL (ref 4.22–5.81)
RDW: 15.1 % (ref 11.5–15.5)
WBC: 5.2 10*3/uL (ref 4.0–10.5)

## 2022-12-15 LAB — LDL CHOLESTEROL, DIRECT: Direct LDL: 91 mg/dL

## 2022-12-15 MED ORDER — TRESIBA FLEXTOUCH 100 UNIT/ML ~~LOC~~ SOPN: 30.0000 [IU] | PEN_INJECTOR | Freq: Every day | SUBCUTANEOUS | Status: AC

## 2022-12-15 NOTE — Assessment & Plan Note (Signed)
Update A1C today. He is scheduled to follow up with his endocrinologist.

## 2022-12-15 NOTE — Assessment & Plan Note (Signed)
Stable with allegra, continue same.

## 2022-12-15 NOTE — Assessment & Plan Note (Addendum)
Lab Results  Component Value Date   CHOL 162 12/22/2021   HDL 45.10 12/22/2021   LDLCALC 81 12/22/2021   TRIG 177.0 (H) 12/22/2021   CHOLHDL 4 12/22/2021   Last LDL at goal on pravastatin 80mg . Update lipid panel.

## 2022-12-15 NOTE — Progress Notes (Signed)
Subjective:     Patient ID: Keith Hardin, male    DOB: 1951/03/03, 72 y.o.   MRN: [id number]  Chief Complaint  Patient presents with   Follow-up    HPI  Discussed the use of AI scribe software for clinical note transcription with the patient, who gave verbal consent to proceed.  History of Present Illness   The patient, with a history of hypertension, diabetes, and hyperlipidemia, presents for a routine follow-up. He reports a recent allergic reaction to Lisinopril, which manifested as facial swelling. The medication was discontinued, and the patient was treated with an unspecified medication (likley prednisone). However, the swelling recurred two days after completing the treatment. The patient's current antihypertensive regimen includes amlodipine 5mg , clonidine 0.3mg  twice daily, and hydrochlorothiazide.  The patient's diabetes is managed with [place], metformin, Actos, and [place]. He reports a change in the [place] dosage from 40 units to 30 units once daily. He is scheduled to see his endocrinologist soon and will have his A1c updated today.  For hyperlipidemia, the patient is on Pravachol 80mg  and tolerates it well. He also takes an iron supplement, and the levels will be checked today.  The patient also has allergies and is currently taking Allegra. He reports that his allergies have been under control.     BP Readings from Last 3 Encounters:  12/15/22 122/60  12/03/22 104/62  11/22/22 118/62     Lab Results  Component Value Date   HGBA1C 6.9 07/24/2020   HGBA1C 704 12/21/2019   HGBA1C 7.0 06/12/2019   Lab Results  Component Value Date   MICROALBUR <0.7 06/22/2022   LDLCALC 81 12/22/2021   CREATININE 1.06 06/22/2022       Health Maintenance Due  Topic Date Due   COVID-19 Vaccine (6 - 2023-24 season) 12/25/2021   HEMOGLOBIN A1C  06/24/2022   INFLUENZA VACCINE  11/25/2022    Past Medical History:  Diagnosis Date   Allergy    Colon polyps     Diabetes mellitus    type II   GERD (gastroesophageal reflux disease)    Glaucoma    Heart murmur    Hyperlipidemia    Hypertension    Hypogonadism male 05/02/2011   Orchitis and epididymitis 01/2008   left with left hydrocele    Past Surgical History:  Procedure Laterality Date   ACHILLES TENDON REPAIR     right    Family History  Problem Relation Age of Onset   Seizures Mother    Hypertension Mother    Cancer Brother        stomach   Hypothyroidism Sister    Hypertension Brother    Colon cancer Neg Hx    Diabetes Neg Hx    Heart attack Neg Hx    Hyperlipidemia Neg Hx    Sudden death Neg Hx     Social History   Socioeconomic History   Marital status: Married    Spouse name: Cicero Duck   Number of children: 0   Years of education: Not on file   Highest education level: Not on file  Occupational History   Occupation: Unemployed    Employer: UNEMPLOYED  Tobacco Use   Smoking status: Never   Smokeless tobacco: Never  Vaping Use   Vaping status: Never Used  Substance and Sexual Activity   Alcohol use: No   Drug use: No   Sexual activity: Yes    Partners: Female  Other Topics Concern   Not on file  Social History  Narrative   Occupation: works for state as Production designer, 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 90 tablet 1   aspirin 81 MG tablet Take 81 mg by mouth daily.     augmented betamethasone dipropionate (DIPROLENE-AF) 0.05 % cream APPLY TOPICALLY TWICE A DAY 50 g 0   brimonidine (ALPHAGAN) 0.2 % ophthalmic  solution INSTILL 1 DROP INTO AFFECTED EYE(S) BY OPHTHALMIC ROUTE EVERY 8 HOURS  6   cloNIDine (CATAPRES) 0.3 MG tablet TAKE 1 TABLET(0.3 MG) BY MOUTH TWICE DAILY 180 tablet 1   dapagliflozin propanediol (FARXIGA) 10 MG TABS tablet Take 10 mg by mouth daily.     dorzolamide-timolol (COSOPT) 22.3-6.8 MG/ML ophthalmic solution 1 drop 2 (two) times daily.     Ferrous Sulfate (IRON) 325 (65 Fe) MG TABS Take 1 tablet by mouth daily. 30 each 0   fexofenadine (ALLEGRA) 180 MG tablet Take 180 mg by mouth daily.     fluticasone (FLONASE) 50 MCG/ACT nasal spray SPRAY 2 SPRAYS INTO EACH NOSTRIL EVERY DAY 48 mL 1   furosemide (LASIX) 20 MG tablet TAKE 1 TABLET (20 MG TOTAL) BY MOUTH AS NEEDED. FOR SWELLING 90 tablet 0   glimepiride (AMARYL) 4 MG tablet Take by mouth.     glucose blood (ACCU-CHEK AVIVA) test strip Use to test blood sugar three times daily. 50 each 1   hydrochlorothiazide (HYDRODIURIL) 25 MG tablet Take 1 tablet (25 mg total) by mouth daily. 90 tablet 1   hydrOXYzine (VISTARIL) 25 MG capsule Take 1 capsule (25 mg total) by mouth every 8 (eight) hours as needed for itching. 30 capsule 0   Insulin Pen Needle (BD PEN NEEDLE NANO 2ND GEN) 32G X 4 MM MISC USE AS DIRECTED TO INJECT INSULIN 100 each 4   Lancets (ACCU-CHEK MULTICLIX) lancets Use as directed 3 times daily to check blood sugar 102 each 2   metFORMIN (GLUCOPHAGE) 1000 MG tablet Take 1,000 mg by mouth 2 (two) times daily with a meal.     Omega-3 Fatty Acids (FISH OIL) 1000 MG CAPS Take 1 capsule by mouth 2 (two) times daily.     Omeprazole-Sodium Bicarbonate (ZEGERID OTC) 20-1100 MG CAPS capsule TAKE ONE CAPSULE BY MOUTH EVERY DAY BEFORE BREAKFAST 28 capsule 5   pioglitazone (ACTOS) 30 MG tablet TAKE 1 TABLET(30 MG) BY MOUTH DAILY 90 tablet 0   potassium chloride SA (KLOR-CON M20) 20 MEQ tablet Take 1 tablet (20 mEq total) by mouth 2 (two) times daily. 180 tablet 1   pravastatin (PRAVACHOL) 80 MG tablet TAKE 1 TABLET(80 MG) BY MOUTH ONCE  DAILY 90 tablet 0   predniSONE (DELTASONE) 20 MG tablet Take 1 tablet (20 mg total) by mouth daily with breakfast. 5 tablet 0   TRESIBA FLEXTOUCH 100 UNIT/ML SOPN FlexTouch Pen Inject 40 Units into the skin daily.  2   No facility-administered medications prior to visit.    Allergies  Allergen Reactions   Lisinopril     Angioedema/facial swelling    ROS  See HPI    Objective:    Physical Exam Constitutional:      General: He is not in acute distress.    Appearance: He is well-developed.  HENT:     Head: Normocephalic and atraumatic.  Cardiovascular:     Rate and Rhythm: Normal rate and regular rhythm.     Heart sounds: No murmur heard. Pulmonary:     Effort: Pulmonary effort is normal. No respiratory distress.     Breath sounds: Normal breath sounds. No wheezing or rales.  Skin:    General: Skin is warm and dry.  Neurological:     Mental Status: He is alert and oriented to person, place, and time.  Psychiatric:        Behavior: Behavior normal.        Thought Content: Thought content normal.      BP 122/60   Pulse (!) 57   Resp 18   Ht 5\' 4"  (1.626 m)   Wt 213 lb (96.6 kg)   SpO2 100%   BMI 36.56 kg/m  Wt Readings from Last 3 Encounters:  12/15/22 213 lb (96.6 kg)  12/03/22 212 lb (96.2 kg)  11/22/22 212 lb (96.2 kg)       Assessment & Plan:   Problem List Items Addressed This Visit       Unprioritized   Hyperlipidemia    Lab Results  Component Value Date   CHOL 162 12/22/2021   HDL 45.10 12/22/2021   LDLCALC 81 12/22/2021   TRIG 177.0 (H) 12/22/2021   CHOLHDL 4 12/22/2021   Last LDL at goal on pravastatin 80mg . Update lipid panel.       Essential hypertension    BP Readings from Last 3 Encounters:  12/15/22 122/60  12/03/22 104/62  11/22/22 118/62   BP stable on amlodipine. Lisinopril was discontinued per patient report due to facial swelling. I think this might have been through the [place].       Diabetes type 2, controlled (HCC) -  Primary    Update A1C today. He is scheduled to follow up with his endocrinologist.       Relevant Medications   TRESIBA FLEXTOUCH 100 UNIT/ML FlexTouch Pen   Other Relevant Orders   HgB A1c   Comp Met (CMET)   Anemia, iron deficiency    Lab Results  Component Value Date   WBC 4.3 12/22/2021   HGB 13.0 12/22/2021   HCT 39.3 12/22/2021   MCV 83.5 12/22/2021   PLT 241.0 12/22/2021   Patient continues iron supplement.  Continue same, update labs.      Relevant Orders   Lipid panel   CBC w/Diff   Iron, TIBC and Ferritin Panel    I have changed Silvana Newness Evaristo Bury FlexTouch. I am also having him maintain his aspirin, accu-chek multiclix, Fish Oil, fexofenadine, glucose blood, dapagliflozin propanediol, Omeprazole-Sodium Bicarbonate, Iron, pioglitazone, brimonidine, glimepiride, metFORMIN, dorzolamide-timolol, augmented betamethasone dipropionate, BD Pen Needle Nano 2nd Gen, hydrochlorothiazide, potassium chloride SA, cloNIDine, fluticasone, pravastatin, furosemide, hydrOXYzine, predniSONE, and amLODipine.  Meds ordered this encounter  Medications   TRESIBA FLEXTOUCH 100 UNIT/ML FlexTouch Pen    Sig: Inject 30 Units into the skin daily.    Order Specific Question:   Supervising Provider    Answer:   Danise Edge A [[id number]]

## 2022-12-15 NOTE — Assessment & Plan Note (Signed)
BP Readings from Last 3 Encounters:  12/15/22 122/60  12/03/22 104/62  11/22/22 118/62   BP stable on amlodipine. Lisinopril was discontinued per patient report due to facial swelling. I think this might have been through the [place].

## 2022-12-15 NOTE — Assessment & Plan Note (Addendum)
Lab Results  Component Value Date   WBC 4.3 12/22/2021   HGB 13.0 12/22/2021   HCT 39.3 12/22/2021   MCV 83.5 12/22/2021   PLT 241.0 12/22/2021   Patient continues iron supplement.  Continue same, update labs.

## 2022-12-16 ENCOUNTER — Telehealth: Payer: Self-pay | Admitting: Family

## 2022-12-16 DIAGNOSIS — D509 Iron deficiency anemia, unspecified: Secondary | ICD-10-CM

## 2022-12-16 LAB — IRON,TIBC AND FERRITIN PANEL
%SAT: 24 % (ref 20–48)
Ferritin: 78 ng/mL (ref 24–380)
Iron: 72 ug/dL (ref 50–180)
TIBC: 299 mcg/dL (calc) (ref 250–425)

## 2022-12-16 MED ORDER — IRON 325 (65 FE) MG PO TABS: 1.0000 | ORAL_TABLET | ORAL | Status: AC

## 2022-12-16 NOTE — Telephone Encounter (Signed)
Patient is slightly anemic and sugar is above goal.  Please keep follow up with endocrinology.  Iron level looks good.  He can change the iron to 325mg  every other day rather than daily. I would like him to complete and IFOB dx anemia to check for blood loss in his digestive tract.    Triglycerides are mildly elevated.  Please work on avoiding concentrated sweets, and limiting white carbs (rice/bread/pasta/potatoes).  Instead substitute whole grain versions with reasonable portions.

## 2022-12-17 NOTE — Addendum Note (Signed)
Addended by: Wilford Corner on: 12/17/2022 02:18 PM   Modules accepted: Orders

## 2022-12-17 NOTE — Telephone Encounter (Signed)
He will try to pick up IFOB kit today

## 2022-12-17 NOTE — Telephone Encounter (Signed)
Patient notified of results, to take iron every other day, also about dietary changes and he verbalized unsderstanding

## 2022-12-28 ENCOUNTER — Other Ambulatory Visit (INDEPENDENT_AMBULATORY_CARE_PROVIDER_SITE_OTHER): Payer: BC Managed Care – PPO

## 2022-12-28 DIAGNOSIS — D509 Iron deficiency anemia, unspecified: Secondary | ICD-10-CM

## 2022-12-28 NOTE — Progress Notes (Signed)
Sample drop off

## 2022-12-29 ENCOUNTER — Telehealth: Payer: Self-pay

## 2022-12-29 DIAGNOSIS — R195 Other fecal abnormalities: Secondary | ICD-10-CM | POA: Insufficient documentation

## 2022-12-29 LAB — FECAL OCCULT BLOOD, IMMUNOCHEMICAL: Fecal Occult Bld: POSITIVE — AB

## 2022-12-29 NOTE — Telephone Encounter (Signed)
CRITICAL VALUE STICKER  CRITICAL VALUE: Positive IFOB   MESSENGER (representative from lab): Jacki Cones

## 2022-12-29 NOTE — Telephone Encounter (Signed)
Please advise pt that his ifob is showing blood. I would like for him to see Gastroenterology for further evaluation. They should be reaching out to him to schedule.

## 2022-12-29 NOTE — Telephone Encounter (Signed)
Patient was notified of results and referral. He verbalized understanding.

## 2022-12-31 ENCOUNTER — Telehealth: Payer: Self-pay | Admitting: Family

## 2022-12-31 NOTE — Telephone Encounter (Signed)
Pt called to request his recent lab results to be printed out. Please call & advise when pt can come and pick up results.

## 2023-01-03 NOTE — Telephone Encounter (Signed)
Wife Alcario Drought notified labs have printed and are ready for pick up at the front desk  [facility] Student Marian Sorrow

## 2023-01-12 ENCOUNTER — Other Ambulatory Visit: Payer: Self-pay | Admitting: Family

## 2023-01-12 DIAGNOSIS — I10 Essential (primary) hypertension: Secondary | ICD-10-CM

## 2023-03-12 ENCOUNTER — Other Ambulatory Visit: Payer: Self-pay | Admitting: Family

## 2023-03-20 ENCOUNTER — Other Ambulatory Visit: Payer: Self-pay | Admitting: Family

## 2023-03-20 DIAGNOSIS — J0100 Acute maxillary sinusitis, unspecified: Secondary | ICD-10-CM

## 2023-03-23 ENCOUNTER — Other Ambulatory Visit: Payer: Self-pay | Admitting: Family

## 2023-03-23 DIAGNOSIS — I10 Essential (primary) hypertension: Secondary | ICD-10-CM

## 2023-03-24 MED ORDER — AMLODIPINE BESYLATE 5 MG PO TABS: 5.0000 mg | ORAL_TABLET | Freq: Every day | ORAL | 0 refills | Status: DC

## 2023-04-16 ENCOUNTER — Other Ambulatory Visit: Payer: Self-pay | Admitting: Family

## 2023-04-16 DIAGNOSIS — I10 Essential (primary) hypertension: Secondary | ICD-10-CM

## 2023-06-02 ENCOUNTER — Ambulatory Visit: Payer: Self-pay | Admitting: Family

## 2023-06-02 NOTE — Telephone Encounter (Signed)
 Chief Complaint: Viral symptoms Symptoms: sore throat, runny nose, cough Frequency: today Pertinent Negatives: Patient denies fever Disposition: [] ED /[] Urgent Care (no appt availability in office) / [] Appointment(In office/virtual)/ []  [place] [facility]/ [x] Home Care/ [] Refused Recommended Disposition /[] [place] [facility]/ []  Follow-up with PCP Additional Notes: Patient called in stating he was experiencing viral symptoms starting today including cough, throat discomfort from post nasal drip, runny nose. Patient denies fever at this time. Patient was exposed to niece last week that had the flu and wife has been battling a virus. Advised patient to rest and hydrate as much as possible and to monitor for fever. Advised patient to call back if symptoms worsen or fever develops.    Copied from CRM [id number]. Topic: Clinical - Red Word Triage >> Jun 02, 2023  3:40 PM Russell PARAS wrote: Red Word that prompted transfer to Nurse Triage: Wife and niece tested positive for viral illness. Symptoms started this morning, sore throat, runny nose, cough. No fever. Reason for Disposition  [1] Probable mild influenza (no fever) or a common cold, with no complications AND [2] NOT HIGH RISK  Answer Assessment - Initial Assessment Questions 1. WORST SYMPTOM: What is your worst symptom? (e.g., cough, runny nose, muscle aches, headache, sore throat, fever)      Throat discomfort from drainage 2. ONSET: When did your flu symptoms start?      This morning 3. COUGH: How bad is the cough?       Moderate cough but non-productive 4. RESPIRATORY DISTRESS: Describe your breathing.      Breathing is normal 5. FEVER: Do you have a fever? If Yes, ask: What is your temperature, how was it measured, and when did it start?     No 6. EXPOSURE: Were you exposed to someone with influenza?       Niece had flu all last week, wife had viral illness 7. FLU VACCINE: Did you get a flu shot this year?     Yes  in November or December 8. HIGH RISK DISEASE: Do you have any chronic medical problems? (e.g., heart or lung disease, asthma, weak immune system, or other HIGH RISK conditions)     Heart murmur  10. OTHER SYMPTOMS: Do you have any other symptoms?  (e.g., runny nose, muscle aches, headache, sore throat)       Sore throat from drainage, runny nose, cough  Protocols used: Influenza (Flu) - [facility]

## 2023-06-03 MED ORDER — OSELTAMIVIR PHOSPHATE 75 MG PO CAPS: 75.0000 mg | ORAL_CAPSULE | Freq: Two times a day (BID) | ORAL | 0 refills | Status: AC

## 2023-06-03 NOTE — Addendum Note (Signed)
 Addended by: Dorrene Gaucher on: 06/03/2023 05:08 PM   Modules accepted: Orders

## 2023-06-06 NOTE — Telephone Encounter (Signed)
 It looks like he never sent my message about sending tamiflu  to his pharmacy.   Did he start the tamiflu ?  Should schedule appointment if his symptoms are not improving.

## 2023-06-09 ENCOUNTER — Other Ambulatory Visit: Payer: Self-pay | Admitting: Family

## 2023-07-27 ENCOUNTER — Ambulatory Visit: Admitting: Family

## 2023-07-27 VITALS — BP 134/56 | HR 62 | Temp 97.8°F | Resp 16 | Ht 64.0 in | Wt 215.0 lb

## 2023-07-27 DIAGNOSIS — D509 Iron deficiency anemia, unspecified: Secondary | ICD-10-CM

## 2023-07-27 DIAGNOSIS — N401 Enlarged prostate with lower urinary tract symptoms: Secondary | ICD-10-CM | POA: Diagnosis not present

## 2023-07-27 DIAGNOSIS — I10 Essential (primary) hypertension: Secondary | ICD-10-CM | POA: Diagnosis not present

## 2023-07-27 DIAGNOSIS — E119 Type 2 diabetes mellitus without complications: Secondary | ICD-10-CM

## 2023-07-27 DIAGNOSIS — E7800 Pure hypercholesterolemia, unspecified: Secondary | ICD-10-CM

## 2023-07-27 DIAGNOSIS — K219 Gastro-esophageal reflux disease without esophagitis: Secondary | ICD-10-CM

## 2023-07-27 DIAGNOSIS — R0989 Other specified symptoms and signs involving the circulatory and respiratory systems: Secondary | ICD-10-CM

## 2023-07-27 DIAGNOSIS — I350 Nonrheumatic aortic (valve) stenosis: Secondary | ICD-10-CM

## 2023-07-27 DIAGNOSIS — Z1211 Encounter for screening for malignant neoplasm of colon: Secondary | ICD-10-CM

## 2023-07-27 DIAGNOSIS — N529 Male erectile dysfunction, unspecified: Secondary | ICD-10-CM | POA: Diagnosis not present

## 2023-07-27 MED ORDER — TAMSULOSIN HCL 0.4 MG PO CAPS: 0.4000 mg | ORAL_CAPSULE | Freq: Every day | ORAL | 3 refills | Status: DC

## 2023-07-27 NOTE — Assessment & Plan Note (Signed)
 Clinically compensated. Update echo- last echo 2021.

## 2023-07-27 NOTE — Assessment & Plan Note (Signed)
 Lab Results  Component Value Date   HGBA1C 7.7 (H) 12/15/2022   HGBA1C 6.9 07/24/2020   HGBA1C 704 12/21/2019   Lab Results  Component Value Date   MICROALBUR <0.7 06/22/2022   LDLCALC 81 12/22/2021   CREATININE 1.18 12/15/2022   Was a little elevated last visit with Endo. Defer management to endo.

## 2023-07-27 NOTE — Assessment & Plan Note (Signed)
 Not currently an issue, not on meds.

## 2023-07-27 NOTE — Assessment & Plan Note (Signed)
 Reports will controlled with every other day Zegerid.

## 2023-07-27 NOTE — Patient Instructions (Signed)
 VISIT SUMMARY:  Today, you came in to discuss your urinary problems and to request a colonoscopy. You mentioned having difficulty maintaining a steady stream of urine and experiencing bloating with blood on your toilet tissue. We reviewed your history, including your last colonoscopy in 2018, which showed external and internal hemorrhoids.  YOUR PLAN:  -URINARY HESITANCY: Urinary hesitancy means having trouble starting or maintaining a steady stream of urine. This could be due to an enlarged prostate or other urinary issues. We need to do further evaluation to determine the exact cause.  -HEMORRHOIDS: Hemorrhoids are swollen veins in the lower part of your rectum and anus. They can cause bloating and blood on toilet tissue. Your previous colonoscopy confirmed this diagnosis, and we have planned a follow-up colonoscopy for June 2025 to monitor your condition.  INSTRUCTIONS:  Please schedule your follow-up colonoscopy for June 2025. If your urinary symptoms worsen or you experience any new symptoms, please contact our office for further evaluation.

## 2023-07-27 NOTE — Progress Notes (Unsigned)
   Established Patient Office Visit  Subjective   Patient ID: Keith Hardin, male    DOB: 1950/05/26  Age: 73 y.o. MRN: [id number]  Chief Complaint  Patient presents with   Diabetes    Here for follow up   Hypertension    Here for follow up    Patient presents for routine follow-up of hypertension and diabetes. He states that is currently having urinary hesitancy, and he feels like he is having to strain to urinate. He denies penile discharge, dysuria, visible hematuria, flank pain, and scrotal/testicular pain. He reports that he has not seen a urologist in the past.   Keith Hardin is also requesting a colonoscopy due to recent abdominal bloating. He also reports that he has noticed blood on the tissue after wiping. States that he has not noticed blood in or on his stool, nor has he noticed blood in the toilet.   Patient states that he recently had a diabetic eye exam at [facility] in [facility]. He reports that the exam was normal. He also states that they discontinued his Alphagan and Cosopt.  Diabetes  Hypertension   ROS See HPI   Objective:     BP (!) 134/56 (BP Location: Right Arm, Patient Position: Sitting, Cuff Size: Large)   Pulse 62   Temp 97.8 F (36.6 C) (Oral)   Resp 16   Ht 5\' 4"  (1.626 m)   Wt 215 lb (97.5 kg)   SpO2 97%   BMI 36.90 kg/m    Physical Exam Vitals reviewed.  Constitutional:      Appearance: Normal appearance.  HENT:     Head: Normocephalic and atraumatic.     Right Ear: External ear normal.     Left Ear: External ear normal.  Cardiovascular:     Rate and Rhythm: Normal rate and regular rhythm.     Pulses: Normal pulses.     Heart sounds: Normal heart sounds.  Pulmonary:     Effort: Pulmonary effort is normal.     Breath sounds: Normal breath sounds.  Skin:    General: Skin is warm and dry.     Capillary Refill: Capillary refill takes less than 2 seconds.  Neurological:     Mental Status: He is alert and oriented to  person, place, and time.  Psychiatric:        Mood and Affect: Mood normal.        Behavior: Behavior normal.      Assessment & Plan:   Hypertension - stable on clonidine, amlodipine, and hydrochlorothiazide. Continue same.  Hyperlipidemia - stable on pravachol. Continue same.  Lab Results  Component Value Date   CHOL 170 12/15/2022   HDL 46.60 12/15/2022   LDLCALC 81 12/22/2021   LDLDIRECT 91.0 12/15/2022   TRIG 206.0 (H) 12/15/2022   CHOLHDL 4 12/15/2022    Diabetes mellitus - last A1C elevated but is followed and managed by endocrinology. Lab Results  Component Value Date   HGBA1C 7.7 (H) 12/15/2022    Urinary hesitancy - new complaint. PSA today and will start Flomax.  Anemia - CBC and iron studies today. Continue ferrous sulfate every other day.   GERD - stable on Zegerid OTC. Continue same.   Cristopher Peru, RN

## 2023-07-27 NOTE — Assessment & Plan Note (Signed)
 Lab Results  Component Value Date   WBC 5.2 12/15/2022   HGB 12.7 (L) 12/15/2022   HCT 38.8 (L) 12/15/2022   MCV 85.3 12/15/2022   PLT 203.0 12/15/2022   Continues iron every other day, tolerating well. Update cbc and iron studies.

## 2023-07-27 NOTE — Assessment & Plan Note (Signed)
 Had normal ABI in 2023.

## 2023-07-27 NOTE — Assessment & Plan Note (Signed)
 Lab Results  Component Value Date   CHOL 170 12/15/2022   HDL 46.60 12/15/2022   LDLCALC 81 12/22/2021   LDLDIRECT 91.0 12/15/2022   TRIG 206.0 (H) 12/15/2022   CHOLHDL 4 12/15/2022   Lipid stable. Continue pravachol.

## 2023-07-27 NOTE — Progress Notes (Unsigned)
 Subjective:     Patient ID: Keith Hardin, male    DOB: 1950/09/24, 73 y.o.   MRN: [id number]  Chief Complaint  Patient presents with   Diabetes    Here for follow up   Hypertension    Here for follow up    Diabetes  Hypertension    Discussed the use of AI scribe software for clinical note transcription with the patient, who gave verbal consent to proceed.  History of Present Illness   Keith Hardin is a 73 year old male who presents with urinary problems and requests a colonoscopy.  He experiences urinary problems, specifically difficulty in maintaining a steady stream. He describes starting urination, having the stream stop, and needing to push to start again. This has been a persistent issue, affecting his daily activities.  He requests a colonoscopy due to bloating and noticing blood on his toilet tissue, although not in the toilet bowl. His last colonoscopy in 2018 revealed external and internal hemorrhoids. He recalls a significant event where a doctor, Rob Bunting, had a stroke postoperatively, which impacted his perception of medical procedures.    Health Maintenance Due  Topic Date Due   COVID-19 Vaccine (6 - 2024-25 season) 12/26/2022   OPHTHALMOLOGY EXAM  01/21/2023   DTaP/Tdap/Td (2 - Td or Tdap) 04/07/2023   HEMOGLOBIN A1C  06/17/2023   Diabetic kidney evaluation - Urine ACR  06/23/2023    Past Medical History:  Diagnosis Date   Allergy    Colon polyps    Diabetes mellitus    type II   GERD (gastroesophageal reflux disease)    Glaucoma    Heart murmur    Hyperlipidemia    Hypertension    Hypogonadism male 05/02/2011   Orchitis and epididymitis 01/2008   left with left hydrocele    Past Surgical History:  Procedure Laterality Date   ACHILLES TENDON REPAIR     right    Family History  Problem Relation Age of Onset   Seizures Mother    Hypertension Mother    Cancer Brother        stomach   Hypothyroidism Sister    Hypertension  Brother    Colon cancer Neg Hx    Diabetes Neg Hx    Heart attack Neg Hx    Hyperlipidemia Neg Hx    Sudden death Neg Hx     Social History   Socioeconomic History   Marital status: Married    Spouse name: Cicero Duck   Number of children: 0   Years of education: Not on file   Highest education level: Not on file  Occupational History   Occupation: Designer, industrial/product: UNEMPLOYED  Tobacco Use   Smoking status: Never   Smokeless tobacco: Never  Vaping Use   Vaping status: Never Used  Substance and Sexual Activity   Alcohol use: No   Drug use: No   Sexual activity: Yes    Partners: Female  Other Topics Concern   Not on file  Social History Narrative   Occupation: works for state as Production designer, 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 : No  Physical Activity: Not on file  Stress: Not on file  Social Connections: Not on file  Intimate Partner Violence: Not on file    Outpatient Medications Prior to Visit  Medication Sig Dispense Refill   amLODipine (NORVASC) 5 MG tablet Take 1 tablet (5 mg total) by mouth daily. 90 tablet 0   aspirin 81 MG tablet Take 81 mg by mouth daily.     augmented betamethasone dipropionate (DIPROLENE-AF) 0.05 % cream APPLY TOPICALLY TWICE A DAY 50 g 0   brimonidine (ALPHAGAN) 0.2 % ophthalmic solution INSTILL 1 DROP INTO AFFECTED EYE(S) BY OPHTHALMIC ROUTE EVERY 8 HOURS  6    cloNIDine (CATAPRES) 0.3 MG tablet TAKE 1 TABLET(0.3 MG) BY MOUTH TWICE DAILY 174 tablet 0   dapagliflozin propanediol (FARXIGA) 10 MG TABS tablet Take 10 mg by mouth daily.     dorzolamide-timolol (COSOPT) 22.3-6.8 MG/ML ophthalmic solution 1 drop 2 (two) times daily.     Ferrous Sulfate (IRON) 325 (65 Fe) MG TABS Take 1 tablet (325 mg total) by mouth every other day.     fexofenadine (ALLEGRA) 180 MG tablet Take 180 mg by mouth daily.     fluticasone (FLONASE) 50 MCG/ACT nasal spray SPRAY 2 SPRAYS INTO EACH NOSTRIL EVERY DAY 48 mL 1   furosemide (LASIX) 20 MG tablet TAKE 1 TABLET (20 MG TOTAL) BY MOUTH AS NEEDED. FOR SWELLING 90 tablet 0   glimepiride (AMARYL) 4 MG tablet Take by mouth.     glucose blood (ACCU-CHEK AVIVA) test strip Use to test blood sugar three times daily. 50 each 1   hydrochlorothiazide (HYDRODIURIL) 25 MG tablet TAKE 1 TABLET (25 MG TOTAL) BY MOUTH DAILY. 90 tablet 1   hydrOXYzine (VISTARIL) 25 MG capsule Take 1 capsule (25 mg total) by mouth every 8 (eight) hours as needed for itching. 30 capsule 0   Insulin Pen Needle (BD PEN NEEDLE NANO 2ND GEN) 32G X 4 MM MISC USE AS DIRECTED TO INJECT INSULIN 100 each 4   KLOR-CON M20 20 MEQ tablet TAKE 1 TABLET BY MOUTH TWICE A DAY 180 tablet 1   Lancets (ACCU-CHEK MULTICLIX) lancets Use as directed 3 times daily to check blood sugar 102 each 2   metFORMIN (GLUCOPHAGE) 1000 MG tablet Take 1,000 mg by mouth 2 (two) times daily with a meal.     Omega-3 Fatty Acids (FISH OIL) 1000 MG CAPS Take 1 capsule by mouth 2 (two) times daily.     Omeprazole-Sodium Bicarbonate (ZEGERID OTC) 20-1100 MG CAPS capsule TAKE ONE CAPSULE BY MOUTH EVERY DAY BEFORE BREAKFAST 28 capsule 5   pioglitazone (ACTOS) 30 MG tablet TAKE 1 TABLET(30 MG) BY MOUTH DAILY 90 tablet 0   pravastatin (PRAVACHOL) 80 MG tablet TAKE 1 TABLET(80 MG) BY MOUTH ONCE DAILY 90 tablet 0   predniSONE (DELTASONE) 20 MG tablet Take 1 tablet (20 mg total) by mouth daily with breakfast. 5  tablet 0   TRESIBA FLEXTOUCH 100 UNIT/ML FlexTouch Pen Inject 30 Units into the skin daily.     No facility-administered medications prior to visit.    Allergies  Allergen Reactions   Lisinopril     Angioedema/facial swelling    ROS See HPI  Objective:    Physical Exam Constitutional:      General: He is not in acute distress.    Appearance: He is well-developed.  HENT:     Head: Normocephalic and atraumatic.  Cardiovascular:     Rate and Rhythm: Normal rate and regular rhythm.     Heart sounds: No murmur heard. Pulmonary:     Effort: Pulmonary effort is normal. No respiratory distress.     Breath sounds: Normal breath sounds. No wheezing or rales.  Skin:    General: Skin is warm and dry.  Neurological:     Mental Status: He is alert and oriented to person, place, and time.  Psychiatric:        Behavior: Behavior normal.        Thought Content: Thought content normal.      BP (!) 134/56 (BP Location: Right Arm, Patient Position: Sitting, Cuff Size: Large)   Pulse 62   Temp 97.8 F (36.6 C) (Oral)   Resp 16   Ht 5\' 4"  (1.626 m)   Wt 215 lb (97.5 kg)   SpO2 97%   BMI 36.90 kg/m  Wt Readings from Last 3 Encounters:  07/27/23 215 lb (97.5 kg)  12/15/22 213 lb (96.6 kg)  12/03/22 212 lb (96.2 kg)       Assessment & Plan:   Problem List Items Addressed This Visit       Unprioritized   Mild aortic stenosis   Clinically compensated. Update echo- last echo 2021.      Relevant Orders   ECHOCARDIOGRAM COMPLETE   Hyperlipidemia   Lab Results  Component Value Date   CHOL 170 12/15/2022   HDL 46.60 12/15/2022   LDLCALC 81 12/22/2021   LDLDIRECT 91.0 12/15/2022   TRIG 206.0 (H) 12/15/2022   CHOLHDL 4 12/15/2022   Lipid stable. Continue pravachol.       GERD (gastroesophageal reflux disease)   Reports will controlled with every other day Zegerid.        Essential hypertension   Stable, continue hydrochlorothiazide, clonidine and amlodipine.        Erectile dysfunction   Not currently an issue, not on meds.       Diabetes type 2, controlled (HCC)   Lab Results  Component Value Date   HGBA1C 7.7 (H) 12/15/2022   HGBA1C 6.9 07/24/2020   HGBA1C 704 12/21/2019   Lab Results  Component Value Date   MICROALBUR <0.7 06/22/2022   LDLCALC 81 12/22/2021   CREATININE 1.18 12/15/2022   Was a little elevated last visit with Endo. Defer management to endo.       Relevant Orders   Basic Metabolic Panel (BMET)   Decreased pulse   Had normal ABI in 2023.      Benign prostatic hyperplasia with lower urinary tract symptoms   New symptoms.  Will check PSA and start on trial of flomax.       Relevant Medications   tamsulosin (FLOMAX) 0.4 MG CAPS capsule   Other Relevant Orders   PSA   Anemia, iron deficiency   Lab Results  Component Value Date   WBC 5.2 12/15/2022   HGB 12.7 (L) 12/15/2022   HCT 38.8 (L) 12/15/2022   MCV 85.3 12/15/2022   PLT 203.0 12/15/2022   Continues iron every other day, tolerating well. Update cbc and iron studies.      Relevant Orders   CBC w/Diff   IBC panel   Other Visit Diagnoses  Screening for colon cancer    -  Primary   Relevant Orders   Ambulatory referral to Gastroenterology       I am having Keith Hardin start on tamsulosin. I am also having him maintain his aspirin, accu-chek multiclix, Fish Oil, fexofenadine, glucose blood, dapagliflozin propanediol, Omeprazole-Sodium Bicarbonate, pioglitazone, brimonidine, glimepiride, metFORMIN, dorzolamide-timolol, augmented betamethasone dipropionate, BD Pen Needle Nano 2nd Gen, hydrOXYzine, predniSONE, Tresiba FlexTouch, Iron, pravastatin, fluticasone, amLODipine, hydrochlorothiazide, Klor-Con M20, cloNIDine, and furosemide.  Meds ordered this encounter  Medications   tamsulosin (FLOMAX) 0.4 MG CAPS capsule    Sig: Take 1 capsule (0.4 mg total) by mouth daily.    Dispense:  30 capsule    Refill:  3    Supervising Provider:    Danise Edge A [[id number]]

## 2023-07-27 NOTE — Assessment & Plan Note (Signed)
 New symptoms.  Will check PSA and start on trial of flomax.

## 2023-07-27 NOTE — Assessment & Plan Note (Signed)
 Stable, continue hydrochlorothiazide, clonidine and amlodipine.

## 2023-07-28 ENCOUNTER — Encounter: Payer: Self-pay | Admitting: Family

## 2023-07-28 LAB — CBC WITH DIFFERENTIAL/PLATELET
Basophils Absolute: 0 10*3/uL (ref 0.0–0.1)
Basophils Relative: 0.5 % (ref 0.0–3.0)
Eosinophils Absolute: 0.2 10*3/uL (ref 0.0–0.7)
Eosinophils Relative: 3.5 % (ref 0.0–5.0)
HCT: 41.5 % (ref 39.0–52.0)
Hemoglobin: 13.8 g/dL (ref 13.0–17.0)
Lymphocytes Relative: 39.8 % (ref 12.0–46.0)
Lymphs Abs: 2.1 10*3/uL (ref 0.7–4.0)
MCHC: 33.2 g/dL (ref 30.0–36.0)
MCV: 84.7 fl (ref 78.0–100.0)
Monocytes Absolute: 0.5 10*3/uL (ref 0.1–1.0)
Monocytes Relative: 9.1 % (ref 3.0–12.0)
Neutro Abs: 2.5 10*3/uL (ref 1.4–7.7)
Neutrophils Relative %: 47.1 % (ref 43.0–77.0)
Platelets: 292 10*3/uL (ref 150.0–400.0)
RBC: 4.91 Mil/uL (ref 4.22–5.81)
RDW: 15.7 % — ABNORMAL HIGH (ref 11.5–15.5)
WBC: 5.2 10*3/uL (ref 4.0–10.5)

## 2023-07-28 LAB — IBC PANEL
Iron: 78 ug/dL (ref 42–165)
Saturation Ratios: 19.7 % — ABNORMAL LOW (ref 20.0–50.0)
TIBC: 396.2 ug/dL (ref 250.0–450.0)
Transferrin: 283 mg/dL (ref 212.0–360.0)

## 2023-07-28 LAB — BASIC METABOLIC PANEL WITH GFR
BUN: 21 mg/dL (ref 6–23)
CO2: 29 meq/L (ref 19–32)
Calcium: 9.6 mg/dL (ref 8.4–10.5)
Chloride: 102 meq/L (ref 96–112)
Creatinine, Ser: 1.18 mg/dL (ref 0.40–1.50)
GFR: 61.65 mL/min (ref >60.00)
Glucose, Bld: 67 mg/dL — ABNORMAL LOW (ref 70–99)
Potassium: 3.5 meq/L (ref 3.5–5.1)
Sodium: 144 meq/L (ref 135–145)

## 2023-07-28 LAB — PSA: PSA: 2.53 ng/mL (ref 0.10–4.00)

## 2023-08-03 ENCOUNTER — Ambulatory Visit (AMBULATORY_SURGERY_CENTER)

## 2023-08-03 VITALS — Ht 64.0 in | Wt 215.0 lb

## 2023-08-03 DIAGNOSIS — Z1211 Encounter for screening for malignant neoplasm of colon: Secondary | ICD-10-CM

## 2023-08-03 MED ORDER — NA SULFATE-K SULFATE-MG SULF 17.5-3.13-1.6 GM/177ML PO SOLN: 1.0000 | Freq: Once | ORAL | 0 refills | Status: AC

## 2023-08-03 NOTE — Progress Notes (Signed)

## 2023-08-10 ENCOUNTER — Other Ambulatory Visit: Payer: Self-pay | Admitting: Family

## 2023-08-21 ENCOUNTER — Other Ambulatory Visit: Payer: Self-pay | Admitting: Family

## 2023-08-21 DIAGNOSIS — I10 Essential (primary) hypertension: Secondary | ICD-10-CM

## 2023-08-25 ENCOUNTER — Ambulatory Visit (HOSPITAL_BASED_OUTPATIENT_CLINIC_OR_DEPARTMENT_OTHER)
Admission: RE | Admit: 2023-08-25 | Discharge: 2023-08-25 | Disposition: A | Discharge status: Home / Self Care | Source: Ambulatory Visit | Attending: Family | Admitting: Family

## 2023-08-25 DIAGNOSIS — I350 Nonrheumatic aortic (valve) stenosis: Secondary | ICD-10-CM | POA: Insufficient documentation

## 2023-08-26 ENCOUNTER — Encounter: Payer: Self-pay | Admitting: Family

## 2023-08-26 LAB — ECHOCARDIOGRAM COMPLETE
AR max vel: 2.61 cm2
AV Area VTI: 2.8 cm2
AV Area mean vel: 2.66 cm2
AV Mean grad: 5.3 mmHg
AV Peak grad: 10.1 mmHg
Ao pk vel: 1.59 m/s
Area-P 1/2: 3.5 cm2
Calc EF: 61 %
Single Plane A2C EF: 63.2 %
Single Plane A4C EF: 62 %

## 2023-09-02 ENCOUNTER — Ambulatory Visit (AMBULATORY_SURGERY_CENTER): Admitting: Gastroenterology

## 2023-09-02 ENCOUNTER — Encounter: Payer: Self-pay | Admitting: Gastroenterology

## 2023-09-02 VITALS — BP 164/88 | HR 73 | Temp 98.8°F | Resp 12 | Ht 64.0 in | Wt 215.0 lb

## 2023-09-02 DIAGNOSIS — K5730 Diverticulosis of large intestine without perforation or abscess without bleeding: Secondary | ICD-10-CM | POA: Diagnosis not present

## 2023-09-02 DIAGNOSIS — Z1211 Encounter for screening for malignant neoplasm of colon: Secondary | ICD-10-CM | POA: Diagnosis present

## 2023-09-02 DIAGNOSIS — K6389 Other specified diseases of intestine: Secondary | ICD-10-CM | POA: Diagnosis not present

## 2023-09-02 DIAGNOSIS — Z860101 Personal history of adenomatous and serrated colon polyps: Secondary | ICD-10-CM

## 2023-09-02 DIAGNOSIS — K562 Volvulus: Secondary | ICD-10-CM | POA: Diagnosis not present

## 2023-09-02 DIAGNOSIS — Z860100 Personal history of colon polyps, unspecified: Secondary | ICD-10-CM

## 2023-09-02 MED ORDER — SODIUM CHLORIDE 0.9 % IV SOLN: 500.0000 mL | Freq: Once | INTRAVENOUS | Status: DC

## 2023-09-02 NOTE — Op Note (Signed)
 [facility] Patient Name: Keith Hardin Procedure Date: 09/02/2023 1:36 PM MRN: [id number] Endoscopist: Geralyn Knee E. Cherryl Corona , MD, [id number] Age: 73 Referring MD:  Date of Birth: [birth year] Gender: Male Account #: [id number] Procedure:                Colonoscopy Indications:              Surveillance: Personal history of adenomatous                            polyps on last colonoscopy > 5 years ago Medicines:                Monitored Anesthesia Care Procedure:                Pre-Anesthesia Assessment:                           - Prior to the procedure, a History and Physical                            was performed, and patient medications and                            allergies were reviewed. The patient's tolerance of                            previous anesthesia was also reviewed. The risks                            and benefits of the procedure and the sedation                            options and risks were discussed with the patient.                            All questions were answered, and informed consent                            was obtained. Prior Anticoagulants: The patient has                            taken no anticoagulant or antiplatelet agents. ASA                            Grade Assessment: II - A patient with mild systemic                            disease. After reviewing the risks and benefits,                            the patient was deemed in satisfactory condition to                            undergo the procedure.  After obtaining informed consent, the colonoscope                            was passed under direct vision. Throughout the                            procedure, the patient's blood pressure, pulse, and                            oxygen saturations were monitored continuously. The                            Olympus Scope SN: [id number] was introduced through                            the anus and  advanced to the the cecum, identified                            by appendiceal orifice and ileocecal valve. The                            colonoscopy was somewhat difficult due to a                            redundant colon and significant looping. Successful                            completion of the procedure was aided by using                            manual pressure and withdrawing and reinserting the                            scope. The patient tolerated the procedure well.                            The quality of the bowel preparation was adequate.                            The ileocecal valve, appendiceal orifice, and                            rectum were photographed. The bowel preparation                            used was Miralax and SUPREP via extended prep with                            split dose instruction. Scope In: 1:39:03 PM Scope Out: 2:02:01 PM Scope Withdrawal Time: 0 hours 9 minutes 35 seconds  Total Procedure Duration: 0 hours 22 minutes 58 seconds  Findings:                 The perianal and digital rectal examinations  were                            normal. Pertinent negatives include normal                            sphincter tone and no palpable rectal lesions.                           A diffuse area of mild melanosis was found in the                            descending colon and in the transverse colon.                           The exam was otherwise normal throughout the                            examined colon.                           A single small-mouthed diverticulum was found in                            the ascending colon.                           The exam was otherwise normal throughout the                            examined colon.                           The retroflexed view of the distal rectum and anal                            verge was normal and showed no anal or rectal                             abnormalities. Complications:            No immediate complications. Estimated Blood Loss:     Estimated blood loss: none. Impression:               - Melanosis in the colon.                           - Diverticulosis in the ascending colon.                           - The distal rectum and anal verge are normal on                            retroflexion view.                           - No specimens collected. Recommendation:           -  Patient has a contact number available for                            emergencies. The signs and symptoms of potential                            delayed complications were discussed with the                            patient. Return to normal activities tomorrow.                            Written discharge instructions were provided to the                            patient.                           - Resume previous diet.                           - Continue present medications.                           - Given patient's age and lack of high risk polyps,                            I would recommend against further colon cancer                            screening. Nadeen Shipman E. Cherryl Corona, MD 09/02/2023 2:10:31 PM This report has been signed electronically.

## 2023-09-02 NOTE — Progress Notes (Signed)
 [facility] History and Physical   Primary Care Physician:  Dorrene Gaucher, NP   Reason for Procedure:   Colon cancer screening/polyp surveillance  Plan:    Surveillance colonoscopy     HPI: Keith Hardin is a 73 y.o. male undergoing surveillance colonoscopy.  He has no family history of colon cancer and no chronic GI symptoms.  His last colonoscopy was in 2018 and a single tubular adenoma was removed.   Past Medical History:  Diagnosis Date   Allergy    Colon polyps    Diabetes mellitus    type II   GERD (gastroesophageal reflux disease)    Glaucoma    Heart murmur    Hyperlipidemia    Hypertension    Hypogonadism male 05/02/2011   Orchitis and epididymitis 01/2008   left with left hydrocele    Past Surgical History:  Procedure Laterality Date   ACHILLES TENDON REPAIR     right    Prior to Admission medications   Medication Sig Start Date End Date Taking? Authorizing Provider  Ferrous Sulfate (IRON ) 325 (65 Fe) MG TABS Take 1 tablet (325 mg total) by mouth every other day. 12/16/22  Yes Dorrene Gaucher, NP  glucose blood (ACCU-CHEK AVIVA) test strip Use to test blood sugar three times daily. 01/15/16  Yes Dorrene Gaucher, NP  Insulin  Pen Needle (BD PEN NEEDLE NANO 2ND GEN) 32G X 4 MM MISC USE AS DIRECTED TO INJECT INSULIN  06/11/22  Yes O'Sullivan, Melissa, NP  Lancets (ACCU-CHEK MULTICLIX) lancets Use as directed 3 times daily to check blood sugar 01/12/13  Yes O'Sullivan, Melissa, NP  MOUNJARO 5 MG/0.5ML Pen SMARTSIG:5 Milligram(s) SUB-Q Once a Week   Yes [provider]  TRESIBA  FLEXTOUCH 100 UNIT/ML FlexTouch Pen Inject 30 Units into the skin daily. 12/15/22  Yes O'Sullivan, Melissa, NP  amLODipine  (NORVASC ) 5 MG tablet TAKE 1 TABLET (5 MG TOTAL) BY MOUTH DAILY. 08/21/23   O'Sullivan, Melissa, NP  aspirin 81 MG tablet Take 81 mg by mouth daily.    [provider]  augmented betamethasone  dipropionate (DIPROLENE -AF) 0.05 %  cream APPLY TOPICALLY TWICE A DAY Patient not taking: Reported on 09/02/2023 02/11/21   O'Sullivan, Melissa, NP  brimonidine (ALPHAGAN) 0.2 % ophthalmic solution INSTILL 1 DROP INTO AFFECTED EYE(S) BY OPHTHALMIC ROUTE EVERY 8 HOURS Patient not taking: Reported on 09/02/2023 09/09/17   [provider]  carvedilol (COREG) 12.5 MG tablet Take by mouth. 06/14/22   [provider]  cloNIDine  (CATAPRES ) 0.3 MG tablet TAKE 1 TABLET(0.3 MG) BY MOUTH TWICE DAILY 06/09/23   O'Sullivan, Melissa, NP  dapagliflozin propanediol (FARXIGA) 10 MG TABS tablet Take 10 mg by mouth daily. 08/20/16   [provider]  dorzolamide-timolol (COSOPT) 22.3-6.8 MG/ML ophthalmic solution 1 drop 2 (two) times daily. Patient not taking: Reported on 09/02/2023 11/25/19   [provider]  fexofenadine (ALLEGRA) 180 MG tablet Take 180 mg by mouth daily.    [provider]  fluticasone  (FLONASE ) 50 MCG/ACT nasal spray SPRAY 2 SPRAYS INTO EACH NOSTRIL EVERY DAY Patient not taking: Reported on 09/02/2023 03/20/23   O'Sullivan, Melissa, NP  furosemide  (LASIX ) 20 MG tablet TAKE 1 TABLET (20 MG TOTAL) BY MOUTH AS NEEDED. FOR SWELLING 06/09/23   O'Sullivan, Melissa, NP  glimepiride (AMARYL) 4 MG tablet Take by mouth. 10/06/18   [provider]  hydrochlorothiazide  (HYDRODIURIL ) 25 MG tablet TAKE 1 TABLET (25 MG TOTAL) BY MOUTH DAILY. 04/16/23   O'Sullivan, Melissa, NP  hydrOXYzine  (VISTARIL ) 25 MG capsule Take  1 capsule (25 mg total) by mouth every 8 (eight) hours as needed for itching. Patient not taking: Reported on 09/02/2023 11/22/22   Trenton Frock, PA-C  KLOR-CON  M20 20 MEQ tablet TAKE 1 TABLET BY MOUTH TWICE A DAY 04/16/23   O'Sullivan, Melissa, NP  metFORMIN  (GLUCOPHAGE ) 1000 MG tablet Take 1,000 mg by mouth 2 (two) times daily with a meal.    [provider]  Omega-3 Fatty Acids (FISH OIL) 1000 MG CAPS Take 1 capsule by mouth 2 (two) times daily.    [provider]   Omeprazole -Sodium Bicarbonate  (ZEGERID  OTC) 20-1100 MG CAPS capsule TAKE ONE CAPSULE BY MOUTH EVERY DAY BEFORE BREAKFAST 11/15/16   O'Sullivan, Melissa, NP  pioglitazone  (ACTOS ) 30 MG tablet TAKE 1 TABLET(30 MG) BY MOUTH DAILY 11/17/16   Copland, Skipper Dumas, MD  pravastatin  (PRAVACHOL ) 80 MG tablet TAKE 1 TABLET(80 MG) BY MOUTH ONCE DAILY 08/10/23   O'Sullivan, Melissa, NP  predniSONE  (DELTASONE ) 20 MG tablet Take 1 tablet (20 mg total) by mouth daily with breakfast. 12/03/22   Donnia Galas, Heidi Llamas, PA-C  tamsulosin  (FLOMAX ) 0.4 MG CAPS capsule Take 1 capsule (0.4 mg total) by mouth daily. 07/27/23   O'Sullivan, Melissa, NP    Current Outpatient Medications  Medication Sig Dispense Refill   Ferrous Sulfate (IRON ) 325 (65 Fe) MG TABS Take 1 tablet (325 mg total) by mouth every other day.     glucose blood (ACCU-CHEK AVIVA) test strip Use to test blood sugar three times daily. 50 each 1   Insulin  Pen Needle (BD PEN NEEDLE NANO 2ND GEN) 32G X 4 MM MISC USE AS DIRECTED TO INJECT INSULIN  100 each 4   Lancets (ACCU-CHEK MULTICLIX) lancets Use as directed 3 times daily to check blood sugar 102 each 2   MOUNJARO 5 MG/0.5ML Pen SMARTSIG:5 Milligram(s) SUB-Q Once a Week     TRESIBA  FLEXTOUCH 100 UNIT/ML FlexTouch Pen Inject 30 Units into the skin daily.     amLODipine  (NORVASC ) 5 MG tablet TAKE 1 TABLET (5 MG TOTAL) BY MOUTH DAILY. 90 tablet 1   aspirin 81 MG tablet Take 81 mg by mouth daily.     augmented betamethasone  dipropionate (DIPROLENE -AF) 0.05 % cream APPLY TOPICALLY TWICE A DAY (Patient not taking: Reported on 09/02/2023) 50 g 0   brimonidine (ALPHAGAN) 0.2 % ophthalmic solution INSTILL 1 DROP INTO AFFECTED EYE(S) BY OPHTHALMIC ROUTE EVERY 8 HOURS (Patient not taking: Reported on 09/02/2023)  6   carvedilol (COREG) 12.5 MG tablet Take by mouth.     cloNIDine  (CATAPRES ) 0.3 MG tablet TAKE 1 TABLET(0.3 MG) BY MOUTH TWICE DAILY 174 tablet 0   dapagliflozin propanediol (FARXIGA) 10 MG TABS tablet Take 10 mg by  mouth daily.     dorzolamide-timolol (COSOPT) 22.3-6.8 MG/ML ophthalmic solution 1 drop 2 (two) times daily. (Patient not taking: Reported on 09/02/2023)     fexofenadine (ALLEGRA) 180 MG tablet Take 180 mg by mouth daily.     fluticasone  (FLONASE ) 50 MCG/ACT nasal spray SPRAY 2 SPRAYS INTO EACH NOSTRIL EVERY DAY (Patient not taking: Reported on 09/02/2023) 48 mL 1   furosemide  (LASIX ) 20 MG tablet TAKE 1 TABLET (20 MG TOTAL) BY MOUTH AS NEEDED. FOR SWELLING 90 tablet 0   glimepiride (AMARYL) 4 MG tablet Take by mouth.     hydrochlorothiazide  (HYDRODIURIL ) 25 MG tablet TAKE 1 TABLET (25 MG TOTAL) BY MOUTH DAILY. 90 tablet 1   hydrOXYzine  (VISTARIL ) 25 MG capsule Take 1 capsule (25 mg total) by mouth every 8 (eight) hours as  needed for itching. (Patient not taking: Reported on 09/02/2023) 30 capsule 0   KLOR-CON  M20 20 MEQ tablet TAKE 1 TABLET BY MOUTH TWICE A DAY 180 tablet 1   metFORMIN  (GLUCOPHAGE ) 1000 MG tablet Take 1,000 mg by mouth 2 (two) times daily with a meal.     Omega-3 Fatty Acids (FISH OIL) 1000 MG CAPS Take 1 capsule by mouth 2 (two) times daily.     Omeprazole -Sodium Bicarbonate  (ZEGERID  OTC) 20-1100 MG CAPS capsule TAKE ONE CAPSULE BY MOUTH EVERY DAY BEFORE BREAKFAST 28 capsule 5   pioglitazone  (ACTOS ) 30 MG tablet TAKE 1 TABLET(30 MG) BY MOUTH DAILY 90 tablet 0   pravastatin  (PRAVACHOL ) 80 MG tablet TAKE 1 TABLET(80 MG) BY MOUTH ONCE DAILY 90 tablet 0   predniSONE  (DELTASONE ) 20 MG tablet Take 1 tablet (20 mg total) by mouth daily with breakfast. 5 tablet 0   tamsulosin  (FLOMAX ) 0.4 MG CAPS capsule Take 1 capsule (0.4 mg total) by mouth daily. 30 capsule 3   Current Facility-Administered Medications  Medication Dose Route Frequency Provider Last Rate Last Admin   0.9 %  sodium chloride  infusion  500 mL Intravenous Once Elois Hair, MD        Allergies as of 09/02/2023 - Review Complete 09/02/2023  Allergen Reaction Noted   Lisinopril  Anaphylaxis 12/15/2022    Family  History  Problem Relation Age of Onset   Seizures Mother    Hypertension Mother    Hypothyroidism Sister    Cancer Brother        stomach   Hypertension Brother    Esophageal cancer Maternal Aunt    Colon cancer Neg Hx    Diabetes Neg Hx    Heart attack Neg Hx    Hyperlipidemia Neg Hx    Sudden death Neg Hx    Colon polyps Neg Hx    Rectal cancer Neg Hx    Stomach cancer Neg Hx     Social History   Socioeconomic History   Marital status: Married    Spouse name: Marliss Simple   Number of children: 0   Years of education: Not on file   Highest education level: Not on file  Occupational History   Occupation: Designer, industrial/product: UNEMPLOYED  Tobacco Use   Smoking status: Never   Smokeless tobacco: Never  Vaping Use   Vaping status: Never Used  Substance and Sexual Activity   Alcohol use: No   Drug use: No   Sexual activity: Yes    Partners: Female  Other Topics Concern   Not on file  Social History Narrative   Occupation: works for state as Production designer, 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 : No  Physical Activity: Not on file  Stress: Not on file  Social Connections: Not on file  Intimate Partner Violence: Not on file  Review of Systems:  All other review of systems negative except as mentioned in the HPI.  Physical Exam: Vital signs BP (!) 143/73   Pulse 63   Temp 98.8 F (37.1 C)   Ht  5\' 4"  (1.626 m)   Wt 215 lb (97.5 kg)   SpO2 99%   BMI 36.90 kg/m   General:   Alert,  Well-developed, well-nourished, pleasant and cooperative in NAD Airway:  Mallampati 2 Lungs:  Clear throughout to auscultation.   Heart:  Regular rate and rhythm; no murmurs, clicks, rubs,  or gallops. Abdomen:  Soft, nontender and nondistended. Normal bowel sounds.   Neuro/Psych:  Normal mood and affect. A and O x 3   Niajah Sipos E. Cherryl Corona, MD [facility]

## 2023-09-02 NOTE — Progress Notes (Signed)
 Pt arrived for colonoscopy with blood sugar 55.  Pt reports no symptoms. Per policy 1/2 amp D 50 given IV.  D 5 hung at kvo.  Blood sugar rechecked at 125 pm reading 91.

## 2023-09-02 NOTE — Progress Notes (Signed)
 Sedate, gd SR, tolerated procedure well, VSS, report to RN

## 2023-09-02 NOTE — Patient Instructions (Signed)
 Thank you for letting us care for your healthcare needs today! Please see handout regarding Diverticulosis.  YOU HAD AN ENDOSCOPIC PROCEDURE TODAY AT THE [facility]:   Refer to the procedure report that was given to you for any specific questions about what was found during the examination.  If the procedure report does not answer your questions, please call your gastroenterologist to clarify.  If you requested that your care partner not be given the details of your procedure findings, then the procedure report has been included in a sealed envelope for you to review at your convenience later.  YOU SHOULD EXPECT: Some feelings of bloating in the abdomen. Passage of more gas than usual.  Walking can help get rid of the air that was put into your GI tract during the procedure and reduce the bloating. If you had a lower endoscopy (such as a colonoscopy or flexible sigmoidoscopy) you may notice spotting of blood in your stool or on the toilet paper. If you underwent a bowel prep for your procedure, you may not have a normal bowel movement for a few days.  Please Note:  You might notice some irritation and congestion in your nose or some drainage.  This is from the oxygen used during your procedure.  There is no need for concern and it should clear up in a day or so.  SYMPTOMS TO REPORT IMMEDIATELY:  Following lower endoscopy (colonoscopy or flexible sigmoidoscopy):  Excessive amounts of blood in the stool  Significant tenderness or worsening of abdominal pains  Swelling of the abdomen that is new, acute  Fever of 100F or higher  For urgent or emergent issues, a gastroenterologist can be reached at any hour by calling [phone]. Do not use MyChart messaging for urgent concerns.    DIET:  We do recommend a small meal at first, but then you may proceed to your regular diet.  Drink plenty of fluids but you should avoid alcoholic beverages for 24 hours.  ACTIVITY:  You should plan to  take it easy for the rest of today and you should NOT DRIVE or use heavy machinery until tomorrow (because of the sedation medicines used during the test).    FOLLOW UP: Our staff will call the number listed on your records the next business day following your procedure.  We will call around 7:15- 8:00 am to check on you and address any questions or concerns that you may have regarding the information given to you following your procedure. If we do not reach you, we will leave a message.     If any biopsies were taken you will be contacted by phone or by letter within the next 1-3 weeks.  Please call us at [phone] if you have not heard about the biopsies in 3 weeks.    SIGNATURES/CONFIDENTIALITY: You and/or your care partner have signed paperwork which will be entered into your electronic medical record.  These signatures attest to the fact that that the information above on your After Visit Summary has been reviewed and is understood.  Full responsibility of the confidentiality of this discharge information lies with you and/or your care-partner.

## 2023-09-02 NOTE — Progress Notes (Signed)
 Pt's states no medical or surgical changes since previsit or office visit.

## 2023-09-05 ENCOUNTER — Telehealth: Payer: Self-pay

## 2023-09-05 NOTE — Telephone Encounter (Signed)
  Follow up Call-     09/02/2023   12:56 PM  Call back number  Post procedure Call Back phone  # [phone]  Permission to leave phone message Yes     Patient questions:  Do you have a fever, pain , or abdominal swelling? No. Pain Score  0 *  Have you tolerated food without any problems? Yes.    Have you been able to return to your normal activities? Yes.    Do you have any questions about your discharge instructions: Diet   No. Medications  No. Follow up visit  No.  Do you have questions or concerns about your Care? No.  Actions: * If pain score is 4 or above: No action needed, pain <4.

## 2023-09-23 ENCOUNTER — Other Ambulatory Visit: Payer: Self-pay | Admitting: Family

## 2023-09-23 DIAGNOSIS — N401 Enlarged prostate with lower urinary tract symptoms: Secondary | ICD-10-CM

## 2023-09-23 DIAGNOSIS — I10 Essential (primary) hypertension: Secondary | ICD-10-CM

## 2023-10-01 ENCOUNTER — Other Ambulatory Visit: Payer: Self-pay | Admitting: Family

## 2023-10-24 LAB — HEMOGLOBIN A1C: Hemoglobin A1C: 7.8

## 2023-11-21 ENCOUNTER — Other Ambulatory Visit: Payer: Self-pay | Admitting: Family

## 2024-01-18 ENCOUNTER — Other Ambulatory Visit: Payer: Self-pay | Admitting: Family

## 2024-02-01 ENCOUNTER — Ambulatory Visit: Admitting: Family

## 2024-02-01 VITALS — BP 128/58 | HR 61 | Temp 98.8°F | Resp 16 | Ht 64.0 in | Wt 213.0 lb

## 2024-02-01 DIAGNOSIS — E119 Type 2 diabetes mellitus without complications: Secondary | ICD-10-CM

## 2024-02-01 DIAGNOSIS — R600 Localized edema: Secondary | ICD-10-CM | POA: Diagnosis not present

## 2024-02-01 DIAGNOSIS — I10 Essential (primary) hypertension: Secondary | ICD-10-CM | POA: Diagnosis not present

## 2024-02-01 DIAGNOSIS — N401 Enlarged prostate with lower urinary tract symptoms: Secondary | ICD-10-CM

## 2024-02-01 DIAGNOSIS — Z23 Encounter for immunization: Secondary | ICD-10-CM | POA: Diagnosis not present

## 2024-02-01 DIAGNOSIS — E7800 Pure hypercholesterolemia, unspecified: Secondary | ICD-10-CM

## 2024-02-01 MED ORDER — CARVEDILOL 12.5 MG PO TABS: 12.5000 mg | ORAL_TABLET | Freq: Two times a day (BID) | ORAL | 1 refills | Status: DC

## 2024-02-01 MED ORDER — HYDROCHLOROTHIAZIDE 25 MG PO TABS: 25.0000 mg | ORAL_TABLET | Freq: Every day | ORAL | 1 refills | Status: AC

## 2024-02-01 MED ORDER — POTASSIUM CHLORIDE CRYS ER 20 MEQ PO TBCR: 20.0000 meq | EXTENDED_RELEASE_TABLET | Freq: Two times a day (BID) | ORAL | 1 refills | Status: AC

## 2024-02-01 MED ORDER — TAMSULOSIN HCL 0.4 MG PO CAPS: 0.4000 mg | ORAL_CAPSULE | Freq: Every day | ORAL | 1 refills | Status: AC

## 2024-02-01 MED ORDER — FUROSEMIDE 20 MG PO TABS: 40.0000 mg | ORAL_TABLET | Freq: Every day | ORAL | 1 refills | Status: AC

## 2024-02-01 MED ORDER — CLONIDINE HCL 0.3 MG PO TABS: 0.3000 mg | ORAL_TABLET | Freq: Two times a day (BID) | ORAL | 0 refills | Status: AC

## 2024-02-01 MED ORDER — PRAVASTATIN SODIUM 80 MG PO TABS: ORAL_TABLET | ORAL | 1 refills | Status: AC

## 2024-02-01 MED ORDER — AMLODIPINE BESYLATE 5 MG PO TABS: 5.0000 mg | ORAL_TABLET | Freq: Every day | ORAL | 1 refills | Status: AC

## 2024-02-01 NOTE — Assessment & Plan Note (Signed)
 Voiding is stable on flomax .

## 2024-02-01 NOTE — Assessment & Plan Note (Signed)
 Lab Results  Component Value Date   CHOL 170 12/15/2022   HDL 46.60 12/15/2022   LDLCALC 81 12/22/2021   LDLDIRECT 91.0 12/15/2022   TRIG 206.0 (H) 12/15/2022   CHOLHDL 4 12/15/2022   Continues pravastatin - will need to add on lipid panel to bmet.  Las LDL was nearly at goal but due for repeat.

## 2024-02-01 NOTE — Patient Instructions (Signed)
 VISIT SUMMARY:  Today, we reviewed your diabetes and hypertension management, addressed your swelling and urinary issues, and discussed your low platelet count. You also received a flu shot and were advised to consider a COVID booster.  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 -Please visit the ER for further evaluation.  TYPE 2 DIABETES MELLITUS: Your A1c level is elevated at 7.8, which is above the target range. -Continue taking your current diabetes medications: Farxiga, Tresiba  at 30 units, metformin , Actos , and Mounjaro.  EDEMA: You experience swelling that requires two furosemide  pills daily. -Take two furosemide  pills daily as prescribed. -Wear non-tight socks to help manage the swelling.  HYPERTENSION: Your blood pressure is well-controlled at 128/58 with your current medications. -Continue taking your current antihypertensive medications: amlodipine , carvedilol, and clonidine .  URINARY HESITANCY: You occasionally feel a blockage when urinating. -Continue taking Flomax  as prescribed.  ENCOUNTER FOR IMMUNIZATION: You are due for a flu shot and considering a COVID booster due to your age and diabetes. -You received a flu shot today. -Consider getting a COVID booster at the pharmacy.

## 2024-02-01 NOTE — Progress Notes (Signed)
 Subjective:     Patient ID: Keith Hardin, male    DOB: [birth year], 73 y.o.   MRN: [id number]  Chief Complaint  Patient presents with   Diabetes    Here for follow up   Hypertension    Here for follow up    Diabetes  Hypertension    Discussed the use of AI scribe software for clinical note transcription with the patient, who gave verbal consent to proceed.  History of Present Illness  COUGAR IMEL is a 73 year old male with diabetes and hypertension who presents for a follow-up on his medications.  His diabetes management includes an A1c of 7.8. He adheres to his diet and takes Farxiga, Tresiba  at 30 units, metformin , Actos , and Mounjaro. He has experienced low blood sugar levels, with the lowest being 79, but no significant hypoglycemic symptoms.  Hypertension is managed with amlodipine  5 mg, carvedilol 12.5 mg, and clonidine . He takes furosemide , usually two pills a day, for swelling due to prolonged standing. Swelling persists with only one pill. No nocturia when taking furosemide  at night.  He experiences occasional difficulty with urination, feeling as if something is blocking his flow, but he can eventually empty his bladder. He is on Flomax  for this issue.  He takes iron  every other day without constipation. He was informed of a low platelet count, significantly below normal. He recently bumped his arm on a guard rail without infection or other issues. No recent allergy symptoms or issues with urination frequency or urgency.     Health Maintenance Due  Topic Date Due   Diabetic kidney evaluation - Urine ACR  12/22/2013   OPHTHALMOLOGY EXAM  01/21/2023   DTaP/Tdap/Td (2 - Td or Tdap) 04/07/2023   COVID-19 Vaccine (6 - 2025-26 season) 12/26/2023    Past Medical History:  Diagnosis Date   Allergy    Colon polyps    Diabetes mellitus    type II   GERD (gastroesophageal reflux disease)    Glaucoma    Heart murmur    Hyperlipidemia    Hypertension     Hypogonadism male 05/02/2011   Orchitis and epididymitis 01/2008   left with left hydrocele    Past Surgical History:  Procedure Laterality Date   ACHILLES TENDON REPAIR     right    Family History  Problem Relation Age of Onset   Seizures Mother    Hypertension Mother    Hypothyroidism Sister    Cancer Brother        stomach   Hypertension Brother    Esophageal cancer Maternal Aunt    Colon cancer Neg Hx    Diabetes Neg Hx    Heart attack Neg Hx    Hyperlipidemia Neg Hx    Sudden death Neg Hx    Colon polyps Neg Hx    Rectal cancer Neg Hx    Stomach cancer Neg Hx     Social History   Socioeconomic History   Marital status: Married    Spouse name: Bernice   Number of children: 0   Years of education: Not on file   Highest education level: Not on file  Occupational History   Occupation: Unemployed    Employer: UNEMPLOYED  Tobacco Use   Smoking status: Never   Smokeless tobacco: Never  Vaping Use   Vaping status: Never Used  Substance and Sexual Activity   Alcohol use: No   Drug use: No   Sexual activity: Yes    Partners:  Female  Other Topics Concern   Not on file  Social History Narrative   Occupation: works for state as Production designer, 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 : Never true  Transportation Needs: No Transportation Needs (12/24/2022)   Received from [facility]    In the past 12 months, has lack of reliable transportation kept you from medical appointments, meetings, work or from getting things needed for daily living? :  No  Physical Activity: Not on file  Stress: Not on file  Social Connections: Not on file  Intimate Partner Violence: Not on file    Outpatient Medications Prior to Visit  Medication Sig Dispense Refill   aspirin 81 MG tablet Take 81 mg by mouth daily.     dapagliflozin propanediol (FARXIGA) 10 MG TABS tablet Take 10 mg by mouth daily.     Ferrous Sulfate (IRON ) 325 (65 Fe) MG TABS Take 1 tablet (325 mg total) by mouth every other day.     fexofenadine (ALLEGRA) 180 MG tablet Take 180 mg by mouth daily.     fluticasone  (FLONASE ) 50 MCG/ACT nasal spray SPRAY 2 SPRAYS INTO EACH NOSTRIL EVERY DAY 48 mL 1   glimepiride (AMARYL) 4 MG tablet Take by mouth.     glucose blood (ACCU-CHEK AVIVA) test strip Use to test blood sugar three times daily. 50 each 1   Insulin  Pen Needle (BD PEN NEEDLE NANO 2ND GEN) 32G X 4 MM MISC USE AS DIRECTED TO INJECT INSULIN  100 each 4   Lancets (ACCU-CHEK MULTICLIX) lancets Use as directed 3 times daily to check blood sugar 102 each 2   metFORMIN  (GLUCOPHAGE ) 1000 MG tablet Take 1,000 mg by mouth 2 (two) times daily with a meal.     MOUNJARO 5 MG/0.5ML Pen SMARTSIG:5 Milligram(s) SUB-Q Once a Week     Omega-3 Fatty Acids (FISH OIL) 1000 MG CAPS Take 1 capsule by mouth 2 (two) times daily.     Omeprazole -Sodium Bicarbonate  (ZEGERID  OTC) 20-1100 MG CAPS capsule TAKE ONE CAPSULE BY MOUTH EVERY DAY BEFORE BREAKFAST 28 capsule 5   pioglitazone  (ACTOS ) 30 MG tablet TAKE 1 TABLET(30 MG) BY MOUTH DAILY 90 tablet 0   TRESIBA  FLEXTOUCH 100 UNIT/ML FlexTouch Pen Inject 30 Units into the skin daily.     amLODipine  (NORVASC ) 5 MG tablet TAKE 1 TABLET (5 MG TOTAL) BY MOUTH DAILY. 90 tablet 1   carvedilol (COREG) 12.5 MG tablet Take by mouth.     cloNIDine  (CATAPRES ) 0.3 MG tablet Take 1 tablet (0.3 mg total) by mouth 2 (two) times daily. 180 tablet 0   furosemide  (LASIX ) 20 MG tablet TAKE 1 TABLET (20 MG TOTAL) BY MOUTH AS NEEDED. FOR SWELLING 90 tablet 1   hydrochlorothiazide   (HYDRODIURIL ) 25 MG tablet TAKE 1 TABLET (25 MG TOTAL) BY MOUTH DAILY. 90 tablet 1   potassium chloride  SA (KLOR-CON  M) 20 MEQ tablet TAKE 1 TABLET BY MOUTH TWICE A DAY 180 tablet 1   pravastatin  (  PRAVACHOL ) 80 MG tablet TAKE 1 TABLET(80 MG) BY MOUTH ONCE DAILY 90 tablet 0   predniSONE  (DELTASONE ) 20 MG tablet Take 1 tablet (20 mg total) by mouth daily with breakfast. 5 tablet 0   tamsulosin  (FLOMAX ) 0.4 MG CAPS capsule TAKE 1 CAPSULE BY MOUTH EVERY DAY 90 capsule 1   augmented betamethasone  dipropionate (DIPROLENE -AF) 0.05 % cream APPLY TOPICALLY TWICE A DAY (Patient not taking: Reported on 09/02/2023) 50 g 0   brimonidine (ALPHAGAN) 0.2 % ophthalmic solution INSTILL 1 DROP INTO AFFECTED EYE(S) BY OPHTHALMIC ROUTE EVERY 8 HOURS (Patient not taking: Reported on 09/02/2023)  6   dorzolamide-timolol (COSOPT) 22.3-6.8 MG/ML ophthalmic solution 1 drop 2 (two) times daily. (Patient not taking: Reported on 09/02/2023)     hydrOXYzine  (VISTARIL ) 25 MG capsule Take 1 capsule (25 mg total) by mouth every 8 (eight) hours as needed for itching. (Patient not taking: Reported on 09/02/2023) 30 capsule 0   No facility-administered medications prior to visit.    Allergies  Allergen Reactions   Lisinopril  Anaphylaxis    Angioedema/facial swelling    ROS    See HPI Objective:    Physical Exam Constitutional:      General: He is not in acute distress.    Appearance: He is well-developed.  HENT:     Head: Normocephalic and atraumatic.  Cardiovascular:     Rate and Rhythm: Normal rate and regular rhythm.     Heart sounds: No murmur heard. Pulmonary:     Effort: Pulmonary effort is normal. No respiratory distress.     Breath sounds: Normal breath sounds. No wheezing or rales.  Musculoskeletal:        General: No swelling.  Skin:    General: Skin is warm and dry.  Neurological:     Mental Status: He is alert and oriented to person, place, and time.  Psychiatric:        Behavior: Behavior normal.         Thought Content: Thought content normal.      BP (!) 128/58 (BP Location: Right Arm, Patient Position: Sitting, Cuff Size: Large)   Pulse 61   Temp 98.8 F (37.1 C) (Oral)   Resp 16   Ht 5' 4 (1.626 m)   Wt 213 lb (96.6 kg)   SpO2 99%   BMI 36.56 kg/m  Wt Readings from Last 3 Encounters:  02/01/24 213 lb (96.6 kg)  09/02/23 215 lb (97.5 kg)  08/03/23 215 lb (97.5 kg)       Assessment & Plan:   Problem List Items Addressed This Visit       Unprioritized   Hyperlipidemia   Lab Results  Component Value Date   CHOL 170 12/15/2022   HDL 46.60 12/15/2022   LDLCALC 81 12/22/2021   LDLDIRECT 91.0 12/15/2022   TRIG 206.0 (H) 12/15/2022   CHOLHDL 4 12/15/2022   Continues pravastatin - will need to add on lipid panel to [facility].  Las LDL was nearly at goal but due for repeat.       Relevant Medications   furosemide  (LASIX ) 20 MG tablet   hydrochlorothiazide  (HYDRODIURIL ) 25 MG tablet   amLODipine  (NORVASC ) 5 MG tablet   pravastatin  (PRAVACHOL ) 80 MG tablet   cloNIDine  (CATAPRES ) 0.3 MG tablet   carvedilol (COREG) 12.5 MG tablet   Essential hypertension   BP Readings from Last 3 Encounters:  02/01/24 (!) 128/58  09/02/23 (!) 164/88  07/27/23 (!) 134/56   Stable on current meds. Continue same.  Relevant Medications   furosemide  (LASIX ) 20 MG tablet   hydrochlorothiazide  (HYDRODIURIL ) 25 MG tablet   amLODipine  (NORVASC ) 5 MG tablet   pravastatin  (PRAVACHOL ) 80 MG tablet   potassium chloride  SA (KLOR-CON  M) 20 MEQ tablet   cloNIDine  (CATAPRES ) 0.3 MG tablet   carvedilol (COREG) 12.5 MG tablet   Diabetes type 2, controlled (HCC)   Lab Results  Component Value Date   HGBA1C 7.8 10/24/2023   HGBA1C 7.7 (H) 12/15/2022   HGBA1C 6.9 07/24/2020   Lab Results  Component Value Date   MICROALBUR 0.50 10/29/2013   LDLCALC 81 12/22/2021   CREATININE 1.18 07/27/2023   Has been above goal- management per endo.        Relevant Medications   pravastatin   (PRAVACHOL ) 80 MG tablet   Other Relevant Orders   Urine Microalbumin w/creat. ratio   Basic Metabolic Panel (BMET)   HgB A1c   Benign prostatic hyperplasia with lower urinary tract symptoms   Voiding is stable on flomax .       Relevant Medications   tamsulosin  (FLOMAX ) 0.4 MG CAPS capsule   Other Visit Diagnoses       Needs flu shot    -  Primary   Relevant Orders   Flu vaccine HIGH DOSE PF(Fluzone Trivalent) (Completed)     Lower extremity edema       Relevant Medications   furosemide  (LASIX ) 20 MG tablet       I have discontinued Benuel E. Feasel's brimonidine, dorzolamide-timolol, augmented betamethasone  dipropionate, hydrOXYzine , and predniSONE . I have also changed his furosemide , tamsulosin , potassium chloride  SA, and carvedilol. Additionally, I am having him maintain his aspirin, accu-chek multiclix, Fish Oil, fexofenadine, glucose blood, dapagliflozin propanediol, Omeprazole -Sodium Bicarbonate , pioglitazone , glimepiride, metFORMIN , BD Pen Needle Nano 2nd Gen, Tresiba  FlexTouch, Iron , fluticasone , Mounjaro, hydrochlorothiazide , amLODipine , pravastatin , and cloNIDine .  Meds ordered this encounter  Medications   furosemide  (LASIX ) 20 MG tablet    Sig: Take 2 tablets (40 mg total) by mouth daily. For swelling    Dispense:  180 tablet    Refill:  1    Supervising Provider:   DOMENICA BLACKBIRD A [[id number]]   tamsulosin  (FLOMAX ) 0.4 MG CAPS capsule    Sig: Take 1 capsule (0.4 mg total) by mouth daily.    Dispense:  90 capsule    Refill:  1    Supervising Provider:   DOMENICA BLACKBIRD A [[id number]]   hydrochlorothiazide  (HYDRODIURIL ) 25 MG tablet    Sig: Take 1 tablet (25 mg total) by mouth daily.    Dispense:  90 tablet    Refill:  1    Supervising Provider:   DOMENICA BLACKBIRD A [[id number]]   amLODipine  (NORVASC ) 5 MG tablet    Sig: Take 1 tablet (5 mg total) by mouth daily.    Dispense:  90 tablet    Refill:  1    Supervising Provider:   DOMENICA BLACKBIRD A [[id number]]   pravastatin  (PRAVACHOL ) 80  MG tablet    Sig: TAKE 1 TABLET(80 MG) BY MOUTH ONCE DAILY    Dispense:  90 tablet    Refill:  1    Supervising Provider:   DOMENICA BLACKBIRD A [[id number]]   potassium chloride  SA (KLOR-CON  M) 20 MEQ tablet    Sig: Take 1 tablet (20 mEq total) by mouth 2 (two) times daily.    Dispense:  180 tablet    Refill:  1    Supervising Provider:   DOMENICA BLACKBIRD A [[id number]]   cloNIDine  (CATAPRES ) 0.3 MG tablet  Sig: Take 1 tablet (0.3 mg total) by mouth 2 (two) times daily.    Dispense:  180 tablet    Refill:  0    Supervising Provider:   DOMENICA BLACKBIRD A [[id number]]   carvedilol (COREG) 12.5 MG tablet    Sig: Take 1 tablet (12.5 mg total) by mouth 2 (two) times daily with a meal.    Dispense:  180 tablet    Refill:  1    Supervising Provider:   DOMENICA BLACKBIRD A [[id number]]

## 2024-02-01 NOTE — Assessment & Plan Note (Signed)
 BP Readings from Last 3 Encounters:  02/01/24 (!) 128/58  09/02/23 (!) 164/88  07/27/23 (!) 134/56   Stable on current meds. Continue same.

## 2024-02-01 NOTE — Assessment & Plan Note (Addendum)
 Lab Results  Component Value Date   HGBA1C 7.8 10/24/2023   HGBA1C 7.7 (H) 12/15/2022   HGBA1C 6.9 07/24/2020   Lab Results  Component Value Date   MICROALBUR 0.50 10/29/2013   LDLCALC 81 12/22/2021   CREATININE 1.18 07/27/2023   Has been above goal- management per endo.

## 2024-02-01 NOTE — Assessment & Plan Note (Signed)
 Lab Results  Component Value Date   WBC 5.2 07/27/2023   HGB 13.8 07/27/2023   HCT 41.5 07/27/2023   MCV 84.7 07/27/2023   PLT 292.0 07/27/2023

## 2024-02-02 ENCOUNTER — Ambulatory Visit: Payer: Self-pay | Admitting: Family

## 2024-02-02 ENCOUNTER — Other Ambulatory Visit

## 2024-02-02 DIAGNOSIS — E7800 Pure hypercholesterolemia, unspecified: Secondary | ICD-10-CM | POA: Diagnosis not present

## 2024-02-02 LAB — MICROALBUMIN / CREATININE URINE RATIO
Creatinine,U: 72.9 mg/dL
Microalb Creat Ratio: 21.5 mg/g (ref 0.0–30.0)
Microalb, Ur: 1.6 mg/dL (ref 0.0–1.9)

## 2024-02-02 LAB — BASIC METABOLIC PANEL WITH GFR
BUN: 18 mg/dL (ref 6–23)
CO2: 30 meq/L (ref 19–32)
Calcium: 9.2 mg/dL (ref 8.4–10.5)
Chloride: 99 meq/L (ref 96–112)
Creatinine, Ser: 1.18 mg/dL (ref 0.40–1.50)
GFR: 61.42 mL/min (ref >60.00)
Glucose, Bld: 102 mg/dL — ABNORMAL HIGH (ref 70–99)
Potassium: 3.4 meq/L — ABNORMAL LOW (ref 3.5–5.1)
Sodium: 143 meq/L (ref 135–145)

## 2024-02-02 LAB — LIPID PANEL
Cholesterol: 163 mg/dL (ref 0–200)
HDL: 58.1 mg/dL (ref >39.00)
LDL Cholesterol: 67 mg/dL (ref 0–99)
NonHDL: 104.59
Total CHOL/HDL Ratio: 3
Triglycerides: 189 mg/dL — ABNORMAL HIGH (ref 0.0–149.0)
VLDL: 37.8 mg/dL (ref 0.0–40.0)

## 2024-02-02 LAB — HEMOGLOBIN A1C: Hgb A1c MFr Bld: 7.3 % — ABNORMAL HIGH (ref 4.6–6.5)

## 2024-02-02 NOTE — Telephone Encounter (Signed)
 Potassium is a little low.  Make sure to take Kdur twice daily and avoid missing doses.  A1C is improving at 7.3.  He should keep scheduled appointment with endocrinology.

## 2024-02-06 ENCOUNTER — Telehealth: Payer: Self-pay

## 2024-02-06 NOTE — Telephone Encounter (Signed)
 Called pt was advised it not new medication, Pt stated understand, he will look at his medication when get home.

## 2024-02-06 NOTE — Telephone Encounter (Signed)
 Copied from CRM #[id number]. Topic: Clinical - Medication Question >> Feb 03, 2024  4:21 PM Keith Hardin DEL wrote: Reason for CRM: Patient called in stating that he was not aware that he was going to start taking carvedilol (COREG) 12.5 MG tablet.  He stated that provider said that his BP was good,so he's wanting to know why he was started on another BP medication.

## 2024-02-07 ENCOUNTER — Telehealth: Payer: Self-pay | Admitting: Family

## 2024-02-07 NOTE — Telephone Encounter (Signed)
 Error/Investigation Details: Patient called in stating he had been prescribed the Carvedilol and this was a new medication and he was unaware why he had been put on it despite office and pharmacy telling him he has been on it for a while. He stated that the medication was added to the wrong chart and it should not be on his. Please call patient back to advise as he is still unclear on why this is in his chart.   Team Lead will continue to investigate why the Pink Word encounter and triage note were not attached to telephone encounter for possible coaching opportunities. [employer] JTAPI ID: [id number]

## 2024-02-07 NOTE — Telephone Encounter (Signed)
 Error CRM sent. No triage information to review?

## 2024-02-07 NOTE — Telephone Encounter (Signed)
Routing to PCP office for review.

## 2024-02-07 NOTE — Telephone Encounter (Signed)
 Noted. I have removed the medication from his list.

## 2024-02-07 NOTE — Telephone Encounter (Signed)
 Copied from CRM #[id number]. Topic: Clinical - Pink Word Triage >> Feb 07, 2024  1:40 PM Eva FALCON wrote: [place] Word triggered transfer to Nurse Triage. See Triage Message for details.

## 2024-08-01 ENCOUNTER — Ambulatory Visit: Admitting: Family
